# Patient Record
Sex: Female | Born: 1935 | Race: Black or African American | Hispanic: No | Smoking: Former smoker
Health system: Southern US, Community
[De-identification: ages and names within clinical notes are randomized; demographics above are authoritative.]

## PROBLEM LIST (undated history)

## (undated) DIAGNOSIS — H547 Unspecified visual loss: Secondary | ICD-10-CM

## (undated) DIAGNOSIS — H269 Unspecified cataract: Secondary | ICD-10-CM

## (undated) DIAGNOSIS — T7840XA Allergy, unspecified, initial encounter: Secondary | ICD-10-CM

## (undated) DIAGNOSIS — H401190 Primary open-angle glaucoma, unspecified eye, stage unspecified: Secondary | ICD-10-CM

## (undated) DIAGNOSIS — N189 Chronic kidney disease, unspecified: Secondary | ICD-10-CM

## (undated) DIAGNOSIS — M7071 Other bursitis of hip, right hip: Secondary | ICD-10-CM

## (undated) DIAGNOSIS — E079 Disorder of thyroid, unspecified: Secondary | ICD-10-CM

## (undated) DIAGNOSIS — F419 Anxiety disorder, unspecified: Secondary | ICD-10-CM

## (undated) DIAGNOSIS — M1990 Unspecified osteoarthritis, unspecified site: Secondary | ICD-10-CM

## (undated) DIAGNOSIS — I129 Hypertensive chronic kidney disease with stage 1 through stage 4 chronic kidney disease, or unspecified chronic kidney disease: Secondary | ICD-10-CM

## (undated) DIAGNOSIS — I10 Essential (primary) hypertension: Secondary | ICD-10-CM

## (undated) DIAGNOSIS — L89623 Pressure ulcer of left heel, stage 3: Secondary | ICD-10-CM

## (undated) DIAGNOSIS — M109 Gout, unspecified: Secondary | ICD-10-CM

## (undated) DIAGNOSIS — M6281 Muscle weakness (generalized): Secondary | ICD-10-CM

## (undated) DIAGNOSIS — F329 Major depressive disorder, single episode, unspecified: Secondary | ICD-10-CM

## (undated) DIAGNOSIS — I872 Venous insufficiency (chronic) (peripheral): Secondary | ICD-10-CM

## (undated) DIAGNOSIS — R54 Age-related physical debility: Secondary | ICD-10-CM

## (undated) DIAGNOSIS — R2689 Other abnormalities of gait and mobility: Secondary | ICD-10-CM

## (undated) DIAGNOSIS — F0390 Unspecified dementia without behavioral disturbance: Secondary | ICD-10-CM

## (undated) DIAGNOSIS — E538 Deficiency of other specified B group vitamins: Secondary | ICD-10-CM

## (undated) DIAGNOSIS — E43 Unspecified severe protein-calorie malnutrition: Secondary | ICD-10-CM

## (undated) DIAGNOSIS — F32A Depression, unspecified: Secondary | ICD-10-CM

## (undated) DIAGNOSIS — E876 Hypokalemia: Secondary | ICD-10-CM

## (undated) DIAGNOSIS — Z7409 Other reduced mobility: Secondary | ICD-10-CM

## (undated) DIAGNOSIS — D649 Anemia, unspecified: Secondary | ICD-10-CM

## (undated) DIAGNOSIS — R413 Other amnesia: Secondary | ICD-10-CM

## (undated) DIAGNOSIS — Z9181 History of falling: Secondary | ICD-10-CM

## (undated) DIAGNOSIS — G709 Myoneural disorder, unspecified: Secondary | ICD-10-CM

## (undated) DIAGNOSIS — M8000XD Age-related osteoporosis with current pathological fracture, unspecified site, subsequent encounter for fracture with routine healing: Secondary | ICD-10-CM

## (undated) HISTORY — DX: Hypokalemia: E87.6

## (undated) HISTORY — DX: Unspecified dementia without behavioral disturbance: F03.90

## (undated) HISTORY — PX: PARTIAL HYSTERECTOMY: SHX80

## (undated) HISTORY — DX: Unspecified cataract: H26.9

## (undated) HISTORY — DX: Other bursitis of hip, right hip: M70.71

## (undated) HISTORY — DX: Deficiency of other specified B group vitamins: E53.8

## (undated) HISTORY — DX: Muscle weakness (generalized): M62.81

## (undated) HISTORY — DX: Disorder of thyroid, unspecified: E07.9

## (undated) HISTORY — DX: Primary open-angle glaucoma, unspecified eye, stage unspecified: H40.1190

## (undated) HISTORY — DX: Age-related physical debility: R54

## (undated) HISTORY — DX: Myoneural disorder, unspecified: G70.9

## (undated) HISTORY — DX: Depression, unspecified: F32.A

## (undated) HISTORY — DX: History of falling: Z91.81

## (undated) HISTORY — DX: Hypomagnesemia: E83.42

## (undated) HISTORY — DX: Gout, unspecified: M10.9

## (undated) HISTORY — DX: Anxiety disorder, unspecified: F41.9

## (undated) HISTORY — DX: Unspecified osteoarthritis, unspecified site: M19.90

## (undated) HISTORY — DX: Hypertensive chronic kidney disease with stage 1 through stage 4 chronic kidney disease, or unspecified chronic kidney disease: I12.9

## (undated) HISTORY — DX: Unspecified visual loss: H54.7

## (undated) HISTORY — DX: Other reduced mobility: Z74.09

## (undated) HISTORY — DX: Venous insufficiency (chronic) (peripheral): I87.2

## (undated) HISTORY — DX: Unspecified severe protein-calorie malnutrition: E43

## (undated) HISTORY — DX: Pressure ulcer of left heel, stage 3: L89.623

## (undated) HISTORY — DX: Major depressive disorder, single episode, unspecified: F32.9

## (undated) HISTORY — DX: Age-related osteoporosis with current pathological fracture, unspecified site, subsequent encounter for fracture with routine healing: M80.00XD

## (undated) HISTORY — DX: Anemia, unspecified: D64.9

## (undated) HISTORY — DX: Allergy, unspecified, initial encounter: T78.40XA

---

## 2010-03-06 ENCOUNTER — Ambulatory Visit: Payer: Self-pay | Admitting: Internal Medicine

## 2011-03-27 ENCOUNTER — Ambulatory Visit: Payer: Self-pay | Admitting: Internal Medicine

## 2012-06-01 ENCOUNTER — Ambulatory Visit: Payer: Self-pay | Admitting: Internal Medicine

## 2013-12-31 ENCOUNTER — Emergency Department: Payer: Self-pay | Admitting: Emergency Medicine

## 2014-11-29 ENCOUNTER — Ambulatory Visit: Payer: Self-pay | Admitting: Family Medicine

## 2014-11-29 DIAGNOSIS — I340 Nonrheumatic mitral (valve) insufficiency: Secondary | ICD-10-CM

## 2014-11-30 ENCOUNTER — Encounter: Payer: Self-pay | Admitting: Cardiovascular Disease

## 2014-12-18 ENCOUNTER — Ambulatory Visit: Payer: Self-pay | Admitting: Family Medicine

## 2015-02-05 ENCOUNTER — Ambulatory Visit: Payer: Self-pay | Admitting: Nephrology

## 2015-06-25 ENCOUNTER — Other Ambulatory Visit: Payer: Self-pay | Admitting: Family Medicine

## 2015-06-25 NOTE — Telephone Encounter (Signed)
Your patient 

## 2015-07-24 ENCOUNTER — Other Ambulatory Visit: Payer: Self-pay | Admitting: Family Medicine

## 2015-08-17 ENCOUNTER — Telehealth: Payer: Self-pay | Admitting: Family Medicine

## 2015-08-17 NOTE — Telephone Encounter (Signed)
Called patient, it was a mix up with the pharmacy.

## 2015-08-17 NOTE — Telephone Encounter (Signed)
Pt confused about medications, please call her.

## 2015-09-11 DIAGNOSIS — M1991 Primary osteoarthritis, unspecified site: Secondary | ICD-10-CM | POA: Insufficient documentation

## 2015-09-11 DIAGNOSIS — R296 Repeated falls: Secondary | ICD-10-CM | POA: Insufficient documentation

## 2015-09-11 DIAGNOSIS — H401190 Primary open-angle glaucoma, unspecified eye, stage unspecified: Secondary | ICD-10-CM

## 2015-09-11 DIAGNOSIS — R634 Abnormal weight loss: Secondary | ICD-10-CM | POA: Insufficient documentation

## 2015-09-11 DIAGNOSIS — M1A0710 Idiopathic chronic gout, right ankle and foot, without tophus (tophi): Secondary | ICD-10-CM | POA: Insufficient documentation

## 2015-09-11 DIAGNOSIS — H9311 Tinnitus, right ear: Secondary | ICD-10-CM | POA: Insufficient documentation

## 2015-09-11 DIAGNOSIS — I10 Essential (primary) hypertension: Secondary | ICD-10-CM | POA: Insufficient documentation

## 2015-09-11 DIAGNOSIS — H269 Unspecified cataract: Secondary | ICD-10-CM | POA: Insufficient documentation

## 2015-09-11 DIAGNOSIS — I872 Venous insufficiency (chronic) (peripheral): Secondary | ICD-10-CM | POA: Insufficient documentation

## 2015-09-11 HISTORY — DX: Primary open-angle glaucoma, unspecified eye, stage unspecified: H40.1190

## 2016-03-24 ENCOUNTER — Other Ambulatory Visit: Payer: Self-pay | Admitting: Family Medicine

## 2016-03-24 NOTE — Telephone Encounter (Signed)
Needs follow up appointment.  

## 2016-03-24 NOTE — Telephone Encounter (Signed)
rx

## 2016-03-27 NOTE — Telephone Encounter (Signed)
Spoke with pt and she has to speak with her transportation but will call us back to schedule appt.

## 2016-05-17 ENCOUNTER — Other Ambulatory Visit: Payer: Self-pay | Admitting: Family Medicine

## 2016-05-19 NOTE — Telephone Encounter (Signed)
Patient needs appointment please.  °

## 2016-05-21 NOTE — Telephone Encounter (Signed)
Spoke with pt and she stated that she will call us back to schedule an appointment after she speaks with her transportation.

## 2017-01-18 DIAGNOSIS — N182 Chronic kidney disease, stage 2 (mild): Secondary | ICD-10-CM | POA: Insufficient documentation

## 2017-06-09 ENCOUNTER — Emergency Department: Payer: Medicare Other

## 2017-06-09 ENCOUNTER — Encounter: Payer: Self-pay | Admitting: Emergency Medicine

## 2017-06-09 ENCOUNTER — Emergency Department
Admission: EM | Admit: 2017-06-09 | Discharge: 2017-06-09 | Disposition: A | Discharge status: Home / Self Care | Payer: Medicare Other | Attending: Emergency Medicine | Admitting: Emergency Medicine

## 2017-06-09 DIAGNOSIS — N390 Urinary tract infection, site not specified: Secondary | ICD-10-CM | POA: Diagnosis not present

## 2017-06-09 DIAGNOSIS — N289 Disorder of kidney and ureter, unspecified: Secondary | ICD-10-CM | POA: Insufficient documentation

## 2017-06-09 DIAGNOSIS — Z87891 Personal history of nicotine dependence: Secondary | ICD-10-CM | POA: Diagnosis not present

## 2017-06-09 DIAGNOSIS — Z7982 Long term (current) use of aspirin: Secondary | ICD-10-CM | POA: Insufficient documentation

## 2017-06-09 DIAGNOSIS — I10 Essential (primary) hypertension: Secondary | ICD-10-CM | POA: Diagnosis not present

## 2017-06-09 DIAGNOSIS — M25551 Pain in right hip: Secondary | ICD-10-CM

## 2017-06-09 DIAGNOSIS — Z79899 Other long term (current) drug therapy: Secondary | ICD-10-CM | POA: Diagnosis not present

## 2017-06-09 DIAGNOSIS — R600 Localized edema: Secondary | ICD-10-CM | POA: Diagnosis not present

## 2017-06-09 HISTORY — DX: Essential (primary) hypertension: I10

## 2017-06-09 LAB — BASIC METABOLIC PANEL
ANION GAP: 11 (ref 5–15)
BUN: 24 mg/dL — ABNORMAL HIGH (ref 6–20)
CALCIUM: 9.6 mg/dL (ref 8.9–10.3)
CO2: 23 mmol/L (ref 22–32)
CREATININE: 1.87 mg/dL — AB (ref 0.44–1.00)
Chloride: 110 mmol/L (ref 101–111)
GFR calc non Af Amer: 23 mL/min — ABNORMAL LOW (ref >60)
GFR, EST AFRICAN AMERICAN: 27 mL/min — AB (ref >60)
Glucose, Bld: 109 mg/dL — ABNORMAL HIGH (ref 65–99)
Potassium: 3.5 mmol/L (ref 3.5–5.1)
SODIUM: 144 mmol/L (ref 135–145)

## 2017-06-09 LAB — URINALYSIS, COMPLETE (UACMP) WITH MICROSCOPIC
Bilirubin Urine: NEGATIVE
GLUCOSE, UA: NEGATIVE mg/dL
HGB URINE DIPSTICK: NEGATIVE
KETONES UR: NEGATIVE mg/dL
Leukocytes, UA: NEGATIVE
NITRITE: NEGATIVE
Protein, ur: NEGATIVE mg/dL
RBC / HPF: NONE SEEN RBC/hpf (ref 0–5)
Specific Gravity, Urine: 1.008 (ref 1.005–1.030)
pH: 5 (ref 5.0–8.0)

## 2017-06-09 LAB — CBC
HCT: 38.7 % (ref 35.0–47.0)
HEMOGLOBIN: 13 g/dL (ref 12.0–16.0)
MCH: 35.6 pg — ABNORMAL HIGH (ref 26.0–34.0)
MCHC: 33.7 g/dL (ref 32.0–36.0)
MCV: 105.7 fL — ABNORMAL HIGH (ref 80.0–100.0)
PLATELETS: 205 10*3/uL (ref 150–440)
RBC: 3.66 MIL/uL — ABNORMAL LOW (ref 3.80–5.20)
RDW: 16.9 % — ABNORMAL HIGH (ref 11.5–14.5)
WBC: 9.9 10*3/uL (ref 3.6–11.0)

## 2017-06-09 LAB — BRAIN NATRIURETIC PEPTIDE: B NATRIURETIC PEPTIDE 5: 70 pg/mL (ref 0.0–100.0)

## 2017-06-09 MED ORDER — ACETAMINOPHEN 500 MG PO TABS
1000.0000 mg | ORAL_TABLET | Freq: Once | ORAL | Status: AC
  Administered 2017-06-09: 1000 mg via ORAL
  Filled 2017-06-09: qty 2

## 2017-06-09 MED ORDER — CEPHALEXIN 500 MG PO CAPS: 500.0000 mg | ORAL_CAPSULE | Freq: Four times a day (QID) | ORAL | 0 refills | Status: AC

## 2017-06-09 MED ORDER — CEPHALEXIN 500 MG PO CAPS
500.0000 mg | ORAL_CAPSULE | Freq: Once | ORAL | Status: AC
  Administered 2017-06-09: 500 mg via ORAL
  Filled 2017-06-09: qty 1

## 2017-06-09 NOTE — ED Provider Notes (Signed)
[facility] Emergency Department Provider Note  ____________________________________________  Time seen: Approximately 12:46 PM  I have reviewed the triage vital signs and the nursing notes.   HISTORY  Chief Complaint Hip Pain and Leg Swelling    HPI [name] is a 83 y.o. female with a history of hypertension presenting with right hip pain, bilateral lower extremity swelling, and urinary frequency. The patient reports that for the past several months, she has had daily bilateral symmetric swelling of the legs without any calf pain. She has not hurt her primary care physician about this. She does occasionally use compression stockings which seems to help. For the past 5 days, the patient has had pain in the right hip which is worse when she walks. She has not tried any medication for pain. She has not had any trauma or fall. She has no numbness tingling or weakness. The patient also reports urinary frequency without dysuria which occurs only at night. No systemic symptoms including fever, chills, nausea or vomiting.   Past Medical History:  Diagnosis Date  . Hypertension     There are no active problems to display for this patient.   Past Surgical History:  Procedure Laterality Date  . ABDOMINAL HYSTERECTOMY      Current Outpatient Rx  . Order #: [id number] Class: Historical Med  . Order #: [id number] Class: Historical Med  . Order #: [id number] Class: Historical Med  . Order #: [id number] Class: Historical Med  . Order #: [id number] Class: Historical Med  . Order #: [id number] Class: Historical Med  . Order #: [id number] Class: Print  . Order #: [id number] Class: Normal    Allergies Patient has no known allergies.  No family history on file.  Social History Social History  Substance Use Topics  . Smoking status: Former Games developer  . Smokeless tobacco: Never Used  . Alcohol use Not on file    Review of Systems Constitutional: No fever/chills.No  lightheadedness or syncope. No trauma. No falls. Eyes: No visual changes. No blurred or double vision. ENT: No sore throat. No congestion or rhinorrhea. Cardiovascular: Denies chest pain. Denies palpitations. Respiratory: Denies shortness of breath.  No cough. Gastrointestinal: No abdominal pain.  No nausea, no vomiting.  No diarrhea.  No constipation. Genitourinary: Negative for dysuria.  Positive urinary frequency. Musculoskeletal: Negative for back pain. Bilateral symmetric lower extremity swelling for several months. Right hip pain without trauma. Skin: Negative for rash. Neurological: Negative for headaches. No focal numbness, tingling or weakness.     ____________________________________________   PHYSICAL EXAM:  VITAL SIGNS: ED Triage Vitals  Enc Vitals Group     BP 06/09/17 1133 (!) 183/72     Pulse Rate 06/09/17 1133 (!) 106     Resp 06/09/17 1133 20     Temp 06/09/17 1133 98.8 F (37.1 C)     Temp Source 06/09/17 1133 Oral     SpO2 06/09/17 1133 98 %     Weight 06/09/17 1133 116 lb (52.6 kg)     Height --      Head Circumference --      Peak Flow --      Pain Score 06/09/17 1135 7     Pain Loc --      Pain Edu? --      Excl. in GC? --     Constitutional: Alert and oriented. Well appearing and in no acute distress. Answers questions appropriately. Eyes: Conjunctivae are normal.  EOMI. No scleral icterus. Head: Atraumatic. Nose: No congestion/rhinnorhea. Mouth/Throat: Mucous membranes are  moist.  Neck: No stridor.  Supple.  No JVD. No meningismus. Cardiovascular: Normal rate, regular rhythm. No murmurs, rubs or gallops.  Respiratory: Normal respiratory effort.  No accessory muscle use or retractions. Lungs CTAB.  No wheezes, rales or ronchi. Gastrointestinal: Soft, nontender and nondistended.  No guarding or rebound.  No peritoneal signs. Musculoskeletal: Positive bilateral lower extremity edema that is symmetric and pitting to the occipital tibial shaft.  Positive tenderness to palpation over the right greater trochanter but full range of motion without significant pain of the right hip, right knee, and right ankle.. No ttp in the calves or palpable cords.  Negative Homan's sign. Neurologic:  A&Ox3.  Speech is clear.  Face and smile are symmetric.  EOMI.  Moves all extremities well. Skin:  Skin is warm, dry and intact. No rash noted. Psychiatric: Mood and affect are normal. Speech and behavior are normal.  Normal judgement.  ____________________________________________   LABS (all labs ordered are listed, but only abnormal results are displayed)  Labs Reviewed  URINALYSIS, COMPLETE (UACMP) WITH MICROSCOPIC - Abnormal; Notable for the following:       Result Value   Color, Urine YELLOW (*)    APPearance CLEAR (*)    Bacteria, UA RARE (*)    Squamous Epithelial / LPF 0-5 (*)    All other components within normal limits  BASIC METABOLIC PANEL - Abnormal; Notable for the following:    Glucose, Bld 109 (*)    BUN 24 (*)    Creatinine, Ser 1.87 (*)    GFR calc non Af Amer 23 (*)    GFR calc Af Amer 27 (*)    All other components within normal limits  CBC - Abnormal; Notable for the following:    RBC 3.66 (*)    MCV 105.7 (*)    MCH 35.6 (*)    RDW 16.9 (*)    All other components within normal limits  URINE CULTURE  BRAIN NATRIURETIC PEPTIDE   ____________________________________________  EKG  Not indicated ____________________________________________  RADIOLOGY  Dg Hip Unilat W Or Wo Pelvis 2-3 Views Right  Result Date: 06/09/2017 CLINICAL DATA:  83 year old female with right hip pain and bilateral leg swelling for some time. Denies injury. Initial encounter. EXAM: DG HIP (WITH OR WITHOUT PELVIS) 2-3V RIGHT COMPARISON:  None. FINDINGS: Mild bilateral hip joint degenerative changes. No fracture or plain film evidence of femoral head avascular necrosis. Degenerative changes lower lumbar spine incompletely assessed. Vascular  calcifications. IMPRESSION: Mild bilateral hip joint degenerative changes without acute abnormality noted. Degenerative changes lower lumbar spine. Electronically Signed   By: Lacy Duverney M.D.   On: 06/09/2017 12:34    ____________________________________________   PROCEDURES  Procedure(s) performed: None  Procedures  Critical Care performed: No ____________________________________________   INITIAL IMPRESSION / ASSESSMENT AND PLAN / ED COURSE  Pertinent labs & imaging results that were available during my care of the patient were reviewed by me and considered in my medical decision making (see chart for details).  83 y.o. female presenting with months of bilateral lower extremity swelling that is not worse, without pain, right hip pain for 5 days but still able to stimulate, and urinary frequency at night. Overall, the patient is mildly hypertensive and mildly tachycardic but afebrile. We'll get a x-ray of the right hip, basic labs, UA, and reevaluate the patient for final disposition. I will initiate symptomatic treatment.  ----------------------------------------- 2:01 PM on 06/09/2017 -----------------------------------------  The patient does have some mild renal insufficiency, which may  be contributing to her peripheral edema. Her peripheral edema may also be due to vascular insufficiency that is age-related. The patient does not have any evidence of fracture in her hip and she is ambulatory on this hip, so further evaluation with CT scan is not indicated at this time. I will ask her to treat her pain symptomatically. She does have some rare bacteria in her urine, and while she has no other significant signs of UTI, I'll treat her with a 5 day course of Keflex because she has been exhibiting urinary frequency. A urine culture has been sent At this time, the patient is safe for discharge home. Return precautions as well as follow-up instructions were  discussed. ____________________________________________  FINAL CLINICAL IMPRESSION(S) / ED DIAGNOSES  Final diagnoses:  Right hip pain  Renal insufficiency  Lower extremity edema  Urinary tract infection without hematuria, site unspecified         NEW MEDICATIONS STARTED DURING THIS VISIT:  New Prescriptions   CEPHALEXIN (KEFLEX) 500 MG CAPSULE    Take 1 capsule (500 mg total) by mouth 4 (four) times daily.      Rockne Menghini, MD 06/09/17 [phone]

## 2017-06-09 NOTE — ED Notes (Signed)
Patient transported to ultrasound.

## 2017-06-09 NOTE — ED Notes (Signed)
Signature pad on WOW not working. Patient verbalized understanding of discharge instructions and followup care. Patient to lobby in wheelchair with family. NAD noted.

## 2017-06-09 NOTE — Discharge Instructions (Signed)
Please drink plenty of fluid to stay well hydrated. Take the entire course of antibiotics for your urinary tract infection, even if you're feeling better.  The swelling in your feet, you may use compression stockings during the day, and elevate your legs as much as possible during the day and at night when you're sleeping.  Please make an appointment with your primary care doctor to have your kidney function rechecked and to be reevaluated for your symptoms.  Return to the emergency department if you develop severe pain, chest pain, shortness of breath, fever, or any other symptoms concerning to you.

## 2017-06-09 NOTE — ED Triage Notes (Addendum)
Pt to ed with c/o right hip pain and bilateral leg swelling for some time. Pt PCP in [facility] but does  Not see on regular basis. Pt denies any other symptoms. NAD. Pt with hx of leg swelling and right hip and knee pain, denies recent falls. Pt just wants an xray of her right hip.

## 2017-06-11 LAB — URINE CULTURE

## 2018-01-06 DIAGNOSIS — M533 Sacrococcygeal disorders, not elsewhere classified: Secondary | ICD-10-CM | POA: Insufficient documentation

## 2018-01-13 ENCOUNTER — Emergency Department: Payer: Medicare Other

## 2018-01-13 ENCOUNTER — Other Ambulatory Visit: Payer: Self-pay

## 2018-01-13 ENCOUNTER — Inpatient Hospital Stay
Admission: EM | Admit: 2018-01-13 | Discharge: 2018-01-17 | DRG: 563 | Disposition: A | Discharge status: Skilled Nursing Facility | Payer: Medicare Other | Attending: Internal Medicine | Admitting: Internal Medicine

## 2018-01-13 DIAGNOSIS — S82191A Other fracture of upper end of right tibia, initial encounter for closed fracture: Principal | ICD-10-CM | POA: Diagnosis present

## 2018-01-13 DIAGNOSIS — W1830XA Fall on same level, unspecified, initial encounter: Secondary | ICD-10-CM | POA: Diagnosis present

## 2018-01-13 DIAGNOSIS — M810 Age-related osteoporosis without current pathological fracture: Secondary | ICD-10-CM | POA: Diagnosis present

## 2018-01-13 DIAGNOSIS — S82401D Unspecified fracture of shaft of right fibula, subsequent encounter for closed fracture with routine healing: Secondary | ICD-10-CM

## 2018-01-13 DIAGNOSIS — Z90710 Acquired absence of both cervix and uterus: Secondary | ICD-10-CM

## 2018-01-13 DIAGNOSIS — M6282 Rhabdomyolysis: Secondary | ICD-10-CM | POA: Diagnosis present

## 2018-01-13 DIAGNOSIS — Z7983 Long term (current) use of bisphosphonates: Secondary | ICD-10-CM

## 2018-01-13 DIAGNOSIS — R296 Repeated falls: Secondary | ICD-10-CM | POA: Diagnosis present

## 2018-01-13 DIAGNOSIS — Z87891 Personal history of nicotine dependence: Secondary | ICD-10-CM | POA: Diagnosis not present

## 2018-01-13 DIAGNOSIS — F0390 Unspecified dementia without behavioral disturbance: Secondary | ICD-10-CM | POA: Diagnosis present

## 2018-01-13 DIAGNOSIS — N179 Acute kidney failure, unspecified: Secondary | ICD-10-CM | POA: Diagnosis present

## 2018-01-13 DIAGNOSIS — Z7982 Long term (current) use of aspirin: Secondary | ICD-10-CM | POA: Diagnosis not present

## 2018-01-13 DIAGNOSIS — Z79899 Other long term (current) drug therapy: Secondary | ICD-10-CM | POA: Diagnosis not present

## 2018-01-13 DIAGNOSIS — Z841 Family history of disorders of kidney and ureter: Secondary | ICD-10-CM | POA: Diagnosis not present

## 2018-01-13 DIAGNOSIS — W19XXXA Unspecified fall, initial encounter: Secondary | ICD-10-CM

## 2018-01-13 DIAGNOSIS — S82101A Unspecified fracture of upper end of right tibia, initial encounter for closed fracture: Secondary | ICD-10-CM | POA: Diagnosis present

## 2018-01-13 DIAGNOSIS — D72829 Elevated white blood cell count, unspecified: Secondary | ICD-10-CM | POA: Diagnosis present

## 2018-01-13 DIAGNOSIS — Z66 Do not resuscitate: Secondary | ICD-10-CM | POA: Diagnosis present

## 2018-01-13 DIAGNOSIS — I129 Hypertensive chronic kidney disease with stage 1 through stage 4 chronic kidney disease, or unspecified chronic kidney disease: Secondary | ICD-10-CM | POA: Diagnosis present

## 2018-01-13 DIAGNOSIS — N183 Chronic kidney disease, stage 3 (moderate): Secondary | ICD-10-CM | POA: Diagnosis present

## 2018-01-13 DIAGNOSIS — R413 Other amnesia: Secondary | ICD-10-CM | POA: Diagnosis present

## 2018-01-13 DIAGNOSIS — I872 Venous insufficiency (chronic) (peripheral): Secondary | ICD-10-CM | POA: Diagnosis present

## 2018-01-13 DIAGNOSIS — S82831A Other fracture of upper and lower end of right fibula, initial encounter for closed fracture: Secondary | ICD-10-CM

## 2018-01-13 DIAGNOSIS — S82201D Unspecified fracture of shaft of right tibia, subsequent encounter for closed fracture with routine healing: Secondary | ICD-10-CM | POA: Diagnosis present

## 2018-01-13 HISTORY — DX: Other abnormalities of gait and mobility: R26.89

## 2018-01-13 HISTORY — DX: Other amnesia: R41.3

## 2018-01-13 HISTORY — DX: Chronic kidney disease, unspecified: N18.9

## 2018-01-13 HISTORY — DX: Unspecified visual loss: H54.7

## 2018-01-13 LAB — CBC
HCT: 39.3 % (ref 35.0–47.0)
Hemoglobin: 12.8 g/dL (ref 12.0–16.0)
MCH: 33.7 pg (ref 26.0–34.0)
MCHC: 32.7 g/dL (ref 32.0–36.0)
MCV: 103.2 fL — ABNORMAL HIGH (ref 80.0–100.0)
PLATELETS: 292 10*3/uL (ref 150–440)
RBC: 3.81 MIL/uL (ref 3.80–5.20)
RDW: 15.5 % — ABNORMAL HIGH (ref 11.5–14.5)
WBC: 14.8 10*3/uL — ABNORMAL HIGH (ref 3.6–11.0)

## 2018-01-13 LAB — COMPREHENSIVE METABOLIC PANEL
ALK PHOS: 95 U/L (ref 38–126)
ALT: 17 U/L (ref 14–54)
AST: 52 U/L — ABNORMAL HIGH (ref 15–41)
Albumin: 3.4 g/dL — ABNORMAL LOW (ref 3.5–5.0)
Anion gap: 11 (ref 5–15)
BUN: 14 mg/dL (ref 6–20)
CALCIUM: 8.6 mg/dL — AB (ref 8.9–10.3)
CO2: 22 mmol/L (ref 22–32)
Chloride: 107 mmol/L (ref 101–111)
Creatinine, Ser: 1.41 mg/dL — ABNORMAL HIGH (ref 0.44–1.00)
GFR, EST AFRICAN AMERICAN: 37 mL/min — AB (ref >60)
GFR, EST NON AFRICAN AMERICAN: 32 mL/min — AB (ref >60)
Glucose, Bld: 105 mg/dL — ABNORMAL HIGH (ref 65–99)
Potassium: 4.1 mmol/L (ref 3.5–5.1)
SODIUM: 140 mmol/L (ref 135–145)
Total Bilirubin: 1.1 mg/dL (ref 0.3–1.2)
Total Protein: 6.2 g/dL — ABNORMAL LOW (ref 6.5–8.1)

## 2018-01-13 LAB — CK: Total CK: 1949 U/L — ABNORMAL HIGH (ref 38–234)

## 2018-01-13 MED ORDER — ACETAMINOPHEN 325 MG PO TABS: 650.0000 mg | ORAL_TABLET | Freq: Four times a day (QID) | ORAL | Status: DC | PRN

## 2018-01-13 MED ORDER — SODIUM CHLORIDE 0.9 % IV SOLN: Freq: Once | INTRAVENOUS | Status: DC

## 2018-01-13 MED ORDER — ONDANSETRON HCL 4 MG/2ML IJ SOLN: 4.0000 mg | Freq: Four times a day (QID) | INTRAMUSCULAR | Status: DC | PRN

## 2018-01-13 MED ORDER — ONDANSETRON HCL 4 MG PO TABS: 4.0000 mg | ORAL_TABLET | Freq: Four times a day (QID) | ORAL | Status: DC | PRN

## 2018-01-13 MED ORDER — SODIUM CHLORIDE 0.9 % IV BOLUS (SEPSIS)
1000.0000 mL | Freq: Once | INTRAVENOUS | Status: AC
  Administered 2018-01-13: 1000 mL via INTRAVENOUS

## 2018-01-13 MED ORDER — FENTANYL CITRATE (PF) 100 MCG/2ML IJ SOLN
50.0000 ug | Freq: Once | INTRAMUSCULAR | Status: AC
  Administered 2018-01-13: 50 ug via INTRAVENOUS
  Filled 2018-01-13: qty 2

## 2018-01-13 MED ORDER — ALLOPURINOL 100 MG PO TABS
100.0000 mg | ORAL_TABLET | Freq: Every day | ORAL | Status: DC
  Administered 2018-01-15 – 2018-01-17 (×3): 100 mg via ORAL
  Filled 2018-01-13 (×2): qty 1

## 2018-01-13 MED ORDER — OXYCODONE HCL 5 MG PO TABS
5.0000 mg | ORAL_TABLET | ORAL | Status: DC | PRN
  Administered 2018-01-14 – 2018-01-16 (×3): 5 mg via ORAL
  Filled 2018-01-13 (×3): qty 1

## 2018-01-13 MED ORDER — ACETAMINOPHEN 650 MG RE SUPP: 650.0000 mg | Freq: Four times a day (QID) | RECTAL | Status: DC | PRN

## 2018-01-13 MED ORDER — SODIUM CHLORIDE 0.9 % IV SOLN
INTRAVENOUS | Status: DC
  Administered 2018-01-13 – 2018-01-17 (×7): via INTRAVENOUS

## 2018-01-13 MED ORDER — MORPHINE SULFATE (PF) 2 MG/ML IV SOLN: 1.0000 mg | INTRAVENOUS | Status: DC | PRN

## 2018-01-13 MED ORDER — MIRTAZAPINE 15 MG PO TABS
15.0000 mg | ORAL_TABLET | Freq: Every day | ORAL | Status: DC
  Administered 2018-01-13 – 2018-01-16 (×4): 15 mg via ORAL
  Filled 2018-01-13 (×4): qty 1

## 2018-01-13 MED ORDER — METOPROLOL TARTRATE 50 MG PO TABS
50.0000 mg | ORAL_TABLET | Freq: Once | ORAL | Status: AC
  Administered 2018-01-13: 50 mg via ORAL
  Filled 2018-01-13: qty 1

## 2018-01-13 NOTE — ED Notes (Signed)
Green top recollected per lab

## 2018-01-13 NOTE — ED Notes (Signed)
Pt given supper tray.

## 2018-01-13 NOTE — H&P (Signed)
[facility] [place] at [facility]   PATIENT NAME: [name]    MR#:  [id number]  DATE OF BIRTH:  08-Oct-1926  DATE OF ADMISSION:  01/13/2018  PRIMARY CARE PHYSICIAN: Dr Javier Docker  REQUESTING/REFERRING PHYSICIAN: Dr Cecil Cobbs  CHIEF COMPLAINT:   Chief Complaint  Patient presents with  . Fall  . Knee Pain    HISTORY OF PRESENT ILLNESS:  [name]  is a 84 y.o. female with a known history of chronic kidney disease, hypertension.  She states that she was walking towards the refrigerator heard a crack and ended up on the floor.  This was 12 noon yesterday.  She crawled into a bedroom.  It was cold and she did not have much clothes on.  The patient's sister called last night to check up on her and the patient's husband said that she was sleeping.  This morning the patient's sister called and again and husband mentioned that she had a fall.  When she came over there and saw the patient on the ground, she called 911.  Patient having quite a bit of pain in the right leg and was found to have a right proximal tibia and fibula fracture.  Pain had improved after pain medication given.  Orthopedics was consulted from the emergency room.  PAST MEDICAL HISTORY:   Past Medical History:  Diagnosis Date  . Balance problems   . Chronic kidney disease   . Hypertension   . Memory loss   . Vision problems     PAST SURGICAL HISTORY:   Past Surgical History:  Procedure Laterality Date  . ABDOMINAL HYSTERECTOMY      SOCIAL HISTORY:   Social History   Tobacco Use  . Smoking status: Former Games developer  . Smokeless tobacco: Never Used  Substance Use Topics  . Alcohol use: No    Frequency: Never    FAMILY HISTORY:   Family History  Problem Relation Age of Onset  . CAD Mother   . Kidney disease Father   . CAD Father     DRUG ALLERGIES:  No Known Allergies  REVIEW OF SYSTEMS:  CONSTITUTIONAL: No fever.  Positive for chills.  Positive  for fatigue.  EYES: Wears glasses.  Decreased vision. EARS, NOSE, AND THROAT: No tinnitus or ear pain. No sore throat.  Positive for hearing loss.  Positive for pill dysphagia RESPIRATORY:  occasional cough no.  shortness of breath, wheezing or hemoptysis.  CARDIOVASCULAR: No chest pain, orthopnea, edema.  GASTROINTESTINAL: No nausea, vomiting, or abdominal pain. No blood in bowel movements.  On and off diarrhea GENITOURINARY: No dysuria, hematuria.  ENDOCRINE: No polyuria, nocturia,  HEMATOLOGY: No anemia, easy bruising or bleeding SKIN: Looks like petechial rash on arm and legs MUSCULOSKELETAL: Right leg pain NEUROLOGIC: No tingling, numbness, weakness.  PSYCHIATRY: No anxiety or depression.   MEDICATIONS AT HOME:   Prior to Admission medications   Medication Sig Start Date End Date Taking? Authorizing Provider  allopurinol (ZYLOPRIM) 100 MG tablet Take 100 mg by mouth daily. 05/22/17   [provider]  aspirin EC 81 MG tablet Take 81 mg by mouth daily.    [provider]  chlorthalidone (HYGROTON) 25 MG tablet Take 25 mg by mouth daily. 05/22/17   [provider]  ibandronate (BONIVA) 150 MG tablet Take 150 mg by mouth every 30 (thirty) days. 05/22/17   [provider]  mirtazapine (REMERON) 15 MG tablet Take 15 mg by mouth at bedtime. 05/22/17   [provider]  potassium chloride (K-DUR) 10 MEQ tablet Take 10 mEq by mouth daily.  05/22/17   [provider]      VITAL SIGNS:  Blood pressure (!) 184/95, pulse (!) 114, temperature 97.7 F (36.5 C), temperature source Oral, resp. rate (!) 22, height 5\' 4"  (1.626 m), weight 52.2 kg (115 lb), SpO2 100 %. Repeat blood pressure when I was in the room was 115/83 PHYSICAL EXAMINATION:  GENERAL:  84 y.o.-year-old patient lying in the bed with no acute distress.  EYES: Pupils equal, round, reactive to light and accommodation. No scleral icterus. Extraocular muscles intact.  HEENT: Head  atraumatic, normocephalic. Oropharynx and nasopharynx clear.  NECK:  Supple, no jugular venous distention. No thyroid enlargement, no tenderness.  LUNGS: Normal breath sounds bilaterally, no wheezing, rales,rhonchi or crepitation. No use of accessory muscles of respiration.  CARDIOVASCULAR: S1, S2 normal. No murmurs, rubs, or gallops.  ABDOMEN: Soft, nontender, nondistended. Bowel sounds present. No organomegaly or mass.  EXTREMITIES: No pedal edema, cyanosis, or clubbing.  NEUROLOGIC: Cranial nerves II through XII are intact.  able to flex and extend at the right ankle and wiggle toes.. Sensation intact. Gait not checked.  PSYCHIATRIC: The patient is alert and answers questions.  SKIN: Bruising over right side of the face-this looks old.  Bruise left shoulder.  Petechial rash on left lower extremity and  and left arm.  Right leg covered with immobilizer  LABORATORY PANEL:   CBC Recent Labs  Lab 01/13/18 1247  WBC 14.8*  HGB 12.8  HCT 39.3  PLT 292   ------------------------------------------------------------------------------------------------------------------  Chemistries  Recent Labs  Lab 01/13/18 1429  NA 140  K 4.1  CL 107  CO2 22  GLUCOSE 105*  BUN 14  CREATININE 1.41*  CALCIUM 8.6*  AST 52*  ALT 17  ALKPHOS 95  BILITOT 1.1   ------------------------------------------------------------------------------------------------------------------  Was RADIOLOGY:  Dg Tibia/fibula Right  Result Date: 01/13/2018 CLINICAL DATA:  Right knee pain and lower leg swelling since falling yesterday. EXAM: RIGHT TIBIA AND FIBULA - 2 VIEW COMPARISON:  None. FINDINGS: The bones are demineralized. There are acute, mildly displaced transverse fractures through the fibular head and proximal tibial metadiaphysis. No evidence of distal injury. There is generalized soft tissue edema throughout the lower leg. No evidence of foreign body. IMPRESSION: Mildly displaced extra-articular fractures  of the proximal right tibia and fibula. Electronically Signed   By: 01/15/2018 M.D.   On: 01/13/2018 14:07   Ct Head Wo Contrast  Result Date: 01/13/2018 CLINICAL DATA:  Patient found down after fall yesterday morning. Initial encounter. EXAM: CT HEAD WITHOUT CONTRAST TECHNIQUE: Contiguous axial images were obtained from the base of the skull through the vertex without intravenous contrast. COMPARISON:  None. FINDINGS: Brain: Mild atrophy and chronic microvascular ischemic change are seen. No evidence of acute abnormality including hemorrhage, infarct, mass lesion, mass effect, midline shift or abnormal extra-axial fluid collection. No hydrocephalus or pneumocephalus. Vascular: Atherosclerosis noted. Skull: Normal. Sinuses/Orbits: Normal. Other: None. IMPRESSION: No acute abnormality. Mild cortical atrophy. Atherosclerosis. Electronically Signed   By: 01/15/2018 M.D.   On: 01/13/2018 13:45   Dg Knee Complete 4 Views Right  Result Date: 01/13/2018 CLINICAL DATA:  Right knee pain and lower leg swelling since falling yesterday. EXAM: RIGHT KNEE - COMPLETE 4+ VIEW COMPARISON:  None. FINDINGS: The bones are demineralized. There are transverse mildly displaced fractures through the fibular head and the proximal tibial metadiaphysis. Neither fracture demonstrates definite intra-articular extension. There are mild tricompartmental  degenerative changes at the knee. There is only a small knee joint effusion. Femoropopliteal atherosclerosis, meniscal chondrocalcinosis and generalized soft tissue edema are noted. IMPRESSION: Mildly displaced transverse extra-articular fractures through the proximal right tibia and fibula as described. Electronically Signed   By: Carey Bullocks M.D.   On: 01/13/2018 14:06    EKG:   sinus tachycardia 113 bpm.  Lots of interference on this EKG.  Personally read by me  IMPRESSION AND PLAN:    1.  Preoperative evaluation for right tibiofibular fracture.  Orthopedic  surgery to see the patient and discuss medical management versus operative management.  No contraindications to surgery at this time if surgical management needed. 2.  Accelerated hypertension on presentation secondary to pain.  Patient was given pain medications and blood pressure came down.  Also given a dose of metoprolol.  I will hold the hygroton and continue low-dose metoprolol.  Continue to monitor vital signs every 4 hours while awake. 3.  Mild rhabdomyolysis from lying on the floor for 24 hours  After a fall.  Gentle IV fluid hydration.  Recheck CPK tomorrow morning. 4.  Chronic kidney disease stage III.  Monitor closely. 5.  Memory loss 6.  Patient will likely end up needing rehab.  Will have social worker look into home situation also. 7.  Leukocytosis likely secondary to fall and being on the floor for a while.  Urine analysis ordered  All the records are reviewed and case discussed with ED provider. Management plans discussed with the patient, family and they are in agreement.  CODE STATUS: DNR  TOTAL TIME TAKING CARE OF THIS PATIENT: 55 minutes.    Alford Highland M.D on 01/13/2018 at 3:56 PM  Between 7am to 6pm - Pager - [phone]  After 6pm call admission pager [phone]  [facility]  [phone]  CC: Primary care physician; Dr. Javier Docker

## 2018-01-13 NOTE — ED Notes (Signed)
Per pt she has "inadequate heat" - per family the house is "unliveable" - pt sister-in-law states that the spouse "knew she was in the floor all night because he told me this morning and he said that he had tried several times to get her up and couldn't and did not want to call EMS" pt sister-in-law also stated "when I got there this morning she was in the floor and cold because he had the heat on in his room but not hers, he didn't even take her any covers and just left her in the floor" sister-in-law states "the house is a mess and he will not even help her wash her clothes or cook her something to eat, she has to do everything and the house is a mess- She is not going back to that house if I have to take her home with me - that best thing that could happen to her is that they would take her and put her in a nursing home" - Dr Don Perking notified of this conversation
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
## 2018-01-13 NOTE — Progress Notes (Signed)
Pt admitted to room 159 from ED. Pt is A&Ox4, but forgetful. KI is in place to right leg. Pt oriented to room and plan of care. IV infusing, VSS. Pt states she is not in pain unless she is moving, and does not want pain medication at this time.   [place], Latricia Heft

## 2018-01-13 NOTE — ED Triage Notes (Signed)
Pt arrived via ems for c/o fall yesterday am - she has been in the floor since the time of the fall - c/o right knee pain - pt received 25mg  of fentanyl from ems

## 2018-01-13 NOTE — ED Notes (Addendum)
Pt voided in the bed - unable to collect urine sample at this time - pt request to wait until she has to urinate to collect sample - Dr Don Perking has been notified - placed external cath at this time - cleaned and dried pt

## 2018-01-14 LAB — BASIC METABOLIC PANEL
Anion gap: 7 (ref 5–15)
BUN: 16 mg/dL (ref 6–20)
CHLORIDE: 110 mmol/L (ref 101–111)
CO2: 24 mmol/L (ref 22–32)
CREATININE: 1.47 mg/dL — AB (ref 0.44–1.00)
Calcium: 8.4 mg/dL — ABNORMAL LOW (ref 8.9–10.3)
GFR calc Af Amer: 35 mL/min — ABNORMAL LOW (ref >60)
GFR calc non Af Amer: 30 mL/min — ABNORMAL LOW (ref >60)
GLUCOSE: 94 mg/dL (ref 65–99)
Potassium: 3.4 mmol/L — ABNORMAL LOW (ref 3.5–5.1)
Sodium: 141 mmol/L (ref 135–145)

## 2018-01-14 LAB — CBC
HCT: 32.8 % — ABNORMAL LOW (ref 35.0–47.0)
HEMOGLOBIN: 10.8 g/dL — AB (ref 12.0–16.0)
MCH: 33.7 pg (ref 26.0–34.0)
MCHC: 33 g/dL (ref 32.0–36.0)
MCV: 102.1 fL — AB (ref 80.0–100.0)
PLATELETS: 211 10*3/uL (ref 150–440)
RBC: 3.21 MIL/uL — ABNORMAL LOW (ref 3.80–5.20)
RDW: 14.5 % (ref 11.5–14.5)
WBC: 9.5 10*3/uL (ref 3.6–11.0)

## 2018-01-14 LAB — CK: Total CK: 2415 U/L — ABNORMAL HIGH (ref 38–234)

## 2018-01-14 MED ORDER — METOPROLOL TARTRATE 50 MG PO TABS
50.0000 mg | ORAL_TABLET | Freq: Two times a day (BID) | ORAL | Status: DC
  Administered 2018-01-14 – 2018-01-17 (×7): 50 mg via ORAL
  Filled 2018-01-14 (×7): qty 1

## 2018-01-14 MED ORDER — POTASSIUM CHLORIDE CRYS ER 20 MEQ PO TBCR
40.0000 meq | EXTENDED_RELEASE_TABLET | ORAL | Status: AC
  Administered 2018-01-14: 40 meq via ORAL
  Filled 2018-01-14: qty 2

## 2018-01-14 NOTE — Consult Note (Signed)
ORTHOPAEDIC CONSULTATION  REQUESTING PHYSICIAN: Katha Hamming, MD  Chief Complaint: Right leg pain  HPI: [name] is a 84 y.o. female who complains of  Right leg pain after a fall two nights ago.   She lay on the floor for over 12 hours before being brought to the ER. Exam and x-rays show a minimally displaced right proximal tibia/fibula fx.  Very osteopenic.  Knee immobilizer applied and admitted for care.  Past Medical History:  Diagnosis Date  . Balance problems   . Chronic kidney disease   . Hypertension   . Memory loss   . Vision problems    Past Surgical History:  Procedure Laterality Date  . ABDOMINAL HYSTERECTOMY     Social History   Socioeconomic History  . Marital status: Married    Spouse name: None  . Number of children: None  . Years of education: None  . Highest education level: None  Social Needs  . Financial resource strain: None  . Food insecurity - worry: None  . Food insecurity - inability: None  . Transportation needs - medical: None  . Transportation needs - non-medical: None  Occupational History  . None  Tobacco Use  . Smoking status: Former Games developer  . Smokeless tobacco: Never Used  Substance and Sexual Activity  . Alcohol use: No    Frequency: Never  . Drug use: No  . Sexual activity: None  Other Topics Concern  . None  Social History Narrative  . None   Family History  Problem Relation Age of Onset  . CAD Mother   . Kidney disease Father   . CAD Father    No Known Allergies Prior to Admission medications   Medication Sig Start Date End Date Taking? Authorizing Provider  allopurinol (ZYLOPRIM) 100 MG tablet Take 100 mg by mouth daily. 05/22/17  Yes [provider]  aspirin EC 81 MG tablet Take 81 mg by mouth daily.   Yes [provider]  mirtazapine (REMERON) 15 MG tablet Take 15 mg by mouth at bedtime. 05/22/17  Yes [provider]   Dg Tibia/fibula Right  Result Date:  01/13/2018 CLINICAL DATA:  Right knee pain and lower leg swelling since falling yesterday. EXAM: RIGHT TIBIA AND FIBULA - 2 VIEW COMPARISON:  None. FINDINGS: The bones are demineralized. There are acute, mildly displaced transverse fractures through the fibular head and proximal tibial metadiaphysis. No evidence of distal injury. There is generalized soft tissue edema throughout the lower leg. No evidence of foreign body. IMPRESSION: Mildly displaced extra-articular fractures of the proximal right tibia and fibula. Electronically Signed   By: Carey Bullocks M.D.   On: 01/13/2018 14:07   Ct Head Wo Contrast  Result Date: 01/13/2018 CLINICAL DATA:  Patient found down after fall yesterday morning. Initial encounter. EXAM: CT HEAD WITHOUT CONTRAST TECHNIQUE: Contiguous axial images were obtained from the base of the skull through the vertex without intravenous contrast. COMPARISON:  None. FINDINGS: Brain: Mild atrophy and chronic microvascular ischemic change are seen. No evidence of acute abnormality including hemorrhage, infarct, mass lesion, mass effect, midline shift or abnormal extra-axial fluid collection. No hydrocephalus or pneumocephalus. Vascular: Atherosclerosis noted. Skull: Normal. Sinuses/Orbits: Normal. Other: None. IMPRESSION: No acute abnormality. Mild cortical atrophy. Atherosclerosis. Electronically Signed   By: Drusilla Kanner M.D.   On: 01/13/2018 13:45   Dg Knee Complete 4 Views Right  Result Date: 01/13/2018 CLINICAL DATA:  Right knee pain and lower leg swelling since falling yesterday. EXAM: RIGHT KNEE -  COMPLETE 4+ VIEW COMPARISON:  None. FINDINGS: The bones are demineralized. There are transverse mildly displaced fractures through the fibular head and the proximal tibial metadiaphysis. Neither fracture demonstrates definite intra-articular extension. There are mild tricompartmental degenerative changes at the knee. There is only a small knee joint effusion. Femoropopliteal  atherosclerosis, meniscal chondrocalcinosis and generalized soft tissue edema are noted. IMPRESSION: Mildly displaced transverse extra-articular fractures through the proximal right tibia and fibula as described. Electronically Signed   By: Carey Bullocks M.D.   On: 01/13/2018 14:06    Positive ROS: All other systems have been reviewed and were otherwise negative with the exception of those mentioned in the HPI and as above.  Physical Exam: General: Alert, no acute distress Cardiovascular: No pedal edema Respiratory: No cyanosis, no use of accessory musculature GI: No organomegaly, abdomen is soft and non-tender Skin: No lesions in the area of chief complaint Neurologic: Sensation intact distally Psychiatric: Patient is competent for consent with normal mood and affect Lymphatic: No axillary or cervical lymphadenopathy  MUSCULOSKELETAL: Alert with knee immobilizer in place.  Swelling in right foot and ankle.  CSM good.  No other significant injury.   Assessment: Minimallly displaced proximal right tibia fx.   Plan: Continue non operative rx for now. Knee immobilizer. Will need SNF PT to mobilize, NWB right leg    Valinda Hoar, MD [phone]   01/14/2018 10:04 PM
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
## 2018-01-15 ENCOUNTER — Encounter
Admission: RE | Admit: 2018-01-15 | Discharge: 2018-01-15 | Disposition: A | Discharge status: Home / Self Care | Payer: Medicare Other | Source: Ambulatory Visit | Attending: Internal Medicine | Admitting: Internal Medicine

## 2018-01-15 LAB — COMPREHENSIVE METABOLIC PANEL
ALT: 21 U/L (ref 14–54)
AST: 59 U/L — AB (ref 15–41)
Albumin: 2.6 g/dL — ABNORMAL LOW (ref 3.5–5.0)
Alkaline Phosphatase: 62 U/L (ref 38–126)
Anion gap: 6 (ref 5–15)
BILIRUBIN TOTAL: 0.9 mg/dL (ref 0.3–1.2)
BUN: 13 mg/dL (ref 6–20)
CHLORIDE: 116 mmol/L — AB (ref 101–111)
CO2: 20 mmol/L — ABNORMAL LOW (ref 22–32)
CREATININE: 1.41 mg/dL — AB (ref 0.44–1.00)
Calcium: 8 mg/dL — ABNORMAL LOW (ref 8.9–10.3)
GFR calc Af Amer: 37 mL/min — ABNORMAL LOW (ref >60)
GFR, EST NON AFRICAN AMERICAN: 32 mL/min — AB (ref >60)
Glucose, Bld: 96 mg/dL (ref 65–99)
Potassium: 4 mmol/L (ref 3.5–5.1)
Sodium: 142 mmol/L (ref 135–145)
TOTAL PROTEIN: 4.8 g/dL — AB (ref 6.5–8.1)

## 2018-01-15 LAB — CK
Total CK: 1511 U/L — ABNORMAL HIGH (ref 38–234)
Total CK: 1133 U/L — ABNORMAL HIGH (ref 38–234)

## 2018-01-15 MED ORDER — OXYCODONE HCL 5 MG PO TABS: 5.0000 mg | ORAL_TABLET | ORAL | 0 refills | Status: DC | PRN

## 2018-01-15 MED ORDER — METOPROLOL TARTRATE 50 MG PO TABS: 50.0000 mg | ORAL_TABLET | Freq: Two times a day (BID) | ORAL | 0 refills | Status: DC

## 2018-01-15 NOTE — Clinical Social Work Note (Signed)
Clinical Social Work Assessment  Patient Details  Name: [name] MRN: [id number] Date of Birth: 01/31/1926  Date of referral:  01/15/18               Reason for consult:  Discharge Planning, Facility Placement                Permission sought to share information with:  Case Manager, Customer service manager, Family Supports Permission granted to share information::  Yes, Verbal Permission Granted  Name::        Agency::     Relationship::  Inez Catalina sister in Financial trader Information:     Housing/Transportation Living arrangements for the past 2 months:  Single Family Home Source of Information:  Patient, Medical Team, Other (Comment Required) Patient Interpreter Needed:  None Criminal Activity/Legal Involvement Pertinent to Current Situation/Hospitalization:  No - Comment as needed Significant Relationships:  Spouse, Other Family Members, Community Support Lives with:  Spouse Do you feel safe going back to the place where you live?  No Need for family participation in patient care:  Yes (Comment)  Care giving concerns:  Patient admitted from home with fall. Reports she remained on floor for 24 hours due to husband inability to pick her up. Patient unable to return home due to her needs to be cared for cannot be met and sister in law agreeable she would need SNF.  Sister in law would like [facility] if possible for placement   Social Worker assessment / plan:  LCSW completed consult for SNF placement Met with patient and spoke with sister in law via phone Both agreeable for SNF placement.  Spoke with [facility] which is first choice for family THey will start auth with hopes to get it back today.  LCSW will see if patient can DC to SNF today if medically stable.   Employment status:  Retired Nurse, adult PT Recommendations:  [place] / Referral to community resources:  [place]  Patient/Family's Response to care:  Agreeable to plan  Patient/Family's Understanding of and Emotional Response to Diagnosis, Current Treatment, and Prognosis:  Patient understands she cannot care for self and family member in home. Agreeable for SNF.  Emotional Assessment Appearance:  Appears stated age Attitude/Demeanor/Rapport:  Gracious Affect (typically observed):  Accepting, Adaptable Orientation:  Oriented to Self, Oriented to Place, Oriented to  Time, Oriented to Situation Alcohol / Substance use:  Not Applicable Psych involvement (Current and /or in the community):  No (Comment)  Discharge Needs  Concerns to be addressed:  Care Coordination Readmission within the last 30 days:  No Current discharge risk:  None Barriers to Discharge:  Ship broker, Continued Medical Work up   [place], LCSW 01/15/2018, 11:44 AM

## 2018-01-15 NOTE — Evaluation (Signed)
Physical Therapy Evaluation Patient Details Name: [name] MRN: [id number] DOB: 20-Nov-1926 Today's Date: 01/15/2018   History of Present Illness  Pt is an 84 y.o. female s/p mechanical fall (down for about 24 hours); imaging showing mildly displaced extra-articular fx's of proximal R tibia and fibula; also with mild rhabdomyolysis.  Ortho recommending conservative treatment and knee immobilizer.  PMH includes htn, LE venous insufficiency, recurrent falls, CKD, memory loss, vision problems, balance problems.  Clinical Impression  Prior to hospital admission, pt was independent with ambulation; h/o falls.  Pt lives with her husband in 1 level home with 4 steps to enter B railings.  Currently pt is 2 assist supine to sit, sit to stand with RW, and to perform stand pivot with RW bed to recliner.  Pt able to pivot on L LE fairly well using RW and 2 assist (forward flexed posture noted though--pt reporting this is her baseline posture in standing) and requiring intermittent vc's or assist for NWB'ing status R LE.  Pt initially with significant pain anytime moving R LE but pt did well (in terms of pain) pivoting to chair and pt reporting no pain end of session.  Pt would benefit from skilled PT to address noted impairments and functional limitations (see below for any additional details).  Upon hospital discharge, recommend pt discharge to STR.    Follow Up Recommendations SNF    Equipment Recommendations  Rolling walker with 5" wheels    Recommendations for Other Services       Precautions / Restrictions Precautions Precautions: Fall Required Braces or Orthoses: Knee Immobilizer - Right Knee Immobilizer - Right: On at all times Restrictions Weight Bearing Restrictions: Yes RLE Weight Bearing: Non weight bearing      Mobility  Bed Mobility Overal bed mobility: Needs Assistance Bed Mobility: Supine to Sit     Supine to sit: Min assist;Mod assist;+2 for physical assistance;HOB  elevated     General bed mobility comments: assist for R LE and trunk supine to sit; vc's for technique; pt able to scoot to edge of bed on own with vc's and assist for R LE  Transfers Overall transfer level: Needs assistance Equipment used: Rolling walker (2 wheeled) Transfers: Sit to/from Stand Sit to Stand: Mod assist;+2 physical assistance         General transfer comment: assist to initiate and come to standing with RW; forward flexed posture noted (pt reports she doesn't stand fully upright); intermittent assist for R LE NWB'ing status; able to pivot on L LE with RW bed to recliner with 2 assist  Ambulation/Gait             General Gait Details: pt unable to "hop" on L LE in order to advance L LE with use of RW and 2 assist  Stairs            Wheelchair Mobility    Modified Rankin (Stroke Patients Only)       Balance Overall balance assessment: Needs assistance Sitting-balance support: No upper extremity supported(single LE supported) Sitting balance-Leahy Scale: Poor Sitting balance - Comments: able to maintain static sitting with B UE support   Standing balance support: Bilateral upper extremity supported Standing balance-Leahy Scale: Poor Standing balance comment: requires B UE support on RW for static standing balance; forward flexed posture noted (pt reports this is baseline)  Pertinent Vitals/Pain Pain Assessment: 0-10 Pain Score: 0-No pain(pt reporting no pain at rest beginning and end of session but 8/10 with initial R LE movement with bed mobility) Pain Location: R knee Pain Descriptors / Indicators: Grimacing;Guarding;Sharp;Shooting Pain Intervention(s): Limited activity within patient's tolerance;Monitored during session;Premedicated before session;Repositioned  Vitals (HR and O2 on room air) stable and WFL throughout treatment session.    Home Living Family/patient expects to be discharged to:: Skilled  nursing facility Living Arrangements: Spouse/significant other   Type of Home: House Home Access: Stairs to enter Entrance Stairs-Rails: Right;Left;Can reach both Entrance Stairs-Number of Steps: 4 Home Layout: One level Home Equipment: Walker - 2 wheels;Toilet riser      Prior Function Level of Independence: Independent         Comments: pt independent with ambulation but reports h/o mechanical falls; pt reports husband with dementia     Hand Dominance        Extremity/Trunk Assessment   Upper Extremity Assessment Upper Extremity Assessment: Generalized weakness    Lower Extremity Assessment Lower Extremity Assessment: RLE deficits/detail(L LE generalized weakness) RLE Deficits / Details: able to perform R LE DF/PF AROM; R LE in KI RLE: Unable to fully assess due to pain;Unable to fully assess due to immobilization       Communication   Communication: No difficulties  Cognition Arousal/Alertness: Awake/alert Behavior During Therapy: WFL for tasks assessed/performed Overall Cognitive Status: Within Functional Limits for tasks assessed(pt reports h/o memory problems after hitting her head from a fall)                                 General Comments: R KI positioned too far down on R LE (R knee noted to be mildly flexed in bed); nursing assisted with repositioning R KI appropriately on R LE      General Comments   Nursing cleared pt for participation in physical therapy.  Pt agreeable to PT session.  End of session, pt reporting that the transfer to chair went a lot better than she expected and appeared happy with how the session went.    Exercises     Assessment/Plan    PT Assessment Patient needs continued PT services  PT Problem List Decreased strength;Decreased activity tolerance;Decreased balance;Decreased mobility;Decreased knowledge of use of DME;Decreased knowledge of precautions;Pain       PT Treatment Interventions DME  instruction;Gait training;Functional mobility training;Therapeutic activities;Therapeutic exercise;Stair training;Balance training;Patient/family education    PT Goals (Current goals can be found in the Care Plan section)  Acute Rehab PT Goals Patient Stated Goal: to improve mobility and have less pain PT Goal Formulation: With patient Time For Goal Achievement: 01/29/18 Potential to Achieve Goals: Good    Frequency 7X/week   Barriers to discharge Decreased caregiver support      Co-evaluation               AM-PAC PT "6 Clicks" Daily Activity  Outcome Measure Difficulty turning over in bed (including adjusting bedclothes, sheets and blankets)?: Unable Difficulty moving from lying on back to sitting on the side of the bed? : Unable Difficulty sitting down on and standing up from a chair with arms (e.g., wheelchair, bedside commode, etc,.)?: Unable Help needed moving to and from a bed to chair (including a wheelchair)?: Total Help needed walking in hospital room?: Total Help needed climbing 3-5 steps with a railing? : Total 6 Click Score: 6    End of  Session Equipment Utilized During Treatment: Gait belt;Right knee immobilizer Activity Tolerance: Patient limited by pain Patient left: in chair;with call bell/phone within reach;with chair alarm set;with SCD's reapplied(B heels elevated via pillow) Nurse Communication: Mobility status;Precautions;Weight bearing status(Nursing and nursing tech) PT Visit Diagnosis: Other abnormalities of gait and mobility (R26.89);Muscle weakness (generalized) (M62.81);History of falling (Z91.81);Difficulty in walking, not elsewhere classified (R26.2);Pain Pain - Right/Left: Right Pain - part of body: Leg    Time: 9735-3299 PT Time Calculation (min) (ACUTE ONLY): 41 min   Charges:   PT Evaluation $PT Eval Low Complexity: 1 Low PT Treatments $Therapeutic Activity: 23-37 mins   PT G CodesHendricks Limes, PT 01/15/18, 12:14  PM [phone]

## 2018-01-15 NOTE — NC FL2 (Signed)
[place] MEDICAID FL2 LEVEL OF CARE SCREENING TOOL     IDENTIFICATION  Patient Name: [name] Birthdate: May 27, 1926 Sex: female Admission Date (Current Location): 01/13/2018  [place] and [place] Number:  Chiropodist and Address:  [facility], [address] [place], [place] [address]      Provider Number: [id number]  Attending Physician Name and Address:  Katha Hamming, MD  Relative Name and Phone Number:       Current Level of Care: Hospital Recommended Level of Care: Skilled Nursing Facility Prior Approval Number:    Date Approved/Denied:   PASRR Number:   [id number]  Discharge Plan: SNF    Current Diagnoses: Patient Active Problem List   Diagnosis Date Noted  . Tibia/fibula fracture, right, closed, initial encounter 01/13/2018    Orientation RESPIRATION BLADDER Height & Weight     Self, Time, Situation, Place  Normal Incontinent Weight: 115 lb (52.2 kg) Height:  5\' 4"  (162.6 cm)  BEHAVIORAL SYMPTOMS/MOOD NEUROLOGICAL BOWEL NUTRITION STATUS      Incontinent Diet(See DC Summary)  AMBULATORY STATUS COMMUNICATION OF NEEDS Skin   Extensive Assist Verbally Normal                       Personal Care Assistance Level of Assistance  Bathing, Feeding, Dressing Bathing Assistance: Limited assistance Feeding assistance: Independent Dressing Assistance: Limited assistance     Functional Limitations Info  Sight, Hearing, Speech Sight Info: Adequate Hearing Info: Adequate Speech Info: Adequate    SPECIAL CARE FACTORS FREQUENCY  PT (By licensed PT), OT (By licensed OT)     PT Frequency: 5x OT Frequency: 5x            Contractures Contractures Info: Not present    Additional Factors Info  Code Status, Allergies Code Status Info: DNR Allergies Info: NKA           Current Medications (01/15/2018):  This is the current hospital active medication list Current Facility-Administered Medications   Medication Dose Route Frequency Provider Last Rate Last Dose  . 0.9 %  sodium chloride infusion   Intravenous Continuous 01/17/2018, MD 125 mL/hr at 01/15/18 0618    . acetaminophen (TYLENOL) tablet 650 mg  650 mg Oral Q6H PRN 01/17/18, MD       Or  . acetaminophen (TYLENOL) suppository 650 mg  650 mg Rectal Q6H PRN Wieting, Richard, MD      . allopurinol (ZYLOPRIM) tablet 100 mg  100 mg Oral Daily Alford Highland, MD   100 mg at 01/15/18 0858  . metoprolol tartrate (LOPRESSOR) tablet 50 mg  50 mg Oral BID 01/17/18, MD   50 mg at 01/15/18 0857  . mirtazapine (REMERON) tablet 15 mg  15 mg Oral QHS 01/17/18, MD   15 mg at 01/14/18 2107  . morphine 2 MG/ML injection 1 mg  1 mg Intravenous Q3H PRN 2108, MD      . ondansetron (ZOFRAN) tablet 4 mg  4 mg Oral Q6H PRN Wieting, Richard, MD       Or  . ondansetron (ZOFRAN) injection 4 mg  4 mg Intravenous Q6H PRN Wieting, Richard, MD      . oxyCODONE (Oxy IR/ROXICODONE) immediate release tablet 5 mg  5 mg Oral Q4H PRN Alford Highland, MD   5 mg at 01/15/18 01/17/18     Discharge Medications: Please see discharge summary for a list of discharge medications.  Relevant Imaging Results:  Relevant Lab  Results:   Additional Information SSN: [id number]  Raye Sorrow, [place]
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
## 2018-01-16 NOTE — Progress Notes (Signed)
Took over care of pt from [place], Charity fundraiser. Pt up in chair. IV infusing. Pt on room air. Pt denies pain. No other complaints at this time.

## 2018-01-16 NOTE — Progress Notes (Signed)
Physical Therapy Treatment Patient Details Name: [name] MRN: [id number] DOB: 1926/05/01 Today's Date: 01/16/2018    History of Present Illness Pt is an 84 y.o. female s/p mechanical fall (down for about 24 hours); imaging showing mildly displaced extra-articular fx's of proximal R tibia and fibula; also with mild rhabdomyolysis.  Ortho recommending conservative treatment and knee immobilizer.  PMH includes htn, LE venous insufficiency, recurrent falls, CKD, memory loss, vision problems, balance problems.    PT Comments    Split session so pt could receive pain medication before transfer. 9:25-9:39 Participated in exercises as described below.  Requested and received pain meds from nursing. 5/10  10:15-10:26 Pain 3/10 after medication.  To edge of bed with mod a x 2.  Sitting edge of bed with min assist without UE support.  Stood with mod a x 2 and was able to transfer to recliner at bedside with mod a x 2.  She does not stand fully with walker despite cues and she does try to sit before fully turned to recliner.  Overall does well maintaining WB status.   Follow Up Recommendations  SNF     Equipment Recommendations  Rolling walker with 5" wheels    Recommendations for Other Services       Precautions / Restrictions Precautions Precautions: Fall Required Braces or Orthoses: Knee Immobilizer - Right Knee Immobilizer - Right: On at all times Restrictions Weight Bearing Restrictions: Yes RLE Weight Bearing: Non weight bearing    Mobility  Bed Mobility Overal bed mobility: Needs Assistance Bed Mobility: Supine to Sit     Supine to sit: Mod assist;+2 for physical assistance;HOB elevated        Transfers Overall transfer level: Needs assistance Equipment used: Rolling walker (2 wheeled) Transfers: Sit to/from Stand Sit to Stand: Mod assist;+2 physical assistance         General transfer comment: assist for hand placeemnts,  flexed posture and tries to  sit before fully turned to recliner.  requires +2 assist for safety but overall does well with R NWB  Ambulation/Gait             General Gait Details: pt unable to "hop" on L LE in order to advance L LE with use of RW and 2 assist   Stairs            Wheelchair Mobility    Modified Rankin (Stroke Patients Only)       Balance Overall balance assessment: Needs assistance Sitting-balance support: No upper extremity supported Sitting balance-Leahy Scale: Poor Sitting balance - Comments: able to maintain static sitting with B UE support   Standing balance support: Bilateral upper extremity supported Standing balance-Leahy Scale: Poor Standing balance comment: requires B UE support on RW for static standing balance; forward flexed posture noted (pt reports this is baseline)                            Cognition Arousal/Alertness: Awake/alert Behavior During Therapy: WFL for tasks assessed/performed Overall Cognitive Status: Within Functional Limits for tasks assessed                                        Exercises  BLE ankle pumps, glut sets, SLR and ab/add, heel slides LLE only AROM LLE, AAROM RLE    General Comments        Pertinent  Vitals/Pain Pain Assessment: 0-10 Pain Score: 3  Pain Location: R knee - after pain meds Pain Descriptors / Indicators: Grimacing;Guarding;Sharp;Shooting Pain Intervention(s): Limited activity within patient's tolerance;Premedicated before session    Home Living                      Prior Function            PT Goals (current goals can now be found in the care plan section) Progress towards PT goals: Progressing toward goals    Frequency    7X/week      PT Plan Current plan remains appropriate    Co-evaluation              AM-PAC PT "6 Clicks" Daily Activity  Outcome Measure  Difficulty turning over in bed (including adjusting bedclothes, sheets and blankets)?:  Unable Difficulty moving from lying on back to sitting on the side of the bed? : Unable Difficulty sitting down on and standing up from a chair with arms (e.g., wheelchair, bedside commode, etc,.)?: Unable Help needed moving to and from a bed to chair (including a wheelchair)?: Total Help needed walking in hospital room?: Total Help needed climbing 3-5 steps with a railing? : Total 6 Click Score: 6    End of Session Equipment Utilized During Treatment: Gait belt;Right knee immobilizer Activity Tolerance: Patient limited by pain Patient left: in chair;with call bell/phone within reach;with chair alarm set Nurse Communication: Mobility status;Precautions;Weight bearing status Pain - Right/Left: Right Pain - part of body: Leg     Time: 1001-1026 PT Time Calculation (min) (ACUTE ONLY): 25 min  Charges:  $Therapeutic Exercise: 8-22 mins $Therapeutic Activity: 8-22 mins                    G Codes:       Danielle Dess, PTA 01/16/18, 10:43 AM

## 2018-01-16 NOTE — Plan of Care (Signed)
  Education: Knowledge of General Education information will improve 01/16/2018 0558 - Progressing by Jude Linck, Genelle Gather, RN   Health Behavior/Discharge Planning: Ability to manage health-related needs will improve 01/16/2018 0558 - Progressing by Aahna Rossa, Genelle Gather, RN   Clinical Measurements: Ability to maintain clinical measurements within normal limits will improve 01/16/2018 0558 - Progressing by Jacaria Colburn, Genelle Gather, RN Will remain free from infection 01/16/2018 0558 - Progressing by Tyquan Carmickle, Genelle Gather, RN Diagnostic test results will improve 01/16/2018 [phone] - Progressing by Nathaniel Wakeley, Genelle Gather, RN Respiratory complications will improve 01/16/2018 0558 - Progressing by Roxana Hires, RN Cardiovascular complication will be avoided 01/16/2018 0558 - Progressing by Wilver Tignor, Genelle Gather, RN   Activity: Risk for activity intolerance will decrease 01/16/2018 0558 - Progressing by Roxana Hires, RN   Nutrition: Adequate nutrition will be maintained 01/16/2018 0558 - Progressing by Roxana Hires, RN   Coping: Level of anxiety will decrease 01/16/2018 0558 - Progressing by Mylynn Dinh, Genelle Gather, RN

## 2018-01-17 LAB — BASIC METABOLIC PANEL
ANION GAP: 3 — AB (ref 5–15)
BUN: 12 mg/dL (ref 6–20)
CHLORIDE: 117 mmol/L — AB (ref 101–111)
CO2: 22 mmol/L (ref 22–32)
Calcium: 7.8 mg/dL — ABNORMAL LOW (ref 8.9–10.3)
Creatinine, Ser: 1.3 mg/dL — ABNORMAL HIGH (ref 0.44–1.00)
GFR calc Af Amer: 41 mL/min — ABNORMAL LOW (ref >60)
GFR, EST NON AFRICAN AMERICAN: 35 mL/min — AB (ref >60)
Glucose, Bld: 113 mg/dL — ABNORMAL HIGH (ref 65–99)
POTASSIUM: 4.1 mmol/L (ref 3.5–5.1)
Sodium: 142 mmol/L (ref 135–145)

## 2018-01-17 LAB — CK: Total CK: 268 U/L — ABNORMAL HIGH (ref 38–234)

## 2018-01-17 MED ORDER — DOCUSATE SODIUM 100 MG PO CAPS: 200.0000 mg | ORAL_CAPSULE | Freq: Two times a day (BID) | ORAL | 0 refills | Status: DC

## 2018-01-17 MED ORDER — IBUPROFEN 800 MG PO TABS: 800.0000 mg | ORAL_TABLET | Freq: Three times a day (TID) | ORAL | 0 refills | Status: DC | PRN

## 2018-01-17 MED ORDER — OXYCODONE HCL 5 MG PO TABS: 10.0000 mg | ORAL_TABLET | ORAL | 0 refills | Status: DC | PRN

## 2018-01-17 MED ORDER — ENOXAPARIN SODIUM 40 MG/0.4ML ~~LOC~~ SOLN: 40.0000 mg | SUBCUTANEOUS | Status: DC

## 2018-01-17 MED ORDER — DOCUSATE SODIUM 100 MG PO CAPS: 200.0000 mg | ORAL_CAPSULE | Freq: Two times a day (BID) | ORAL | Status: DC

## 2018-01-17 NOTE — Progress Notes (Signed)
EMS called for transport.

## 2018-01-17 NOTE — Clinical Social Work Note (Signed)
MD discharging patient today to [facility] CSW has notified Music therapist at [facility]. Patient is aware and states she will need to go by EMS. CSW contacted patient's sister: Blair Heys: [phone], and informed her of discharge. She is unable to transport. Patient will go via EMS. Nurse to call report then EMS. York Spaniel MSW,LCSW [phone]

## 2018-01-17 NOTE — Discharge Summary (Signed)
[facility] - [place] at [facility], 84 y.o., DOB 17-Dec-1925, MRN [id number]. Admission date: 01/13/2018 Discharge Date 01/17/2018 Primary MD System, Pcp Not In Admitting Physician Alford Highland, MD  Admission Diagnosis  Fall, initial encounter [W19.XXXA] Non-traumatic rhabdomyolysis [M62.82] Closed fracture of proximal end of right fibula, unspecified fracture morphology, initial encounter [S82.831A] Closed fracture of proximal end of right tibia, unspecified fracture morphology, initial encounter [S82.101A]  Discharge Diagnosis   Active Problems:  right  Tibia/fibula fracture, right, closed, initial encounter  acute kidney injury on CKD stage 3   Rhabdomyolysis Chronic memory loss Essential HTN        Hospital Course  [name]  is a 84 y.o. female with a known history of chronic kidney disease, hypertension.  She states that she was walking towards the refrigerator heard a crack and ended up on the floor. Patient was brought to ed and noted to have right tib/fib fracture. She was seen by ortho and told to have knee imoblizer. Pt was noted to have arf given ivf. Also had elevated CPK and was given IVF.  CpK close to normal now.  Lovenox 40mg  sq x 20 days              Consults  orthopedic surgery  Significant Tests:  See full reports for all details     Dg Tibia/fibula Right  Result Date: 01/13/2018 CLINICAL DATA:  Right knee pain and lower leg swelling since falling yesterday. EXAM: RIGHT TIBIA AND FIBULA - 2 VIEW COMPARISON:  None. FINDINGS: The bones are demineralized. There are acute, mildly displaced transverse fractures through the fibular head and proximal tibial metadiaphysis. No evidence of distal injury. There is generalized soft tissue edema throughout the lower leg. No evidence of foreign body. IMPRESSION: Mildly displaced extra-articular fractures of the proximal right tibia and fibula. Electronically Signed    By: 01/15/2018 M.D.   On: 01/13/2018 14:07   Ct Head Wo Contrast  Result Date: 01/13/2018 CLINICAL DATA:  Patient found down after fall yesterday morning. Initial encounter. EXAM: CT HEAD WITHOUT CONTRAST TECHNIQUE: Contiguous axial images were obtained from the base of the skull through the vertex without intravenous contrast. COMPARISON:  None. FINDINGS: Brain: Mild atrophy and chronic microvascular ischemic change are seen. No evidence of acute abnormality including hemorrhage, infarct, mass lesion, mass effect, midline shift or abnormal extra-axial fluid collection. No hydrocephalus or pneumocephalus. Vascular: Atherosclerosis noted. Skull: Normal. Sinuses/Orbits: Normal. Other: None. IMPRESSION: No acute abnormality. Mild cortical atrophy. Atherosclerosis. Electronically Signed   By: 01/15/2018 M.D.   On: 01/13/2018 13:45   Dg Knee Complete 4 Views Right  Result Date: 01/13/2018 CLINICAL DATA:  Right knee pain and lower leg swelling since falling yesterday. EXAM: RIGHT KNEE - COMPLETE 4+ VIEW COMPARISON:  None. FINDINGS: The bones are demineralized. There are transverse mildly displaced fractures through the fibular head and the proximal tibial metadiaphysis. Neither fracture demonstrates definite intra-articular extension. There are mild tricompartmental degenerative changes at the knee. There is only a small knee joint effusion. Femoropopliteal atherosclerosis, meniscal chondrocalcinosis and generalized soft tissue edema are noted. IMPRESSION: Mildly displaced transverse extra-articular fractures through the proximal right tibia and fibula as described. Electronically Signed   By: 01/15/2018 M.D.   On: 01/13/2018 14:06       Today   Subjective:   01/15/2018  Pt feel well denies any compaltins  Objective:   Blood pressure 136/69, pulse 97, temperature 98.7 F (37.1 C), temperature  source Oral, resp. rate 16, height 5\' 4"  (1.626 m), weight 115 lb (52.2 kg), SpO2 97 %.   .  Intake/Output Summary (Last 24 hours) at 01/17/2018 1344 Last data filed at 01/17/2018 1052 Gross per 24 hour  Intake 7932.5 ml  Output 400 ml  Net 7532.5 ml    Exam VITAL SIGNS: Blood pressure 136/69, pulse 97, temperature 98.7 F (37.1 C), temperature source Oral, resp. rate 16, height 5\' 4"  (1.626 m), weight 115 lb (52.2 kg), SpO2 97 %.  GENERAL:  84 y.o.-year-old patient lying in the bed with no acute distress.  EYES: Pupils equal, round, reactive to light and accommodation. No scleral icterus. Extraocular muscles intact.  HEENT: Head atraumatic, normocephalic. Oropharynx and nasopharynx clear.  NECK:  Supple, no jugular venous distention. No thyroid enlargement, no tenderness.  LUNGS: Normal breath sounds bilaterally, no wheezing, rales,rhonchi or crepitation. No use of accessory muscles of respiration.  CARDIOVASCULAR: S1, S2 normal. No murmurs, rubs, or gallops.  ABDOMEN: Soft, nontender, nondistended. Bowel sounds present. No organomegaly or mass.  EXTREMITIES: No pedal edema, cyanosis, or clubbing.  NEUROLOGIC: Cranial nerves II through XII are intact. Muscle strength 5/5 in all extremities. Sensation intact. Gait not checked.  PSYCHIATRIC: The patient is alert and oriented x 3.  SKIN: No obvious rash, lesion, or ulcer.   Data Review     CBC w Diff:  Lab Results  Component Value Date   WBC 9.5 01/14/2018   HGB 10.8 (L) 01/14/2018   HCT 32.8 (L) 01/14/2018   PLT 211 01/14/2018   CMP:  Lab Results  Component Value Date   NA 142 01/17/2018   K 4.1 01/17/2018   CL 117 (H) 01/17/2018   CO2 22 01/17/2018   BUN 12 01/17/2018   CREATININE 1.30 (H) 01/17/2018   PROT 4.8 (L) 01/15/2018   ALBUMIN 2.6 (L) 01/15/2018   BILITOT 0.9 01/15/2018   ALKPHOS 62 01/15/2018   AST 59 (H) 01/15/2018   ALT 21 01/15/2018  .  Micro Results No results found for this or any previous visit (from the past 240 hour(s)).      Code Status Orders  (From admission, onward)         Start     Ordered   01/13/18 1553  Do not attempt resuscitation (DNR)  Continuous    Question Answer Comment  In the event of cardiac or respiratory ARREST Do not call a "code blue"   In the event of cardiac or respiratory ARREST Do not perform Intubation, CPR, defibrillation or ACLS   In the event of cardiac or respiratory ARREST Use medication by any route, position, wound care, and other measures to relive pain and suffering. May use oxygen, suction and manual treatment of airway obstruction as needed for comfort.   Comments nurse may pronounce      01/13/18 1553    Code Status History    Date Active Date Inactive Code Status Order ID Comments User Context   This patient has a current code status but no historical code status.          Follow-up Information    Deeann Saint, MD Follow up in 2 week(s).   Specialty:  Orthopedic Surgery Contact information: [address] [place] [address] [phone]           Discharge Medications   Allergies as of 01/17/2018   No Known Allergies     Medication List    TAKE these medications   allopurinol 100  MG tablet Commonly known as:  ZYLOPRIM Take 100 mg by mouth daily.   aspirin EC 81 MG tablet Take 81 mg by mouth daily.   docusate sodium 100 MG capsule Commonly known as:  COLACE Take 2 capsules (200 mg total) by mouth 2 (two) times daily.   enoxaparin 40 MG/0.4ML injection Commonly known as:  LOVENOX Inject 0.4 mLs (40 mg total) into the skin daily for 20 days.   ibuprofen 800 MG tablet Commonly known as:  ADVIL,MOTRIN Take 1 tablet (800 mg total) by mouth every 8 (eight) hours as needed.   metoprolol tartrate 50 MG tablet Commonly known as:  LOPRESSOR Take 1 tablet (50 mg total) by mouth 2 (two) times daily.   mirtazapine 15 MG tablet Commonly known as:  REMERON Take 15 mg by mouth at bedtime.   oxyCODONE 5 MG immediate release tablet Commonly known as:  Oxy IR/ROXICODONE Take 2  tablets (10 mg total) by mouth every 4 (four) hours as needed for moderate pain.          Total Time in preparing paper work, data evaluation and todays exam - 35 minutes  Auburn Bilberry M.D on 01/17/2018 at 1:44 PM  [facility]   Office  [phone]

## 2018-01-17 NOTE — Progress Notes (Signed)
Report called and given to Rita at [facility].  

## 2018-01-17 NOTE — Clinical Social Work Placement (Signed)
   CLINICAL SOCIAL WORK PLACEMENT  NOTE  Date:  01/17/2018  Patient Details  Name: [name] MRN: [id number] Date of Birth: 10-19-1926  Clinical Social Work is seeking post-discharge placement for this patient at the Skilled  Nursing Facility level of care (*CSW will initial, date and re-position this form in  chart as items are completed):  Yes   Patient/family provided with [facility] Clinical Social Work Department's list of facilities offering this level of care within the geographic area requested by the patient (or if unable, by the patient's family).  Yes   Patient/family informed of their freedom to choose among providers that offer the needed level of care, that participate in Medicare, Medicaid or managed care program needed by the patient, have an available bed and are willing to accept the patient.  Yes   Patient/family informed of [facility]'s ownership interest in [facility] and [facility], as well as of the fact that they are under no obligation to receive care at these facilities.  PASRR submitted to EDS on 01/15/18     PASRR number received on 01/15/18     Existing PASRR number confirmed on       FL2 transmitted to all facilities in geographic area requested by pt/family on 01/15/18     FL2 transmitted to all facilities within larger geographic area on       Patient informed that his/her managed care company has contracts with or will negotiate with certain facilities, including the following:        Yes   Patient/family informed of bed offers received.  Patient chooses bed at [facility])     Physician recommends and patient chooses bed at [facility])    Patient to be transferred to [facility]) on 01/17/18.  Patient to be transferred to facility by (EMS)     Patient family notified on 01/17/18 of transfer.  Name of family member notified:  (sister: Kathie Rhodes)     PHYSICIAN Please sign FL2     Additional Comment:     _______________________________________________ York Spaniel, LCSW 01/17/2018, 2:24 PM

## 2018-01-17 NOTE — Discharge Instructions (Signed)
[facility] - [place] at [facility]  DIET:  cardiac  DISCHARGE CONDITION:  Stable  ACTIVITY:   right knee imbolizer, non weight bearing right leg  OXYGEN:  Home Oxygen: No.   Oxygen Delivery: room air  DISCHARGE LOCATION:  nursing home    ADDITIONAL DISCHARGE INSTRUCTION:   If you experience worsening of your admission symptoms, develop shortness of breath, life threatening emergency, suicidal or homicidal thoughts you must seek medical attention immediately by calling 911 or calling your MD immediately  if symptoms less severe.  You Must read complete instructions/literature along with all the possible adverse reactions/side effects for all the Medicines you take and that have been prescribed to you. Take any new Medicines after you have completely understood and accpet all the possible adverse reactions/side effects.   Please note  You were cared for by a hospitalist during your hospital stay. If you have any questions about your discharge medications or the care you received while you were in the hospital after you are discharged, you can call the unit and asked to speak with the hospitalist on call if the hospitalist that took care of you is not available. Once you are discharged, your primary care physician will handle any further medical issues. Please note that NO REFILLS for any discharge medications will be authorized once you are discharged, as it is imperative that you return to your primary care physician (or establish a relationship with a primary care physician if you do not have one) for your aftercare needs so that they can reassess your need for medications and monitor your lab values.

## 2018-01-18 DIAGNOSIS — M8000XD Age-related osteoporosis with current pathological fracture, unspecified site, subsequent encounter for fracture with routine healing: Secondary | ICD-10-CM | POA: Insufficient documentation

## 2018-01-18 DIAGNOSIS — F0390 Unspecified dementia without behavioral disturbance: Secondary | ICD-10-CM | POA: Insufficient documentation

## 2018-01-18 DIAGNOSIS — I129 Hypertensive chronic kidney disease with stage 1 through stage 4 chronic kidney disease, or unspecified chronic kidney disease: Secondary | ICD-10-CM

## 2018-01-18 HISTORY — DX: Hypertensive chronic kidney disease with stage 1 through stage 4 chronic kidney disease, or unspecified chronic kidney disease: I12.9

## 2018-01-18 HISTORY — DX: Age-related osteoporosis with current pathological fracture, unspecified site, subsequent encounter for fracture with routine healing: M80.00XD

## 2018-01-18 HISTORY — DX: Unspecified dementia, unspecified severity, without behavioral disturbance, psychotic disturbance, mood disturbance, and anxiety: F03.90

## 2018-01-19 ENCOUNTER — Other Ambulatory Visit: Payer: Self-pay

## 2018-01-19 MED ORDER — OXYCODONE HCL 5 MG PO TABS: 10.0000 mg | ORAL_TABLET | ORAL | 0 refills | Status: DC | PRN

## 2018-01-19 NOTE — Telephone Encounter (Signed)
Rx sent to [facility] phone : [phone] , fax : [phone]  

## 2018-01-22 ENCOUNTER — Encounter
Admission: RE | Admit: 2018-01-22 | Discharge: 2018-01-22 | Disposition: A | Discharge status: Home / Self Care | Payer: Medicare Other | Source: Ambulatory Visit | Attending: Internal Medicine | Admitting: Internal Medicine

## 2018-01-26 ENCOUNTER — Other Ambulatory Visit
Admission: RE | Admit: 2018-01-26 | Discharge: 2018-01-26 | Disposition: A | Discharge status: Home / Self Care | Payer: Medicare Other | Source: Ambulatory Visit | Attending: Gerontology | Admitting: Gerontology

## 2018-01-26 DIAGNOSIS — I129 Hypertensive chronic kidney disease with stage 1 through stage 4 chronic kidney disease, or unspecified chronic kidney disease: Secondary | ICD-10-CM | POA: Insufficient documentation

## 2018-01-26 LAB — COMPREHENSIVE METABOLIC PANEL
ALBUMIN: 2.4 g/dL — AB (ref 3.5–5.0)
ALK PHOS: 138 U/L — AB (ref 38–126)
ALT: 13 U/L — ABNORMAL LOW (ref 14–54)
ANION GAP: 9 (ref 5–15)
AST: 32 U/L (ref 15–41)
BILIRUBIN TOTAL: 0.9 mg/dL (ref 0.3–1.2)
BUN: 21 mg/dL — AB (ref 6–20)
CALCIUM: 8.5 mg/dL — AB (ref 8.9–10.3)
CO2: 22 mmol/L (ref 22–32)
Chloride: 113 mmol/L — ABNORMAL HIGH (ref 101–111)
Creatinine, Ser: 1.36 mg/dL — ABNORMAL HIGH (ref 0.44–1.00)
GFR calc Af Amer: 39 mL/min — ABNORMAL LOW (ref >60)
GFR calc non Af Amer: 33 mL/min — ABNORMAL LOW (ref >60)
GLUCOSE: 75 mg/dL (ref 65–99)
Potassium: 3.9 mmol/L (ref 3.5–5.1)
SODIUM: 144 mmol/L (ref 135–145)
TOTAL PROTEIN: 5.6 g/dL — AB (ref 6.5–8.1)

## 2018-01-26 LAB — LIPID PANEL
CHOLESTEROL: 109 mg/dL (ref 0–200)
HDL: 38 mg/dL — ABNORMAL LOW (ref >40)
LDL Cholesterol: 53 mg/dL (ref 0–99)
TRIGLYCERIDES: 90 mg/dL (ref <150)
Total CHOL/HDL Ratio: 2.9 RATIO
VLDL: 18 mg/dL (ref 0–40)

## 2018-01-26 LAB — CBC WITH DIFFERENTIAL/PLATELET
BASOS ABS: 0 10*3/uL (ref 0–0.1)
BASOS PCT: 0 %
Eosinophils Absolute: 0 10*3/uL (ref 0–0.7)
Eosinophils Relative: 0 %
HCT: 30.4 % — ABNORMAL LOW (ref 35.0–47.0)
Hemoglobin: 10.1 g/dL — ABNORMAL LOW (ref 12.0–16.0)
Lymphocytes Relative: 21 %
Lymphs Abs: 1.7 10*3/uL (ref 1.0–3.6)
MCH: 34.2 pg — AB (ref 26.0–34.0)
MCHC: 33.1 g/dL (ref 32.0–36.0)
MCV: 103.3 fL — ABNORMAL HIGH (ref 80.0–100.0)
MONO ABS: 0.9 10*3/uL (ref 0.2–0.9)
MONOS PCT: 11 %
NEUTROS ABS: 5.2 10*3/uL (ref 1.4–6.5)
Neutrophils Relative %: 68 %
Platelets: 311 10*3/uL (ref 150–440)
RBC: 2.94 MIL/uL — ABNORMAL LOW (ref 3.80–5.20)
RDW: 16 % — ABNORMAL HIGH (ref 11.5–14.5)
WBC: 7.8 10*3/uL (ref 3.6–11.0)

## 2018-01-26 LAB — MAGNESIUM: Magnesium: 1.5 mg/dL — ABNORMAL LOW (ref 1.7–2.4)

## 2018-01-26 LAB — TSH: TSH: 1.729 u[IU]/mL (ref 0.350–4.500)

## 2018-01-26 LAB — VITAMIN B12: Vitamin B-12: 144 pg/mL — ABNORMAL LOW (ref 180–914)

## 2018-01-27 LAB — VITAMIN D 25 HYDROXY (VIT D DEFICIENCY, FRACTURES): VIT D 25 HYDROXY: 44 ng/mL (ref 30.0–100.0)

## 2018-01-28 ENCOUNTER — Encounter: Payer: Self-pay | Admitting: Gerontology

## 2018-01-28 ENCOUNTER — Non-Acute Institutional Stay (SKILLED_NURSING_FACILITY): Payer: Medicare Other | Admitting: Gerontology

## 2018-01-28 DIAGNOSIS — S82401D Unspecified fracture of shaft of right fibula, subsequent encounter for closed fracture with routine healing: Secondary | ICD-10-CM

## 2018-01-28 DIAGNOSIS — E538 Deficiency of other specified B group vitamins: Secondary | ICD-10-CM | POA: Diagnosis not present

## 2018-01-28 DIAGNOSIS — E46 Unspecified protein-calorie malnutrition: Secondary | ICD-10-CM

## 2018-01-28 DIAGNOSIS — S82201D Unspecified fracture of shaft of right tibia, subsequent encounter for closed fracture with routine healing: Secondary | ICD-10-CM

## 2018-01-28 DIAGNOSIS — S90425A Blister (nonthermal), left lesser toe(s), initial encounter: Secondary | ICD-10-CM

## 2018-01-28 NOTE — Progress Notes (Signed)
Location:   [facility] Room Number: 301B Place of Service:  SNF [phone]) Provider:  Toni Arthurs, NP-C  System, Pcp Not In  Patient Care Team: System, Pcp Not In as PCP - General  Extended Emergency Contact Information Primary Emergency Contact: Kilman,WILLIE Address: [name]          [place], [place] [address] Home Phone: [phone] Relation: Spouse Secondary Emergency Contact: Ronie Spies Home Phone: [phone] Mobile Phone: [phone] Relation: Sister  Code Status:  FULL Goals of care: Advanced Directive information Advanced Directives 01/28/2018  Does Patient Have a Medical Advance Directive? No  Type of Advance Directive -  Would patient like information on creating a medical advance directive? No - Patient declined     Chief Complaint  Patient presents with  . Medical Management of Chronic Issues    Routine Visit    HPI:  Pt is a 84 y.o. female seen today for medical management of chronic diseases.    No problem-specific Assessment & Plan notes found for this encounter.    Past Medical History:  Diagnosis Date  . Age-related osteoporosis with current pathol fx with routine healing 01/18/2018  . Anxiety   . Arthritis   . At risk for falls   . Balance problems   . Benign hypertensive CKD 01/18/2018  . Cataract   . Chronic kidney disease   . Dementia arising in the senium and presenium 01/18/2018  . Depression   . Disease of thyroid gland   . Frail elderly   . Gout   . Hypertension   . Impaired mobility   . Memory loss   . Neuromuscular disorder ([place])   . Primary open angle glaucoma 09/11/2015  . Vision problems   . Visual impairment    Past Surgical History:  Procedure Laterality Date  . PARTIAL HYSTERECTOMY      No Known Allergies  Allergies as of 01/28/2018   No Known Allergies     Medication List        Accurate as of 01/28/18  2:10 PM. Always use your most recent med list.            acetaminophen 500 MG tablet Commonly known as:  TYLENOL Take 1,000 mg by mouth 3 (three) times daily.   allopurinol 100 MG tablet Commonly known as:  ZYLOPRIM Take 100 mg by mouth daily.   aspirin EC 81 MG tablet Take 81 mg by mouth daily.   Cholecalciferol 4000 units Caps Take 4,000 Units by mouth daily.   cyanocobalamin 1000 MCG tablet Take 2,000 mcg by mouth daily. Stop date 02/10/18   enoxaparin 40 MG/0.4ML injection Commonly known as:  LOVENOX Inject 0.4 mLs (40 mg total) into the skin daily for 20 days.   ENSURE ENLIVE PO Take 1 Bottle by mouth 2 (two) times daily between meals.   feeding supplement (PRO-STAT SUGAR FREE 64) Liqd Take 30 mLs by mouth 2 (two) times daily between meals.   Magnesium Oxide 250 MG Tabs Take 250 mg by mouth daily.   metoprolol tartrate 50 MG tablet Commonly known as:  LOPRESSOR Take 1 tablet (50 mg total) by mouth 2 (two) times daily.   mirtazapine 15 MG tablet Commonly known as:  REMERON Take 15 mg by mouth at bedtime.   oxyCODONE 5 MG immediate release tablet Commonly known as:  Oxy IR/ROXICODONE Take 2 tablets (10 mg total) by mouth every 4 (four) hours as needed for moderate pain.   polyethylene glycol packet  Commonly known as:  MIRALAX / GLYCOLAX Take 17 g by mouth daily. Mix with 4-8 ounces fluid-- for constipation   sennosides-docusate sodium 8.6-50 MG tablet Commonly known as:  SENOKOT-S Take 2 tablets by mouth 2 (two) times daily.       Review of Systems  Constitutional: Negative for activity change, appetite change, chills, diaphoresis and fever.  HENT: Negative for congestion, mouth sores, nosebleeds, postnasal drip, sneezing, sore throat, trouble swallowing and voice change.   Respiratory: Negative for apnea, cough, choking, chest tightness, shortness of breath and wheezing.   Cardiovascular: Negative for chest pain, palpitations and leg swelling.  Gastrointestinal: Negative for abdominal distention, abdominal  pain, constipation, diarrhea and nausea.  Genitourinary: Negative for difficulty urinating, dysuria, frequency and urgency.  Musculoskeletal: Positive for arthralgias (typical arthritis) and gait problem. Negative for back pain and myalgias.  Skin: Negative for color change, pallor, rash and wound.  Neurological: Negative for dizziness, tremors, syncope, speech difficulty, weakness, numbness and headaches.  Psychiatric/Behavioral: Negative for agitation and behavioral problems.  All other systems reviewed and are negative.   Immunization History  Administered Date(s) Administered  . Influenza,inj,Quad PF,6+ Mos 09/11/2015, 10/08/2016  . Influenza,inj,quad, With Preservative 11/12/2017  . Pneumococcal Conjugate-13 10/08/2016  . Pneumococcal Polysaccharide-23 11/12/2017   Pertinent  Health Maintenance Due  Topic Date Due  . PNA vac Low Risk Adult (1 of 2 - PCV13) 10/04/1993  . INFLUENZA VACCINE  Completed  . DEXA SCAN  Completed   No flowsheet data found. Functional Status Survey:    Vitals:   01/28/18 1329  BP: (!) 139/56  Pulse: 80  Resp: 20  Temp: 97.6 F (36.4 C)  TempSrc: Oral  SpO2: 95%  Weight: 131 lb 3.2 oz (59.5 kg)  Height: _0  (1.626 m)   Body mass index is 22.52 kg/m. Physical Exam  Constitutional: She is oriented to person, place, and time. Vital signs are normal. She appears well-developed and well-nourished. She is active and cooperative. She does not appear ill. No distress.  HENT:  Head: Normocephalic and atraumatic.  Mouth/Throat: Uvula is midline, oropharynx is clear and moist and mucous membranes are normal. Mucous membranes are not pale, not dry and not cyanotic.  Eyes: Conjunctivae, EOM and lids are normal. Pupils are equal, round, and reactive to light.  Neck: Trachea normal, normal range of motion and full passive range of motion without pain. Neck supple. No JVD present. No tracheal deviation, no edema and no erythema present. No thyromegaly  present.  Cardiovascular: Normal rate, regular rhythm, normal heart sounds, intact distal pulses and normal pulses. Exam reveals no gallop, no distant heart sounds and no friction rub.  No murmur heard. Pulses:      Dorsalis pedis pulses are 2+ on the right side, and 2+ on the left side.  No edema  Pulmonary/Chest: Effort normal and breath sounds normal. No accessory muscle usage. No respiratory distress. She has no decreased breath sounds. She has no wheezes. She has no rhonchi. She has no rales. She exhibits no tenderness.  Abdominal: Soft. Normal appearance and bowel sounds are normal. She exhibits no distension and no ascites. There is no tenderness.  Musculoskeletal: Normal range of motion. She exhibits no edema or tenderness.  Expected osteoarthritis, stiffness; Bilateral Calves soft, supple. Negative Homan's Sign. B- pedal pulses equal; right tib/fib fx, knee immobilizer  Neurological: She is alert and oriented to person, place, and time. She has normal strength.  Skin: Skin is warm, dry and intact. She is not diaphoretic.  No cyanosis. No pallor. Nails show no clubbing.  Left grest toe blister  Psychiatric: She has a normal mood and affect. Her speech is normal and behavior is normal. Judgment and thought content normal. Cognition and memory are normal.  Nursing note and vitals reviewed.   Labs reviewed: Recent Labs    01/15/18 0521 01/17/18 0334 01/26/18 0530  NA 142 142 144  K 4.0 4.1 3.9  CL 116* 117* 113*  CO2 20* 22 22  GLUCOSE 96 113* 75  BUN 13 12 21*  CREATININE 1.41* 1.30* 1.36*  CALCIUM 8.0* 7.8* 8.5*  MG  --   --  1.5*   Recent Labs    01/13/18 1429 01/15/18 0521 01/26/18 0530  AST 52* 59* 32  ALT 17 21 13*  ALKPHOS 95 62 138*  BILITOT 1.1 0.9 0.9  PROT 6.2* 4.8* 5.6*  ALBUMIN 3.4* 2.6* 2.4*   Recent Labs    01/13/18 1247 01/14/18 0347 01/26/18 0530  WBC 14.8* 9.5 7.8  NEUTROABS  --   --  5.2  HGB 12.8 10.8* 10.1*  HCT 39.3 32.8* 30.4*  MCV  103.2* 102.1* 103.3*  PLT 292 211 311   Lab Results  Component Value Date   TSH 1.729 01/26/2018   No results found for: HGBA1C Lab Results  Component Value Date   CHOL 109 01/26/2018   HDL 38 (L) 01/26/2018   LDLCALC 53 01/26/2018   TRIG 90 01/26/2018   CHOLHDL 2.9 01/26/2018    Significant Diagnostic Results in last 30 days:  Dg Tibia/fibula Right  Result Date: 01/13/2018 CLINICAL DATA:  Right knee pain and lower leg swelling since falling yesterday. EXAM: RIGHT TIBIA AND FIBULA - 2 VIEW COMPARISON:  None. FINDINGS: The bones are demineralized. There are acute, mildly displaced transverse fractures through the fibular head and proximal tibial metadiaphysis. No evidence of distal injury. There is generalized soft tissue edema throughout the lower leg. No evidence of foreign body. IMPRESSION: Mildly displaced extra-articular fractures of the proximal right tibia and fibula. Electronically Signed   By: Richardean Sale M.D.   On: 01/13/2018 14:07   Ct Head Wo Contrast  Result Date: 01/13/2018 CLINICAL DATA:  Patient found down after fall yesterday morning. Initial encounter. EXAM: CT HEAD WITHOUT CONTRAST TECHNIQUE: Contiguous axial images were obtained from the base of the skull through the vertex without intravenous contrast. COMPARISON:  None. FINDINGS: Brain: Mild atrophy and chronic microvascular ischemic change are seen. No evidence of acute abnormality including hemorrhage, infarct, mass lesion, mass effect, midline shift or abnormal extra-axial fluid collection. No hydrocephalus or pneumocephalus. Vascular: Atherosclerosis noted. Skull: Normal. Sinuses/Orbits: Normal. Other: None. IMPRESSION: No acute abnormality. Mild cortical atrophy. Atherosclerosis. Electronically Signed   By: Inge Rise M.D.   On: 01/13/2018 13:45   Dg Knee Complete 4 Views Right  Result Date: 01/13/2018 CLINICAL DATA:  Right knee pain and lower leg swelling since falling yesterday. EXAM: RIGHT KNEE -  COMPLETE 4+ VIEW COMPARISON:  None. FINDINGS: The bones are demineralized. There are transverse mildly displaced fractures through the fibular head and the proximal tibial metadiaphysis. Neither fracture demonstrates definite intra-articular extension. There are mild tricompartmental degenerative changes at the knee. There is only a small knee joint effusion. Femoropopliteal atherosclerosis, meniscal chondrocalcinosis and generalized soft tissue edema are noted. IMPRESSION: Mildly displaced transverse extra-articular fractures through the proximal right tibia and fibula as described. Electronically Signed   By: Richardean Sale M.D.   On: 01/13/2018 14:06    Assessment/Plan  Blister of  toe of left foot, initial encounter  Prevelon boots when in bed  Keep covers of bed off toes  Skin Prep to tips of toes and heels BID  Tibia/fibula fracture, right, closed, with routine healing, subsequent encounter  Continue working with PT/OT  Continue exercises as taught by PT/OT  Continue Knne Immobilizer at all times  Remove immobilizer Q shift for skin assessment  Continue Lovenox 40 mg SQ Q Day for DVT prophylaxis  Continue APAP 1000 mg po TID for pain  Continue Oxycodone 10 mg po Q 4 hours prn pain  NWB RLE  Protein-calorie malnutrition, unspecified severity (HCC)  Pro-stat 30 mL PO BID  Ensure Enlive 1 bottle po BID  Remeron 15 mg po Q HS  Hypomagnesemia  Magnesium oxide 250 mg po Q Day  Vitamin B12 deficiency  Cyanocobalamin 2000 mcg po Q Day x 14 days, then  Cyanocobalamin 1,000 mcg po Q Day  Family/ staff Communication:   Total Time:  Documentation:  Face to Face:  Family/Phone:   Labs/tests ordered:  Cbc, met c, mag+, tsh, Vit B12, Vit D reviewed  Medication list reviewed and assessed for continued appropriateness. Monthly medication orders reviewed and signed.  Vikki Ports, NP-C Geriatrics [facility] [phone], [place] [address] Cell Phone (Mon-Fri 8am-5pm):  [phone] On Call:  [phone] & follow prompts after 5pm & weekends Office Phone:  [phone] Office Fax:  [phone]

## 2018-01-29 ENCOUNTER — Other Ambulatory Visit
Admission: RE | Admit: 2018-01-29 | Discharge: 2018-01-29 | Disposition: A | Discharge status: Home / Self Care | Payer: Medicare Other | Source: Skilled Nursing Facility | Attending: Gerontology | Admitting: Gerontology

## 2018-01-29 DIAGNOSIS — R197 Diarrhea, unspecified: Secondary | ICD-10-CM | POA: Insufficient documentation

## 2018-01-29 LAB — C DIFFICILE QUICK SCREEN W PCR REFLEX
C Diff antigen: NEGATIVE
C Diff interpretation: NOT DETECTED
C Diff toxin: NEGATIVE

## 2018-02-09 ENCOUNTER — Non-Acute Institutional Stay (SKILLED_NURSING_FACILITY): Payer: Medicare Other | Admitting: Gerontology

## 2018-02-09 DIAGNOSIS — R600 Localized edema: Secondary | ICD-10-CM

## 2018-02-09 DIAGNOSIS — S82401A Unspecified fracture of shaft of right fibula, initial encounter for closed fracture: Secondary | ICD-10-CM | POA: Diagnosis not present

## 2018-02-09 DIAGNOSIS — E46 Unspecified protein-calorie malnutrition: Secondary | ICD-10-CM | POA: Insufficient documentation

## 2018-02-09 DIAGNOSIS — S82201A Unspecified fracture of shaft of right tibia, initial encounter for closed fracture: Secondary | ICD-10-CM

## 2018-02-09 DIAGNOSIS — E538 Deficiency of other specified B group vitamins: Secondary | ICD-10-CM | POA: Insufficient documentation

## 2018-02-09 NOTE — Progress Notes (Signed)
Location:      Place of Service:  Nursing (32) Provider:  Lorenso Quarry, NP-C  System, Pcp Not In  Patient Care Team: System, Pcp Not In as PCP - General  Extended Emergency Contact Information Primary Emergency Contact: Blair Heys Home Phone: [phone] Mobile Phone: [phone] Relation: Sister Secondary Emergency Contact: Luanne Bras Home Phone: [phone] Mobile Phone: [phone] Relation: Relative  Code Status:  DNR Goals of care: Advanced Directive information Advanced Directives 01/28/2018  Does Patient Have a Medical Advance Directive? No  Type of Advance Directive -  Would patient like information on creating a medical advance directive? No - Patient declined     Chief Complaint  Patient presents with  . Acute Visit    HPI:  Pt is a 84 y.o. female seen today for an acute visit for assessment of RLE edema. Pt was admitted to the facility for rehab after fall with tib-fib fracture and rhabdomyelosis. Fracture was non-operable- conservative treatment with knee immobilizer. Pt participated in PT and OT to the best of her abilities. She is now a LTC patient. She c/o edema that she noticed starting 2-3 days ago and increased pre-tibial tenderness. 1+ edema of the foot and upper thigh, noticed after removal of immobilizer. B-pedal pulses equal, cap refills equal, toes warm and mobile. Pt does report decreased sensation in the foot and mild tingling. Leg is in the dependent position. Pt does not elevate leg much. B-calves soft, supple. Negative Homans' sign. Pt reports typically her pain is well controlled. VSS. No other complaints.     Past Medical History:  Diagnosis Date  . Age-related osteoporosis with current pathol fx with routine healing 01/18/2018  . Anxiety   . Arthritis   . At risk for falls   . Balance problems   . Benign hypertensive CKD 01/18/2018  . Cataract   . Chronic kidney disease   . Dementia arising in the senium and presenium  01/18/2018  . Depression   . Disease of thyroid gland   . Frail elderly   . Gout   . Hypertension   . Impaired mobility   . Memory loss   . Neuromuscular disorder (HCC)   . Primary open angle glaucoma 09/11/2015  . Vision problems   . Visual impairment    Past Surgical History:  Procedure Laterality Date  . PARTIAL HYSTERECTOMY      No Known Allergies  Allergies as of 02/09/2018   No Known Allergies     Medication List        Accurate as of 02/09/18  4:54 PM. Always use your most recent med list.          acetaminophen 500 MG tablet Commonly known as:  TYLENOL Take 1,000 mg by mouth 3 (three) times daily.   allopurinol 100 MG tablet Commonly known as:  ZYLOPRIM Take 100 mg by mouth daily.   aspirin EC 81 MG tablet Take 81 mg by mouth daily.   Cholecalciferol 4000 units Caps Take 4,000 Units by mouth daily.   cyanocobalamin 1000 MCG tablet Take 2,000 mcg by mouth daily. Stop date 02/10/18   enoxaparin 40 MG/0.4ML injection Commonly known as:  LOVENOX Inject 0.4 mLs (40 mg total) into the skin daily for 20 days.   ENSURE ENLIVE PO Take 1 Bottle by mouth 2 (two) times daily between meals.   feeding supplement (PRO-STAT SUGAR FREE 64) Liqd Take 30 mLs by mouth 2 (two) times daily between meals.   Magnesium Oxide 250 MG Tabs Take  250 mg by mouth daily.   metoprolol tartrate 50 MG tablet Commonly known as:  LOPRESSOR Take 1 tablet (50 mg total) by mouth 2 (two) times daily.   mirtazapine 15 MG tablet Commonly known as:  REMERON Take 15 mg by mouth at bedtime.   oxyCODONE 5 MG immediate release tablet Commonly known as:  Oxy IR/ROXICODONE Take 2 tablets (10 mg total) by mouth every 4 (four) hours as needed for moderate pain.   polyethylene glycol packet Commonly known as:  MIRALAX / GLYCOLAX Take 17 g by mouth daily. Mix with 4-8 ounces fluid-- for constipation   sennosides-docusate sodium 8.6-50 MG tablet Commonly known as:  SENOKOT-S Take 2  tablets by mouth 2 (two) times daily.       Review of Systems  Constitutional: Negative for activity change, appetite change, chills, diaphoresis and fever.  HENT: Negative for congestion, mouth sores, nosebleeds, postnasal drip, sneezing, sore throat, trouble swallowing and voice change.   Respiratory: Negative for apnea, cough, choking, chest tightness, shortness of breath and wheezing.   Cardiovascular: Positive for leg swelling. Negative for chest pain and palpitations.  Gastrointestinal: Negative for abdominal distention, abdominal pain, constipation, diarrhea and nausea.  Genitourinary: Negative for difficulty urinating, dysuria, frequency and urgency.  Musculoskeletal: Positive for arthralgias (typical arthritis) and gait problem. Negative for back pain and myalgias.  Skin: Negative for color change, pallor, rash and wound.  Neurological: Negative for dizziness, tremors, syncope, speech difficulty, weakness, numbness and headaches.  Psychiatric/Behavioral: Negative for agitation and behavioral problems.  All other systems reviewed and are negative.   Immunization History  Administered Date(s) Administered  . Influenza,inj,Quad PF,6+ Mos 09/11/2015, 10/08/2016  . Influenza,inj,quad, With Preservative 11/12/2017  . Pneumococcal Conjugate-13 10/08/2016  . Pneumococcal Polysaccharide-23 11/12/2017   Pertinent  Health Maintenance Due  Topic Date Due  . INFLUENZA VACCINE  Completed  . DEXA SCAN  Completed  . PNA vac Low Risk Adult  Completed   No flowsheet data found. Functional Status Survey:    Vitals:   02/08/18 1100  BP: 136/87  Pulse: 95  Resp: 13  Temp: 98.5 F (36.9 C)  SpO2: 99%  Weight: 125 lb 8 oz (56.9 kg)   Body mass index is 21.54 kg/m. Physical Exam  Constitutional: She is oriented to person, place, and time. Vital signs are normal. She appears well-developed and well-nourished. She is active and cooperative. She does not appear ill. No distress.    HENT:  Head: Normocephalic and atraumatic.  Mouth/Throat: Uvula is midline, oropharynx is clear and moist and mucous membranes are normal. Mucous membranes are not pale, not dry and not cyanotic.  Eyes: Conjunctivae, EOM and lids are normal. Pupils are equal, round, and reactive to light.  Neck: Trachea normal, normal range of motion and full passive range of motion without pain. Neck supple. No JVD present. No tracheal deviation, no edema and no erythema present. No thyromegaly present.  Cardiovascular: Normal rate, regular rhythm, normal heart sounds and intact distal pulses. Exam reveals no gallop, no distant heart sounds and no friction rub.  No murmur heard. Pulses:      Dorsalis pedis pulses are 1+ on the right side, and 1+ on the left side.  1+ RLE edema  Pulmonary/Chest: Effort normal and breath sounds normal. No accessory muscle usage. No respiratory distress. She has no decreased breath sounds. She has no wheezes. She has no rhonchi. She has no rales. She exhibits no tenderness.  Abdominal: Soft. Normal appearance and bowel sounds are normal.  She exhibits no distension and no ascites. There is no tenderness.  Musculoskeletal: Normal range of motion.       Right lower leg: She exhibits tenderness and edema.  Expected osteoarthritis, stiffness; Bilateral Calves soft, supple. Negative Homan's Sign. B- pedal pulses equal; RLE tib-fib fx, in knee immobilizer  Neurological: She is alert and oriented to person, place, and time. She has normal strength. A sensory deficit is present. She exhibits abnormal muscle tone. Coordination and gait abnormal.  Mild tingling, decreased sensation of RLE  Skin: Skin is warm, dry and intact. She is not diaphoretic. No cyanosis. No pallor. Nails show no clubbing.  Psychiatric: She has a normal mood and affect. Her speech is normal and behavior is normal. Judgment and thought content normal. Cognition and memory are normal.  Nursing note and vitals  reviewed.   Labs reviewed: Recent Labs    01/15/18 0521 01/17/18 0334 01/26/18 0530  NA 142 142 144  K 4.0 4.1 3.9  CL 116* 117* 113*  CO2 20* 22 22  GLUCOSE 96 113* 75  BUN 13 12 21*  CREATININE 1.41* 1.30* 1.36*  CALCIUM 8.0* 7.8* 8.5*  MG  --   --  1.5*   Recent Labs    01/13/18 1429 01/15/18 0521 01/26/18 0530  AST 52* 59* 32  ALT 17 21 13*  ALKPHOS 95 62 138*  BILITOT 1.1 0.9 0.9  PROT 6.2* 4.8* 5.6*  ALBUMIN 3.4* 2.6* 2.4*   Recent Labs    01/13/18 1247 01/14/18 0347 01/26/18 0530  WBC 14.8* 9.5 7.8  NEUTROABS  --   --  5.2  HGB 12.8 10.8* 10.1*  HCT 39.3 32.8* 30.4*  MCV 103.2* 102.1* 103.3*  PLT 292 211 311   Lab Results  Component Value Date   TSH 1.729 01/26/2018   No results found for: HGBA1C Lab Results  Component Value Date   CHOL 109 01/26/2018   HDL 38 (L) 01/26/2018   LDLCALC 53 01/26/2018   TRIG 90 01/26/2018   CHOLHDL 2.9 01/26/2018    Significant Diagnostic Results in last 30 days:  Dg Tibia/fibula Right  Result Date: 01/13/2018 CLINICAL DATA:  Right knee pain and lower leg swelling since falling yesterday. EXAM: RIGHT TIBIA AND FIBULA - 2 VIEW COMPARISON:  None. FINDINGS: The bones are demineralized. There are acute, mildly displaced transverse fractures through the fibular head and proximal tibial metadiaphysis. No evidence of distal injury. There is generalized soft tissue edema throughout the lower leg. No evidence of foreign body. IMPRESSION: Mildly displaced extra-articular fractures of the proximal right tibia and fibula. Electronically Signed   By: Carey Bullocks M.D.   On: 01/13/2018 14:07   Ct Head Wo Contrast  Result Date: 01/13/2018 CLINICAL DATA:  Patient found down after fall yesterday morning. Initial encounter. EXAM: CT HEAD WITHOUT CONTRAST TECHNIQUE: Contiguous axial images were obtained from the base of the skull through the vertex without intravenous contrast. COMPARISON:  None. FINDINGS: Brain: Mild atrophy and  chronic microvascular ischemic change are seen. No evidence of acute abnormality including hemorrhage, infarct, mass lesion, mass effect, midline shift or abnormal extra-axial fluid collection. No hydrocephalus or pneumocephalus. Vascular: Atherosclerosis noted. Skull: Normal. Sinuses/Orbits: Normal. Other: None. IMPRESSION: No acute abnormality. Mild cortical atrophy. Atherosclerosis. Electronically Signed   By: Drusilla Kanner M.D.   On: 01/13/2018 13:45   Dg Knee Complete 4 Views Right  Result Date: 01/13/2018 CLINICAL DATA:  Right knee pain and lower leg swelling since falling yesterday. EXAM: RIGHT KNEE -  COMPLETE 4+ VIEW COMPARISON:  None. FINDINGS: The bones are demineralized. There are transverse mildly displaced fractures through the fibular head and the proximal tibial metadiaphysis. Neither fracture demonstrates definite intra-articular extension. There are mild tricompartmental degenerative changes at the knee. There is only a small knee joint effusion. Femoropopliteal atherosclerosis, meniscal chondrocalcinosis and generalized soft tissue edema are noted. IMPRESSION: Mildly displaced transverse extra-articular fractures through the proximal right tibia and fibula as described. Electronically Signed   By: Carey Bullocks M.D.   On: 01/13/2018 14:06    Assessment/Plan Kathrynn was seen today for acute visit.  Diagnoses and all orders for this visit:  Localized edema  Tibia/fibula fracture, right, closed, initial encounter    Loosen knee immobilizer  Remove knee immobilizer q shift for skin assessment  Thigh-High TEDS to BLE- on in the early am, off in the pm  Elevated legs on pillow when at rest  Float heels  Prevelon boots to BLE  Family/ staff Communication:   Total Time:  Documentation:  Face to Face:  Family/Phone:   Labs/tests ordered:    Medication list reviewed and assessed for continued appropriateness.  Brynda Rim, NP-C Geriatrics [facility] [phone] [address], [place] [address] Cell Phone (Mon-Fri 8am-5pm):  [phone] On Call:  [phone] & follow prompts after 5pm & weekends Office Phone:  [phone] Office Fax:  [phone]

## 2018-02-22 ENCOUNTER — Encounter
Admission: RE | Admit: 2018-02-22 | Discharge: 2018-02-22 | Disposition: A | Discharge status: Home / Self Care | Payer: Medicare Other | Source: Ambulatory Visit | Attending: Internal Medicine | Admitting: Internal Medicine

## 2018-03-11 ENCOUNTER — Encounter: Payer: Self-pay | Admitting: Gerontology

## 2018-03-11 ENCOUNTER — Non-Acute Institutional Stay (SKILLED_NURSING_FACILITY): Payer: Medicare Other | Admitting: Gerontology

## 2018-03-11 DIAGNOSIS — I872 Venous insufficiency (chronic) (peripheral): Secondary | ICD-10-CM | POA: Diagnosis not present

## 2018-03-11 DIAGNOSIS — I10 Essential (primary) hypertension: Secondary | ICD-10-CM | POA: Diagnosis not present

## 2018-03-11 DIAGNOSIS — F0390 Unspecified dementia without behavioral disturbance: Secondary | ICD-10-CM

## 2018-03-11 NOTE — Progress Notes (Signed)
Location:    Nursing Home Room Number: 301A Place of Service:  SNF ([id number]) Provider:  Lorenso Quarry, NP-C  System, Pcp Not In  Patient Care Team: System, Pcp Not In as PCP - General  Extended Emergency Contact Information Primary Emergency Contact: Blair Heys Home Phone: [phone] Mobile Phone: [phone] Relation: Sister Secondary Emergency Contact: Luanne Bras Home Phone: [phone] Mobile Phone: [phone] Relation: Relative  Code Status:  DNR Goals of care: Advanced Directive information Advanced Directives 03/11/2018  Does Patient Have a Medical Advance Directive? Yes  Type of Advance Directive Out of facility DNR (pink MOST or yellow form)  Does patient want to make changes to medical advance directive? No - Patient declined  Would patient like information on creating a medical advance directive? -     Chief Complaint  Patient presents with  . Medical Management of Chronic Issues    Routine Visit    HPI:  Pt is a 84 y.o. female seen today for medical management of chronic diseases.    Venous insufficiency of both lower extremities Stable. Pt continues with intermittent BLE edema. Elevates legs when at rest, TED Hose. No c/o leg pain. No active wounds.   Essential hypertension Elevated. Pt is currently on Metoprolol 50 mg po BID. Will add Cozaar for better control. No c/o chest pain or shortness of breath.   Dementia arising in the senium and presenium Stable. Confused, pleasant. Pt is now a LTC resident. Not on medications for memory- not beneficial at this point.   Please note pt with limited verbal/cognitive ability. Unable to obtain complete ROS. Some ROS info obtained from staff and documentation.   Past Medical History:  Diagnosis Date  . Age-related osteoporosis with current pathol fx with routine healing 01/18/2018  . Anxiety   . Arthritis   . At risk for falls   . Balance problems   . Benign hypertensive CKD 01/18/2018  .  Cataract   . Chronic kidney disease   . Dementia arising in the senium and presenium 01/18/2018  . Depression   . Disease of thyroid gland   . Frail elderly   . Gout   . Hypertension   . Impaired mobility   . Memory loss   . Neuromuscular disorder (HCC)   . Primary open angle glaucoma 09/11/2015  . Vision problems   . Visual impairment    Past Surgical History:  Procedure Laterality Date  . PARTIAL HYSTERECTOMY      No Known Allergies  Allergies as of 03/11/2018   No Known Allergies     Medication List        Accurate as of 03/11/18 11:51 PM. Always use your most recent med list.          acetaminophen 500 MG tablet Commonly known as:  TYLENOL Take 1,000 mg by mouth 3 (three) times daily.   allopurinol 100 MG tablet Commonly known as:  ZYLOPRIM Take 100 mg by mouth daily.   aspirin EC 81 MG tablet Take 81 mg by mouth daily.   Cholecalciferol 4000 units Caps Take 4,000 Units by mouth daily.   cyanocobalamin 1000 MCG tablet Take 1,000 mcg by mouth daily.   ENSURE ENLIVE PO Take 1 Bottle by mouth 2 (two) times daily between meals.   feeding supplement (PRO-STAT SUGAR FREE 64) Liqd Take 30 mLs by mouth 2 (two) times daily between meals.   latanoprost 0.005 % ophthalmic solution Commonly known as:  XALATAN Place 1 drop into both eyes at bedtime.  Magnesium Oxide 250 MG Tabs Take 250 mg by mouth daily.   metoprolol tartrate 50 MG tablet Commonly known as:  LOPRESSOR Take 1 tablet (50 mg total) by mouth 2 (two) times daily.   mirtazapine 15 MG tablet Commonly known as:  REMERON Take 15 mg by mouth at bedtime.   oxyCODONE 5 MG immediate release tablet Commonly known as:  Oxy IR/ROXICODONE Take 2 tablets (10 mg total) by mouth every 4 (four) hours as needed for moderate pain.   polyethylene glycol packet Commonly known as:  MIRALAX / GLYCOLAX Take 17 g by mouth daily as needed. Mix with 4-8 ounces fluid-- for constipation   sennosides-docusate  sodium 8.6-50 MG tablet Commonly known as:  SENOKOT-S Take 2 tablets by mouth 2 (two) times daily.       Review of Systems  Unable to perform ROS: Dementia  Constitutional: Negative for activity change, appetite change, chills, diaphoresis and fever.  HENT: Negative for congestion, mouth sores, nosebleeds, postnasal drip, sneezing, sore throat, trouble swallowing and voice change.   Respiratory: Negative for apnea, cough, choking, chest tightness, shortness of breath and wheezing.   Cardiovascular: Negative for chest pain, palpitations and leg swelling.  Gastrointestinal: Negative for abdominal distention, abdominal pain, constipation, diarrhea and nausea.  Genitourinary: Negative for difficulty urinating, dysuria, frequency and urgency.  Musculoskeletal: Positive for arthralgias (typical arthritis) and gait problem. Negative for back pain and myalgias.  Skin: Negative for color change, pallor, rash and wound.  Neurological: Positive for weakness. Negative for dizziness, tremors, syncope, speech difficulty, numbness and headaches.  Psychiatric/Behavioral: Negative for agitation and behavioral problems.  All other systems reviewed and are negative.   Immunization History  Administered Date(s) Administered  . Influenza,inj,Quad PF,6+ Mos 09/11/2015, 10/08/2016  . Influenza,inj,quad, With Preservative 11/12/2017  . Pneumococcal Conjugate-13 10/08/2016  . Pneumococcal Polysaccharide-23 11/12/2017   Pertinent  Health Maintenance Due  Topic Date Due  . INFLUENZA VACCINE  06/24/2018  . DEXA SCAN  Completed  . PNA vac Low Risk Adult  Completed   No flowsheet data found. Functional Status Survey:    Vitals:   03/11/18 0927  BP: (!) 155/59  Pulse: 88  Resp: 18  Temp: 97.8 F (36.6 C)  TempSrc: Oral  SpO2: 100%  Weight: 120 lb 11.2 oz (54.7 kg)  Height: 5\' 4"  (1.626 m)   Body mass index is 20.72 kg/m. Physical Exam  Constitutional: Vital signs are normal. She appears  well-developed and well-nourished. She is active and cooperative. She does not appear ill. No distress.  HENT:  Head: Normocephalic and atraumatic.  Mouth/Throat: Uvula is midline, oropharynx is clear and moist and mucous membranes are normal. Mucous membranes are not pale, not dry and not cyanotic.  Eyes: Pupils are equal, round, and reactive to light. Conjunctivae, EOM and lids are normal.  Neck: Trachea normal, normal range of motion and full passive range of motion without pain. Neck supple. No JVD present. No tracheal deviation, no edema and no erythema present. No thyromegaly present.  Cardiovascular: Normal rate, regular rhythm, normal heart sounds, intact distal pulses and normal pulses. Exam reveals no gallop, no distant heart sounds and no friction rub.  No murmur heard. Pulses:      Dorsalis pedis pulses are 2+ on the right side, and 2+ on the left side.  No edema  Pulmonary/Chest: Effort normal and breath sounds normal. No accessory muscle usage. No respiratory distress. She has no decreased breath sounds. She has no wheezes. She has no rhonchi. She has  no rales. She exhibits no tenderness.  Abdominal: Soft. Normal appearance and bowel sounds are normal. She exhibits no distension and no ascites. There is no tenderness.  Musculoskeletal: Normal range of motion. She exhibits no edema or tenderness.  Expected osteoarthritis, stiffness; Bilateral Calves soft, supple. Negative Homan's Sign. B- pedal pulses equal; generalized weakness, mobile in wheelchair  Neurological: She is alert. She has normal strength. Coordination and gait abnormal.  Skin: Skin is warm, dry and intact. She is not diaphoretic. No cyanosis. No pallor. Nails show no clubbing.  Psychiatric: She has a normal mood and affect. Judgment and thought content normal. Her speech is delayed. She is slowed. Cognition and memory are impaired. She exhibits abnormal recent memory.  Nursing note and vitals reviewed.   Labs  reviewed: Recent Labs    01/15/18 0521 01/17/18 0334 01/26/18 0530  NA 142 142 144  K 4.0 4.1 3.9  CL 116* 117* 113*  CO2 20* 22 22  GLUCOSE 96 113* 75  BUN 13 12 21*  CREATININE 1.41* 1.30* 1.36*  CALCIUM 8.0* 7.8* 8.5*  MG  --   --  1.5*   Recent Labs    01/13/18 1429 01/15/18 0521 01/26/18 0530  AST 52* 59* 32  ALT 17 21 13*  ALKPHOS 95 62 138*  BILITOT 1.1 0.9 0.9  PROT 6.2* 4.8* 5.6*  ALBUMIN 3.4* 2.6* 2.4*   Recent Labs    01/13/18 1247 01/14/18 0347 01/26/18 0530  WBC 14.8* 9.5 7.8  NEUTROABS  --   --  5.2  HGB 12.8 10.8* 10.1*  HCT 39.3 32.8* 30.4*  MCV 103.2* 102.1* 103.3*  PLT 292 211 311   Lab Results  Component Value Date   TSH 1.729 01/26/2018   No results found for: HGBA1C Lab Results  Component Value Date   CHOL 109 01/26/2018   HDL 38 (L) 01/26/2018   LDLCALC 53 01/26/2018   TRIG 90 01/26/2018   CHOLHDL 2.9 01/26/2018    Significant Diagnostic Results in last 30 days:  No results found.  Assessment/Plan Neema was seen today for medical management of chronic issues.  Diagnoses and all orders for this visit:  Venous insufficiency of both lower extremities  Essential hypertension  Dementia arising in the senium and presenium   Above listed conditions stable, except Hypertension  Continue current medication regimen  Add Losartan 25 mg po Q Day for now  Monitor B/P and pulse frequently  Monitor for worsening memory  Encourage pt to elevate legs at rest  TED Hose daily  Encourage po fluid intake for hydration  Safety precautions  Fall precautions  Continue to be followed by Palliative Care  Family/ staff Communication:   Total Time:  Documentation:  Face to Face:  Family/Phone:   Labs/tests ordered:  Not due, recent labs reviewed  Medication list reviewed and assessed for continued appropriateness. Monthly medication orders reviewed and signed.  Brynda Rim, NP-C Geriatrics [facility] [phone] [address], [place] [address] Cell Phone (Mon-Fri 8am-5pm):  [phone] On Call:  [phone] & follow prompts after 5pm & weekends Office Phone:  [phone] Office Fax:  [phone]

## 2018-03-11 NOTE — Assessment & Plan Note (Signed)
Elevated. Pt is currently on Metoprolol 50 mg po BID. Will add Cozaar for better control. No c/o chest pain or shortness of breath.

## 2018-03-11 NOTE — Assessment & Plan Note (Signed)
Stable. Pt continues with intermittent BLE edema. Elevates legs when at rest, TED Hose. No c/o leg pain. No active wounds.

## 2018-03-11 NOTE — Assessment & Plan Note (Signed)
Stable. Confused, pleasant. Pt is now a LTC resident. Not on medications for memory- not beneficial at this point.

## 2018-03-19 ENCOUNTER — Other Ambulatory Visit: Payer: Self-pay

## 2018-03-19 MED ORDER — OXYCODONE HCL 5 MG PO TABS: 10.0000 mg | ORAL_TABLET | ORAL | 0 refills | Status: DC | PRN

## 2018-03-19 NOTE — Telephone Encounter (Signed)
Rx sent to [facility] phone : [phone] , fax : [phone]  

## 2018-03-24 ENCOUNTER — Encounter
Admission: RE | Admit: 2018-03-24 | Discharge: 2018-03-24 | Disposition: A | Discharge status: Home / Self Care | Payer: Medicare Other | Source: Ambulatory Visit | Attending: Internal Medicine | Admitting: Internal Medicine

## 2018-04-13 ENCOUNTER — Non-Acute Institutional Stay (SKILLED_NURSING_FACILITY): Payer: Medicare Other | Admitting: Gerontology

## 2018-04-13 ENCOUNTER — Encounter: Payer: Self-pay | Admitting: Gerontology

## 2018-04-13 DIAGNOSIS — L8990 Pressure ulcer of unspecified site, unspecified stage: Secondary | ICD-10-CM

## 2018-04-13 DIAGNOSIS — S82401D Unspecified fracture of shaft of right fibula, subsequent encounter for closed fracture with routine healing: Secondary | ICD-10-CM | POA: Diagnosis not present

## 2018-04-13 DIAGNOSIS — S82201D Unspecified fracture of shaft of right tibia, subsequent encounter for closed fracture with routine healing: Secondary | ICD-10-CM | POA: Diagnosis not present

## 2018-04-13 DIAGNOSIS — T85898A Other specified complication of other internal prosthetic devices, implants and grafts, initial encounter: Secondary | ICD-10-CM

## 2018-04-13 DIAGNOSIS — M8000XD Age-related osteoporosis with current pathological fracture, unspecified site, subsequent encounter for fracture with routine healing: Secondary | ICD-10-CM

## 2018-04-13 NOTE — Progress Notes (Signed)
Location:   [facility] Room Number: 301B Place of Service:  SNF [phone]) Provider:  Lorenso Quarry, NP-C  System, Pcp Not In  Patient Care Team: System, Pcp Not In as PCP - General  Extended Emergency Contact Information Primary Emergency Contact: Blair Heys Home Phone: [phone] Mobile Phone: [phone] Relation: Sister Secondary Emergency Contact: Luanne Bras Home Phone: [phone] Mobile Phone: [phone] Relation: Relative  Code Status:  DNR Goals of care: Advanced Directive information Advanced Directives 04/13/2018  Does Patient Have a Medical Advance Directive? Yes  Type of Advance Directive Out of facility DNR (pink MOST or yellow form)  Does patient want to make changes to medical advance directive? No - Patient declined  Would patient like information on creating a medical advance directive? -     Chief Complaint  Patient presents with  . Medical Management of Chronic Issues    Routine Visit    HPI:  Pt is a 84 y.o. female seen today for medical management of chronic diseases.    Tibia/fibula fracture, right, closed, with routine healing, subsequent encounter Stable. Right Tib/fib fracture without surgical intervention. Treating conservatively with knee immobilizer and NWB. Pt reports pain is well controlled on current regimen. Only c/o intermittent muscle spasms. No longer receiving PT/OT  Age-related osteoporosis with current pathological fracture with routine healing Stable. Pt is on Cholecalciferol 4,000 units po Q Day and nutritional supplements. No recent falls. Most recent Vitamin D level 44.0   Pressure ulcer caused by device New finding. Area on right posterior calf. Adjacent to what appears to be an old surgical incision. Stage 2. Metal stays in the knee immobilizer appears to have been the source. Area ~2 x 2 cm, shallow depth. Minimal drainage. Wound bed pink, moist. Periwound tissue mildly reddened. Tender  to touch. WOC RN notified.    Past Medical History:  Diagnosis Date  . Age-related osteoporosis with current pathol fx with routine healing 01/18/2018  . Anxiety   . Arthritis   . At risk for falls   . Balance problems   . Benign hypertensive CKD 01/18/2018  . Cataract   . Chronic kidney disease   . Dementia arising in the senium and presenium 01/18/2018  . Depression   . Disease of thyroid gland   . Frail elderly   . Gout   . Hypertension   . Impaired mobility   . Memory loss   . Neuromuscular disorder (HCC)   . Primary open angle glaucoma 09/11/2015  . Vision problems   . Visual impairment    Past Surgical History:  Procedure Laterality Date  . PARTIAL HYSTERECTOMY      No Known Allergies  Allergies as of 04/13/2018   No Known Allergies     Medication List        Accurate as of 04/13/18  9:03 AM. Always use your most recent med list.          acetaminophen 500 MG tablet Commonly known as:  TYLENOL Take 1,000 mg by mouth 3 (three) times daily.   allopurinol 100 MG tablet Commonly known as:  ZYLOPRIM Take 100 mg by mouth daily.   aspirin EC 81 MG tablet Take 81 mg by mouth daily.   Cholecalciferol 4000 units Caps Take 4,000 Units by mouth daily.   cyanocobalamin 1000 MCG tablet Take 1,000 mcg by mouth daily.   ENSURE ENLIVE PO Take 1 Bottle by mouth 2 (two) times daily between meals.   feeding supplement (PRO-STAT SUGAR FREE  64) Liqd Take 30 mLs by mouth 2 (two) times daily between meals.   latanoprost 0.005 % ophthalmic solution Commonly known as:  XALATAN Place 1 drop into both eyes at bedtime.   losartan 25 MG tablet Commonly known as:  COZAAR Take 25 mg by mouth daily.   metoprolol tartrate 50 MG tablet Commonly known as:  LOPRESSOR Take 1 tablet (50 mg total) by mouth 2 (two) times daily.   mirtazapine 15 MG tablet Commonly known as:  REMERON Take 15 mg by mouth at bedtime.   oxyCODONE 5 MG immediate release tablet Commonly known  as:  Oxy IR/ROXICODONE Take 2 tablets (10 mg total) by mouth every 4 (four) hours as needed for moderate pain.   polyethylene glycol packet Commonly known as:  MIRALAX / GLYCOLAX Take 17 g by mouth daily as needed. Mix with 4-8 ounces fluid-- for constipation       Review of Systems  Constitutional: Negative for activity change, appetite change, chills, diaphoresis and fever.  HENT: Negative for congestion, mouth sores, nosebleeds, postnasal drip, sneezing, sore throat, trouble swallowing and voice change.   Respiratory: Negative for apnea, cough, choking, chest tightness, shortness of breath and wheezing.   Cardiovascular: Negative for chest pain, palpitations and leg swelling.  Gastrointestinal: Negative for abdominal distention, abdominal pain, constipation, diarrhea and nausea.  Genitourinary: Negative for difficulty urinating, dysuria, frequency and urgency.  Musculoskeletal: Positive for arthralgias (typical arthritis) and gait problem. Negative for back pain and myalgias.  Skin: Positive for wound. Negative for color change, pallor and rash.  Neurological: Positive for weakness. Negative for dizziness, tremors, syncope, speech difficulty, numbness and headaches.  Psychiatric/Behavioral: Negative for agitation and behavioral problems.  All other systems reviewed and are negative.   Immunization History  Administered Date(s) Administered  . Influenza,inj,Quad PF,6+ Mos 09/11/2015, 10/08/2016  . Influenza,inj,quad, With Preservative 11/12/2017  . Pneumococcal Conjugate-13 10/08/2016  . Pneumococcal Polysaccharide-23 11/12/2017   Pertinent  Health Maintenance Due  Topic Date Due  . INFLUENZA VACCINE  06/24/2018  . DEXA SCAN  Completed  . PNA vac Low Risk Adult  Completed   No flowsheet data found. Functional Status Survey:    Vitals:   04/13/18 0845  BP: (!) 122/46  Pulse: 86  Resp: 14  Temp: 98.4 F (36.9 C)  TempSrc: Oral  SpO2: 99%  Weight: 120 lb 8 oz (54.7  kg)  Height: 5\' 4"  (1.626 m)   Body mass index is 20.68 kg/m. Physical Exam  Constitutional: She is oriented to person, place, and time. Vital signs are normal. She appears well-developed and well-nourished. She is active and cooperative. She does not appear ill. No distress.  HENT:  Head: Normocephalic and atraumatic.  Mouth/Throat: Uvula is midline, oropharynx is clear and moist and mucous membranes are normal. Mucous membranes are not pale, not dry and not cyanotic.  Eyes: Pupils are equal, round, and reactive to light. Conjunctivae, EOM and lids are normal.  Neck: Trachea normal, normal range of motion and full passive range of motion without pain. Neck supple. No JVD present. No tracheal deviation, no edema and no erythema present. No thyromegaly present.  Cardiovascular: Normal rate, regular rhythm, normal heart sounds, intact distal pulses and normal pulses. Exam reveals no gallop, no distant heart sounds and no friction rub.  No murmur heard. Pulses:      Dorsalis pedis pulses are 2+ on the right side, and 2+ on the left side.  No edema  Pulmonary/Chest: Effort normal and breath sounds normal. No  accessory muscle usage. No respiratory distress. She has no decreased breath sounds. She has no wheezes. She has no rhonchi. She has no rales. She exhibits no tenderness.  Abdominal: Soft. Normal appearance and bowel sounds are normal. She exhibits no distension and no ascites. There is no tenderness.  Musculoskeletal: She exhibits no edema.       Right ankle: She exhibits decreased range of motion and swelling. Tenderness.  Expected osteoarthritis, stiffness; Bilateral Calves soft, supple. Negative Homan's Sign. B- pedal pulses equal  Neurological: She is alert and oriented to person, place, and time. She has normal strength.  Skin: Skin is warm and dry. Lesion (stage 2 pressure wound) noted. She is not diaphoretic. No cyanosis. No pallor. Nails show no clubbing.     Psychiatric: She has a  normal mood and affect. Her speech is normal and behavior is normal. Judgment and thought content normal. Cognition and memory are normal.  Nursing note and vitals reviewed.   Labs reviewed: Recent Labs    01/15/18 0521 01/17/18 0334 01/26/18 0530  NA 142 142 144  K 4.0 4.1 3.9  CL 116* 117* 113*  CO2 20* 22 22  GLUCOSE 96 113* 75  BUN 13 12 21*  CREATININE 1.41* 1.30* 1.36*  CALCIUM 8.0* 7.8* 8.5*  MG  --   --  1.5*   Recent Labs    01/13/18 1429 01/15/18 0521 01/26/18 0530  AST 52* 59* 32  ALT 17 21 13*  ALKPHOS 95 62 138*  BILITOT 1.1 0.9 0.9  PROT 6.2* 4.8* 5.6*  ALBUMIN 3.4* 2.6* 2.4*   Recent Labs    01/13/18 1247 01/14/18 0347 01/26/18 0530  WBC 14.8* 9.5 7.8  NEUTROABS  --   --  5.2  HGB 12.8 10.8* 10.1*  HCT 39.3 32.8* 30.4*  MCV 103.2* 102.1* 103.3*  PLT 292 211 311   Lab Results  Component Value Date   TSH 1.729 01/26/2018   No results found for: HGBA1C Lab Results  Component Value Date   CHOL 109 01/26/2018   HDL 38 (L) 01/26/2018   LDLCALC 53 01/26/2018   TRIG 90 01/26/2018   CHOLHDL 2.9 01/26/2018    Significant Diagnostic Results in last 30 days:  No results found.  Assessment/Plan  Tibia/fibula fracture, right, closed, with routine healing, subsequent encounter  Age-related osteoporosis with current pathological fracture with routine healing  Pressure ulcer caused by device   Above listed conditions stable, except new ulcer  Continue current medication regimen, except  Add Methocarbamol 500 mg po Q 8 hours prn, spasms, cramps  Bacitracin ointment to the leg wound daily until healed  Apply Bacitracin ointment to 2x2s, place on wound, cover with Allevyn daily. Change Allevyn Q 3 days  Remove knee immobilizer Q shift for skin assessment  Pad the area of the wound with a folded washcloth for protection from pressure  Monitor for worsening pain  Continue nutritional supplements  Monitor wound for proper  healing  DuoDerm over the right knee for skin protection for pressure from knee immobilizer  Continue to be followed by Palliative Medicine  Family/ staff Communication:   Total Time:  Documentation:  Face to Face:  Family/Phone:  Labs/tests ordered:  Not due   Medication list reviewed and assessed for continued appropriateness. Monthly medication orders reviewed and signed.  Brynda Rim, NP-C Geriatrics [facility] [phone], [place] [address] Cell Phone (Mon-Fri 8am-5pm):  [phone] On Call:  [phone] & follow  prompts after 5pm & weekends Office Phone:  [phone] Office Fax:  [phone]

## 2018-04-15 DIAGNOSIS — T85898A Other specified complication of other internal prosthetic devices, implants and grafts, initial encounter: Secondary | ICD-10-CM

## 2018-04-15 DIAGNOSIS — L8990 Pressure ulcer of unspecified site, unspecified stage: Secondary | ICD-10-CM | POA: Insufficient documentation

## 2018-04-15 NOTE — Assessment & Plan Note (Signed)
Stable. Pt is on Cholecalciferol 4,000 units po Q Day and nutritional supplements. No recent falls. Most recent Vitamin D level 44.0

## 2018-04-15 NOTE — Assessment & Plan Note (Signed)
New finding. Area on right posterior calf. Adjacent to what appears to be an old surgical incision. Stage 2. Metal stays in the knee immobilizer appears to have been the source. Area ~2 x 2 cm, shallow depth. Minimal drainage. Wound bed pink, moist. Periwound tissue mildly reddened. Tender to touch. WOC RN notified.

## 2018-04-15 NOTE — Assessment & Plan Note (Signed)
Stable. Right Tib/fib fracture without surgical intervention. Treating conservatively with knee immobilizer and NWB. Pt reports pain is well controlled on current regimen. Only c/o intermittent muscle spasms. No longer receiving PT/OT

## 2018-04-23 ENCOUNTER — Other Ambulatory Visit
Admission: RE | Admit: 2018-04-23 | Discharge: 2018-04-23 | Disposition: A | Discharge status: Home / Self Care | Payer: Medicare Other | Source: Skilled Nursing Facility | Attending: Internal Medicine | Admitting: Internal Medicine

## 2018-04-23 DIAGNOSIS — X58XXXA Exposure to other specified factors, initial encounter: Secondary | ICD-10-CM | POA: Diagnosis not present

## 2018-04-23 DIAGNOSIS — S81801A Unspecified open wound, right lower leg, initial encounter: Secondary | ICD-10-CM | POA: Diagnosis present

## 2018-04-24 ENCOUNTER — Encounter
Admission: RE | Admit: 2018-04-24 | Discharge: 2018-04-24 | Disposition: A | Discharge status: Home / Self Care | Payer: Medicare Other | Source: Ambulatory Visit | Attending: Internal Medicine | Admitting: Internal Medicine

## 2018-04-26 LAB — AEROBIC CULTURE W GRAM STAIN (SUPERFICIAL SPECIMEN)

## 2018-04-26 LAB — AEROBIC CULTURE  (SUPERFICIAL SPECIMEN): GRAM STAIN: NONE SEEN

## 2018-04-30 ENCOUNTER — Encounter: Payer: Medicare Other | Attending: Nurse Practitioner | Admitting: Nurse Practitioner

## 2018-04-30 DIAGNOSIS — Z87891 Personal history of nicotine dependence: Secondary | ICD-10-CM | POA: Insufficient documentation

## 2018-04-30 DIAGNOSIS — L89623 Pressure ulcer of left heel, stage 3: Secondary | ICD-10-CM | POA: Diagnosis present

## 2018-04-30 DIAGNOSIS — L89890 Pressure ulcer of other site, unstageable: Secondary | ICD-10-CM | POA: Diagnosis not present

## 2018-04-30 DIAGNOSIS — Z66 Do not resuscitate: Secondary | ICD-10-CM | POA: Diagnosis not present

## 2018-04-30 DIAGNOSIS — I872 Venous insufficiency (chronic) (peripheral): Secondary | ICD-10-CM | POA: Insufficient documentation

## 2018-04-30 DIAGNOSIS — L089 Local infection of the skin and subcutaneous tissue, unspecified: Secondary | ICD-10-CM | POA: Diagnosis not present

## 2018-04-30 DIAGNOSIS — F0390 Unspecified dementia without behavioral disturbance: Secondary | ICD-10-CM | POA: Diagnosis not present

## 2018-04-30 DIAGNOSIS — M069 Rheumatoid arthritis, unspecified: Secondary | ICD-10-CM | POA: Insufficient documentation

## 2018-04-30 DIAGNOSIS — I129 Hypertensive chronic kidney disease with stage 1 through stage 4 chronic kidney disease, or unspecified chronic kidney disease: Secondary | ICD-10-CM | POA: Diagnosis not present

## 2018-05-03 NOTE — Progress Notes (Signed)
[name] ([id number]) Visit Report for 04/30/2018 Abuse/Suicide Risk Screen Details Patient Name: [name], [name]. Date of Service: 04/30/2018 9:45 AM Medical Record Number: [id number] Patient Account Number: [id number] Date of Birth/Sex: 11/03/1926 (84 y.o. Female) Treating RN: Renne Crigler Primary Care Dondrell Loudermilk: SYSTEM, PCP Other Clinician: Referring Macenzie Burford: Treating Abisola Carrero/Extender: Kathreen Cosier in Treatment: 0 Abuse/Suicide Risk Screen Items Answer ABUSE/SUICIDE RISK SCREEN: Has anyone close to you tried to hurt or harm you recentlyo No Do you feel uncomfortable with anyone in your familyo No Has anyone forced you do things that you didnot want to doo No Do you have any thoughts of harming yourselfo No Patient displays signs or symptoms of abuse and/or neglect. No Electronic Signature(s) Signed: 04/30/2018 4:27:05 PM By: Renne Crigler Entered By: Renne Crigler on 04/30/2018 10:20:58 Lindsay Monte ([id number]) -------------------------------------------------------------------------------- Activities of Daily Living Details Patient Name: [name], [name] Date of Service: 04/30/2018 9:45 AM Medical Record Number: [id number] Patient Account Number: [id number] Date of Birth/Sex: 1926-05-08 (84 y.o. Female) Treating RN: Renne Crigler Primary Care Rease Swinson: SYSTEM, PCP Other Clinician: Referring Gwendlyon Zumbro: Treating Jahad Old/Extender: Kathreen Cosier in Treatment: 0 Activities of Daily Living Items Answer Activities of Daily Living (Please select one for each item) Drive Automobile Not Able Take Medications Need Assistance Use Telephone Need Assistance Care for Appearance Need Assistance Use Toilet Need Assistance Bath / Shower Need Assistance Dress Self Need Assistance Feed Self Need Assistance Walk Need Assistance Get In / Out Bed Need Assistance Housework Need Assistance Prepare Meals Need Assistance Handle Money Need  Assistance Shop for Self Need Assistance Electronic Signature(s) Signed: 04/30/2018 4:27:05 PM By: Renne Crigler Entered By: Renne Crigler on 04/30/2018 10:21:52 Manring, Carlene Coria ([id number]) -------------------------------------------------------------------------------- Education Assessment Details Patient Name: Lindsay Monte. Date of Service: 04/30/2018 9:45 AM Medical Record Number: [id number] Patient Account Number: [id number] Date of Birth/Sex: September 18, 1926 (84 y.o. Female) Treating RN: Renne Crigler Primary Care Takeshia Wenk: SYSTEM, PCP Other Clinician: Referring Ahmani Prehn: Treating Robynn Marcel/Extender: Kathreen Cosier in Treatment: 0 Primary Learner Assessed: Patient Learning Preferences/Education Level/Primary Language Learning Preference: Explanation Highest Education Level: College or Above Preferred Language: English Cognitive Barrier Assessment/Beliefs Language Barrier: No Translator Needed: No Memory Deficit: No Emotional Barrier: No Cultural/Religious Beliefs Affecting Medical Care: No Physical Barrier Assessment Impaired Vision: Yes Glasses Impaired Hearing: No Decreased Hand dexterity: No Knowledge/Comprehension Assessment Knowledge Level: High Comprehension Level: High Ability to understand written High instructions: Ability to understand verbal High instructions: Motivation Assessment Anxiety Level: Calm Cooperation: Cooperative Education Importance: Acknowledges Need Interest in Health Problems: Asks Questions Perception: Coherent Willingness to Engage in Self- High Management Activities: Readiness to Engage in Self- High Management Activities: Electronic Signature(s) Signed: 04/30/2018 4:27:05 PM By: Renne Crigler Entered By: Renne Crigler on 04/30/2018 10:26:18 LAMARA, BRECHT ([id number]) -------------------------------------------------------------------------------- Fall Risk Assessment Details Patient Name:  Lindsay Monte. Date of Service: 04/30/2018 9:45 AM Medical Record Number: [id number] Patient Account Number: [id number] Date of Birth/Sex: 1925-12-12 (84 y.o. Female) Treating RN: Renne Crigler Primary Care Ramya Vanbergen: SYSTEM, PCP Other Clinician: Referring Garett Tetzloff: Treating Lakeysha Slutsky/Extender: Kathreen Cosier in Treatment: 0 Fall Risk Assessment Items Have you had 2 or more falls in the last 12 monthso 0 Yes Have you had any fall that resulted in injury in the last 12 monthso 0 Yes FALL RISK ASSESSMENT: History of falling - immediate or within 3 months 25 Yes Secondary diagnosis 0 No Ambulatory aid None/bed rest/wheelchair/nurse 0 Yes Crutches/cane/walker 0 No Furniture 0 No IV Access/Saline Lock 0 No Gait/Training Normal/bed rest/immobile 0 Yes  Weak 0 No Impaired 0 No Mental Status Oriented to own ability 0 Yes Electronic Signature(s) Signed: 04/30/2018 4:27:05 PM By: Renne Crigler Entered By: Renne Crigler on 04/30/2018 10:26:46 Decoteau, Vinnie Rexene Edison ([id number]) -------------------------------------------------------------------------------- Foot Assessment Details Patient Name: [name], [name] Date of Service: 04/30/2018 9:45 AM Medical Record Number: [id number] Patient Account Number: [id number] Date of Birth/Sex: 11-06-1926 (84 y.o. Female) Treating RN: Renne Crigler Primary Care Malloree Raboin: SYSTEM, PCP Other Clinician: Referring Villa Burgin: Treating Jianna Drabik/Extender: Kathreen Cosier in Treatment: 0 Foot Assessment Items Site Locations + = Sensation present, - = Sensation absent, C = Callus, U = Ulcer R = Redness, W = Warmth, [name] = Maceration, PU = Pre-ulcerative lesion F = Fissure, S = Swelling, D = Dryness Assessment Right: Left: Other Deformity: No No Prior Foot Ulcer: No No Prior Amputation: No No Charcot Joint: No No Ambulatory Status: Ambulatory With Help Assistance Device: Wheelchair Gait: Academic librarian Signature(s) Signed:  04/30/2018 4:27:05 PM By: Renne Crigler Entered By: Renne Crigler on 04/30/2018 10:28:58 Ellers, Carlene Coria ([id number]) -------------------------------------------------------------------------------- Nutrition Risk Assessment Details Patient Name: Rawdon, Mandie H. Date of Service: 04/30/2018 9:45 AM Medical Record Number: [id number] Patient Account Number: [id number] Date of Birth/Sex: 07-14-1926 (84 y.o. Female) Treating RN: Renne Crigler Primary Care Sherrita Riederer: SYSTEM, PCP Other Clinician: Referring Sade Mehlhoff: Treating Abisola Carrero/Extender: Kathreen Cosier in Treatment: 0 Height (in): Weight (lbs): Body Mass Index (BMI): Nutrition Risk Assessment Items NUTRITION RISK SCREEN: I have an illness or condition that made me change the kind and/or amount of 0 No food I eat I eat fewer than two meals per day 3 Yes I eat few fruits and vegetables, or milk products 2 Yes I have three or more drinks of beer, liquor or wine almost every day 0 No I have tooth or mouth problems that make it hard for me to eat 2 Yes I don't always have enough money to buy the food I need 0 No I eat alone most of the time 0 No I take three or more different prescribed or over-the-counter drugs a day 0 No Without wanting to, I have lost or gained 10 pounds in the last six months 0 No I am not always physically able to shop, cook and/or feed myself 0 No Nutrition Protocols Good Risk Protocol Moderate Risk Protocol Provide education on High Risk Proctocol 0 Electronic Signature(s) Signed: 04/30/2018 4:27:05 PM By: Renne Crigler Entered By: Renne Crigler on 04/30/2018 10:27:34

## 2018-05-03 NOTE — Progress Notes (Addendum)
MYRIKAL, MESSMER ([id number]) Visit Report for 04/30/2018 Chief Complaint Document Details Patient Name: [name], [name]. Date of Service: 04/30/2018 9:45 AM Medical Record Number: [id number] Patient Account Number: [id number] Date of Birth/Sex: Dec 15, 1925 (84 y.o. Female) Treating RN: Curtis Sites Primary Care Provider: SYSTEM, PCP Other Clinician: Referring Provider: Treating Provider/Extender: Kathreen Cosier in Treatment: 0 Information Obtained from: Patient Chief Complaint She is here for multiple wounds Electronic Signature(s) Signed: 04/30/2018 10:19:19 AM By: Bonnell Public Entered By: Bonnell Public on 04/30/2018 10:19:18 Sheldon, Carlene Coria ([id number]) -------------------------------------------------------------------------------- Debridement Details Patient Name: [name]. Date of Service: 04/30/2018 9:45 AM Medical Record Number: [id number] Patient Account Number: [id number] Date of Birth/Sex: 1926/02/13 (84 y.o. Female) Treating RN: Curtis Sites Primary Care Provider: SYSTEM, PCP Other Clinician: Referring Provider: Treating Provider/Extender: Kathreen Cosier in Treatment: 0 Debridement Performed for Wound #2 Left Calcaneus Assessment: Performed By: Physician Bonnell Public, NP Debridement Type: Debridement Pre-procedure Verification/Time Yes - 10:55 Out Taken: Start Time: 10:55 Pain Control: Lidocaine 4% Topical Solution Total Area Debrided (L x W): 0.4 (cm) x 0.5 (cm) = 0.2 (cm) Tissue and other material Non-Viable, Slough, Subcutaneous, Slough debrided: Level: Skin/Subcutaneous Tissue Debridement Description: Excisional Instrument: Curette Bleeding: Minimum Hemostasis Achieved: Pressure End Time: 10:57 Procedural Pain: 0 Post Procedural Pain: 0 Response to Treatment: Procedure was tolerated well Level of Consciousness: Awake and Alert Post Procedure Vitals: Temperature: 97.7 Pulse: 72 Respiratory Rate: 18 Blood  Pressure: Systolic Blood Pressure: 184 Diastolic Blood Pressure: 75 Post Debridement Measurements of Total Wound Length: (cm) 0.4 Stage: Category/Stage III Width: (cm) 0.5 Depth: (cm) 0.2 Volume: (cm) 0.031 Character of Wound/Ulcer Post Improved Debridement: Post Procedure Diagnosis Same as Pre-procedure Electronic Signature(s) Signed: 04/30/2018 1:04:00 PM By: Bonnell Public Signed: 04/30/2018 4:22:23 PM By: Curtis Sites Entered By: Bonnell Public on 04/30/2018 13:04:00 KAIAH, HOSEA ([id number]) AVIS, TIRONE ([id number]) -------------------------------------------------------------------------------- Debridement Details Patient Name: [name]. Date of Service: 04/30/2018 9:45 AM Medical Record Number: [id number] Patient Account Number: [id number] Date of Birth/Sex: [birth year] (84 y.o. Female) Treating RN: Curtis Sites Primary Care Provider: SYSTEM, PCP Other Clinician: Referring Provider: Treating Provider/Extender: Kathreen Cosier in Treatment: 0 Debridement Performed for Wound #1 Right,Posterior Lower Leg Assessment: Performed By: Physician Bonnell Public, NP Debridement Type: Debridement Pre-procedure Verification/Time Yes - 10:57 Out Taken: Start Time: 10:57 Pain Control: Lidocaine 4% Topical Solution Total Area Debrided (L x W): 3.5 (cm) x 1 (cm) = 3.5 (cm) Tissue and other material Non-Viable, Slough, Slough debrided: Level: Non-Viable Tissue Debridement Description: Selective/Open Wound Instrument: Curette Bleeding: Minimum Hemostasis Achieved: Pressure End Time: 10:59 Procedural Pain: 0 Post Procedural Pain: 0 Response to Treatment: Procedure was tolerated well Level of Consciousness: Awake and Alert Post Procedure Vitals: Temperature: 97.7 Pulse: 72 Respiratory Rate: 18 Blood Pressure: Systolic Blood Pressure: 184 Diastolic Blood Pressure: 75 Post Debridement Measurements of Total Wound Length: (cm) 3.5 Stage:  Unstageable/Unclassified Width: (cm) 1 Depth: (cm) 0.2 Volume: (cm) 0.55 Character of Wound/Ulcer Post Improved Debridement: Post Procedure Diagnosis Same as Pre-procedure Electronic Signature(s) Signed: 04/30/2018 4:34:47 PM By: Bonnell Public Signed: 05/03/2018 5:19:07 PM By: Curtis Sites Previous Signature: 04/30/2018 4:22:23 PM Version By: Curtis Sites Entered By: Bonnell Public on 04/30/2018 16:34:47 MILLISA, GIARRUSSO ([id number]) Mccormac, Maison H. ([id number]) -------------------------------------------------------------------------------- HPI Details Patient Name: [name], [name] Date of Service: 04/30/2018 9:45 AM Medical Record Number: [id number] Patient Account Number: [id number] Date of Birth/Sex: 1926-04-05 (84 y.o. Female) Treating RN: Curtis Sites Primary Care Provider: SYSTEM, PCP Other Clinician: Referring Provider: Treating Provider/Extender: Bonnell Public  Weeks in Treatment: 0 History of Present Illness HPI Description: 04/30/18- She is here for initial evaluation of right posterior calf and left heel pressure ulcers. She has a documented history of dementia but presents today alert and oriented o3, confused with time frame/dates pertaining to recent injury, hospitalization and placementotherwise she appears appropriate to make decisions at today's visit. On 01/13/18 she was admitted to [facility] after a fall, she sustained a closed fracture to the proximal tib-fib on the right side. She was treated nonsurgically with a full-length leg immobilizer/brace and NWB status. She began to complain of pain to the posterior aspect of the right leg, on 5/21 she was noted to have a pressure injury, the patient states that she was exchanged to a shorter knee immobilizer on Tuesday 6/4. This is currently being treated with Santyl at the nursing home, she was transitioned from rehabilitation to long-term care at [facility] [place]. She also presents  with a left heel pressure injury of unknown chronicity; there is no record of it in nursing home documentation. She had a blister noted to the left great toe, according to nursing home documentation, on 3/7. This now appears as a reabsorbing blood blister/deep tissue injury and is not currently open. She denies wearing any heel open offloading boots for heel pressure relief. She has been advised to keep pressure off of left heel and right posterior leg at all times; offloading heel boots have been ordered for facility to provide. She is currently on Bactrim for culture (5/31) with trimeth/sulfa sensitive staph species (coag neg). She does admit to loose stools, 1-3 per day; will monitor next week for improvement and consider testing for c-diff. She is a DNR/Pallitive care per facility records. She does have reduced ABI in office today and we discussed the potential for delayed healing given this result, the risk for deterioration given the level and possible need for future testing/intervention. It is her decision that we treat conservatively at this time, not sending her for additional tests. Electronic Signature(s) Signed: 04/30/2018 4:39:52 PM By: Bonnell Public Previous Signature: 04/30/2018 4:33:26 PM Version By: Bonnell Public Previous Signature: 04/30/2018 10:20:54 AM Version By: Bonnell Public Entered By: Bonnell Public on 04/30/2018 16:39:52 Goedecke, Carlene Coria ([id number]) -------------------------------------------------------------------------------- Physician Orders Details Patient Name: [name]. Date of Service: 04/30/2018 9:45 AM Medical Record Number: [id number] Patient Account Number: [id number] Date of Birth/Sex: 04-09-1926 (84 y.o. Female) Treating RN: Curtis Sites Primary Care Provider: SYSTEM, PCP Other Clinician: Referring Provider: Treating Provider/Extender: Kathreen Cosier in Treatment: 0 Verbal / Phone Orders: No Diagnosis Coding ICD-10 Coding Code  Description [phone] Pressure ulcer of left heel, stage 3 L89.95 Pressure ulcer of unspecified site, unstageable I87.2 Venous insufficiency (chronic) (peripheral) N18.3 Chronic kidney disease, stage 3 (moderate) L08.9 Local infection of the skin and subcutaneous tissue, unspecified Wound Cleansing Wound #1 Right,Lateral,Posterior Lower Leg o Clean wound with Normal Saline. o May Shower, gently pat wound dry prior to applying new dressing. Wound #2 Left Calcaneus o Clean wound with Normal Saline. o May Shower, gently pat wound dry prior to applying new dressing. Anesthetic (add to Medication List) Wound #1 Right,Lateral,Posterior Lower Leg o Topical Lidocaine 4% cream applied to wound bed prior to debridement (In Clinic Only). Wound #2 Left Calcaneus o Topical Lidocaine 4% cream applied to wound bed prior to debridement (In Clinic Only). Primary Wound Dressing Wound #1 Right,Lateral,Posterior Lower Leg o Santyl Ointment Wound #2 Left Calcaneus o Santyl Ointment Secondary Dressing Wound #1 Right,Lateral,Posterior Lower  Leg o Dry Gauze o Boardered Foam Dressing Wound #2 Left Calcaneus o Dry Gauze o Boardered Foam Dressing Dressing Change Frequency Wound #1 Right,Lateral,Posterior Lower Leg Lamore, Malachi H. ([id number]) o Change dressing every day. Wound #2 Left Calcaneus o Change dressing every day. Follow-up Appointments Wound #1 Right,Lateral,Posterior Lower Leg o Return Appointment in 1 week. Wound #2 Left Calcaneus o Return Appointment in 1 week. Edema Control Wound #1 Right,Lateral,Posterior Lower Leg o Elevate legs to the level of the heart and pump ankles as often as possible Wound #2 Left Calcaneus o Elevate legs to the level of the heart and pump ankles as often as possible Off-Loading Wound #1 Right,Lateral,Posterior Lower Leg o Other: - PLEASE PROVIDE PATIENT WITH BILATERAL PREVALON BOOTS FOR PATIENT TO WEAR AT ALL TIMES  WHILE IN BED Wound #2 Left Calcaneus o Other: - PLEASE PROVIDE PATIENT WITH BILATERAL PREVALON BOOTS FOR PATIENT TO WEAR AT ALL TIMES WHILE IN BED Additional Orders / Instructions Wound #1 Right,Lateral,Posterior Lower Leg o Increase protein intake. o Activity as tolerated o Other: - Please add facility wound healing vitamins per facility protocol Wound #2 Left Calcaneus o Increase protein intake. o Activity as tolerated o Other: - Please add facility wound healing vitamins per facility protocol Electronic Signature(s) Signed: 04/30/2018 4:22:23 PM By: Curtis Sites Signed: 04/30/2018 4:45:12 PM By: Bonnell Public Entered By: Curtis Sites on 04/30/2018 11:03:13 Pouncey, Hydee H. ([id number]) -------------------------------------------------------------------------------- Problem List Details Patient Name: Frickey, Natilie H. Date of Service: 04/30/2018 9:45 AM Medical Record Number: [id number] Patient Account Number: [id number] Date of Birth/Sex: [birth year] (84 y.o. Female) Treating RN: Curtis Sites Primary Care Provider: SYSTEM, PCP Other Clinician: Referring Provider: Treating Provider/Extender: Kathreen Cosier in Treatment: 0 Active Problems ICD-10 Impacting Encounter Code Description Active Date Wound Healing Diagnosis L89.623 Pressure ulcer of left heel, stage 3 04/30/2018 No Yes L89.95 Pressure ulcer of unspecified site, unstageable 04/30/2018 No Yes I87.2 Venous insufficiency (chronic) (peripheral) 04/30/2018 No Yes N18.3 Chronic kidney disease, stage 3 (moderate) 04/30/2018 No Yes L08.9 Local infection of the skin and subcutaneous tissue, 04/30/2018 No Yes unspecified Inactive Problems Resolved Problems Electronic Signature(s) Signed: 04/30/2018 12:48:01 PM By: Bonnell Public Previous Signature: 04/30/2018 10:18:46 AM Version By: Bonnell Public Entered By: Bonnell Public on 04/30/2018 12:48:01 Mix, Janaa Rexene Edison  ([id number]) -------------------------------------------------------------------------------- Progress Note Details Patient Name: [name]. Date of Service: 04/30/2018 9:45 AM Medical Record Number: [id number] Patient Account Number: [id number] Date of Birth/Sex: November 06, 1926 (84 y.o. Female) Treating RN: Curtis Sites Primary Care Provider: SYSTEM, PCP Other Clinician: Referring Provider: Treating Provider/Extender: Kathreen Cosier in Treatment: 0 Subjective Chief Complaint Information obtained from Patient She is here for multiple wounds History of Present Illness (HPI) 04/30/18- She is here for initial evaluation of right posterior calf and left heel pressure ulcers. She has a documented history of dementia but presents today alert and oriented o3, confused with time frame/dates pertaining to recent injury, hospitalization and placementotherwise she appears appropriate to make decisions at today's visit. On 01/13/18 she was admitted to [facility] after a fall, she sustained a closed fracture to the proximal tib-fib on the right side. She was treated nonsurgically with a full-length leg immobilizer/brace and NWB status. She began to complain of pain to the posterior aspect of the right leg, on 5/21 she was noted to have a pressure injury, the patient states that she was exchanged to a shorter knee immobilizer on Tuesday 6/4. This is currently being treated with Santyl at the nursing home, she was  transitioned from rehabilitation to long-term care at [facility] [place]. She also presents with a left heel pressure injury of unknown chronicity; there is no record of it in nursing home documentation. She had a blister noted to the left great toe, according to nursing home documentation, on 3/7. This now appears as a reabsorbing blood blister/deep tissue injury and is not currently open. She denies wearing any heel open offloading boots for heel pressure  relief. She has been advised to keep pressure off of left heel and right posterior leg at all times; offloading heel boots have been ordered for facility to provide. She is currently on Bactrim for culture (5/31) with trimeth/sulfa sensitive staph species (coag neg). She does admit to loose stools, 1-3 per day; will monitor next week for improvement and consider testing for c-diff. She is a DNR/Pallitive care per facility records. She does have reduced ABI in office today and we discussed the potential for delayed healing given this result, the risk for deterioration given the level and possible need for future testing/intervention. It is her decision that we treat conservatively at this time, not sending her for additional tests. Wound History Patient presents with 2 open wounds that have been present for approximately 2 weeks. Patient has been treating wounds in the following manner: 2 weeks. Laboratory tests have not been performed in the last month. Patient reportedly has not tested positive for an antibiotic resistant organism. Patient reportedly has not tested positive for osteomyelitis. Patient reportedly has not had testing performed to evaluate circulation in the legs. Patient History Allergies No Known Drug Allergies Family History Diabetes - Father, Heart Disease - Father, Kidney Disease - Mother, No family history of Cancer, Hypertension, Lung Disease, Seizures, Stroke, Thyroid Problems, Tuberculosis. Social History Former smoker - 20 years stopped, Marital Status - Widowed, Alcohol Use - Rarely, Drug Use - No History, Caffeine Use - Daily. Medical History Eyes Patient has history of Cataracts - one eye Joffe, Briyanna H. ([id number]) Denies history of Glaucoma, Optic Neuritis Ear/Nose/Mouth/Throat Denies history of Chronic sinus problems/congestion, Middle ear problems Hematologic/Lymphatic Patient has history of Lymphedema Denies history of Anemia, Hemophilia, Human  Immunodeficiency Virus, Sickle Cell Disease Respiratory Denies history of Aspiration, Asthma, Chronic Obstructive Pulmonary Disease (COPD), Pneumothorax, Sleep Apnea, Tuberculosis Cardiovascular Patient has history of Hypertension Denies history of Angina, Arrhythmia, Coronary Artery Disease, Deep Vein Thrombosis, Hypotension, Myocardial Infarction, Peripheral Arterial Disease, Peripheral Venous Disease, Phlebitis, Vasculitis Gastrointestinal Denies history of Cirrhosis , Colitis, Crohn s, Hepatitis A, Hepatitis B, Hepatitis C Endocrine Denies history of Type I Diabetes, Type II Diabetes Genitourinary Denies history of End Stage Renal Disease Immunological Denies history of Lupus Erythematosus, Raynaud s, Scleroderma Integumentary (Skin) Denies history of History of Burn, History of pressure wounds Musculoskeletal Patient has history of Rheumatoid Arthritis Denies history of Gout, Osteoarthritis, Osteomyelitis Neurologic Denies history of Dementia, Neuropathy, Quadriplegia, Paraplegia, Seizure Disorder Psychiatric Denies history of Anorexia/bulimia, Confinement Anxiety Review of Systems (ROS) Constitutional Symptoms (General Health) Denies complaints or symptoms of Fatigue, Fever, Chills, Marked Weight Change. Eyes Complains or has symptoms of Dry Eyes, Glasses / Contacts - glasses. Denies complaints or symptoms of Vision Changes. Ear/Nose/Mouth/Throat Complains or has symptoms of Difficult clearing ears - hard of hearing. Denies complaints or symptoms of Sinusitis. Hematologic/Lymphatic Denies complaints or symptoms of Bleeding / Clotting Disorders. Respiratory Denies complaints or symptoms of Chronic or frequent coughs, Shortness of Breath. Cardiovascular Complains or has symptoms of LE edema. Denies complaints or symptoms of Chest pain. Gastrointestinal Denies complaints or  symptoms of Frequent diarrhea, Nausea, Vomiting. Endocrine Denies complaints or symptoms of  Hepatitis, Thyroid disease, Polydypsia (Excessive Thirst). Genitourinary Complains or has symptoms of Incontinence/dribbling. Denies complaints or symptoms of Kidney failure/ Dialysis. Immunological Denies complaints or symptoms of Hives, Itching. Integumentary (Skin) Complains or has symptoms of Wounds. Denies complaints or symptoms of Bleeding or bruising tendency, Breakdown, Swelling. Musculoskeletal Gunby, Angles H. ([id number]) Denies complaints or symptoms of Muscle Pain, Muscle Weakness. Neurologic Denies complaints or symptoms of Numbness/parasthesias, Focal/Weakness. Oncologic The patient has no complaints or symptoms. Psychiatric Complains or has symptoms of Anxiety. Denies complaints or symptoms of Claustrophobia. Objective Constitutional Vitals Time Taken: 10:30 AM, Height: 62 in, Source: Stated, Temperature: 97.7 F, Pulse: 72 bpm, Respiratory Rate: 18 breaths/min, Blood Pressure: 184/75 mmHg. Integumentary (Hair, Skin) Wound #1 status is Open. Original cause of wound was Pressure Injury. The wound is located on the Right,Posterior Lower Leg. The wound measures 3.5cm length x 1cm width x 0.2cm depth; 2.749cm^2 area and 0.55cm^3 volume. There is Fat Layer (Subcutaneous Tissue) Exposed exposed. There is no tunneling or undermining noted. There is a large amount of serosanguineous drainage noted. The wound margin is distinct with the outline attached to the wound base. There is no granulation within the wound bed. There is a large (67-100%) amount of necrotic tissue within the wound bed including Eschar and Adherent Slough. The periwound skin appearance did not exhibit: Callus, Crepitus, Excoriation, Induration, Rash, Scarring, Dry/Scaly, Maceration, Atrophie Blanche, Cyanosis, Ecchymosis, Hemosiderin Staining, Mottled, Pallor, Rubor, Erythema. Periwound temperature was noted as No Abnormality. Wound #2 status is Open. Original cause of wound was Pressure Injury. The  wound is located on the Left Calcaneus. The wound measures 0.4cm length x 0.5cm width x 0.2cm depth; 0.157cm^2 area and 0.031cm^3 volume. There is Fat Layer (Subcutaneous Tissue) Exposed exposed. There is no tunneling or undermining noted. There is a medium amount of serosanguineous drainage noted. The wound margin is distinct with the outline attached to the wound base. There is no granulation within the wound bed. There is a large (67-100%) amount of necrotic tissue within the wound bed including Eschar and Adherent Slough. The periwound skin appearance exhibited: Callus, Induration. The periwound skin appearance did not exhibit: Crepitus, Excoriation, Rash, Scarring, Dry/Scaly, Maceration, Atrophie Blanche, Cyanosis, Ecchymosis, Hemosiderin Staining, Mottled, Pallor, Rubor, Erythema. Periwound temperature was noted as No Abnormality. The periwound has tenderness on palpation. Assessment Active Problems ICD-10 Pressure ulcer of left heel, stage 3 Pressure ulcer of unspecified site, unstageable Venous insufficiency (chronic) (peripheral) Chronic kidney disease, stage 3 (moderate) Local infection of the skin and subcutaneous tissue, unspecified Goldin, Kapri H. ([id number]) Procedures Wound #1 Pre-procedure diagnosis of Wound #1 is a Pressure Ulcer located on the Right,Posterior Lower Leg . There was a Selective/Open Wound Non-Viable Tissue Debridement with a total area of 3.5 sq cm performed by Bonnell Public, NP. With the following instrument(s): Curette to remove Non-Viable tissue/material. Material removed includes [facility] after achieving pain control using Lidocaine 4% Topical Solution. No specimens were taken. A time out was conducted at 10:57, prior to the start of the procedure. A Minimum amount of bleeding was controlled with Pressure. The procedure was tolerated well with a pain level of 0 throughout and a pain level of 0 following the procedure. Patient s Level of Consciousness  post procedure was recorded as Awake and Alert. Post Debridement Measurements: 3.5cm length x 1cm width x 0.2cm depth; 0.55cm^3 volume. Post debridement Stage noted as Unstageable/Unclassified. Character of Wound/Ulcer Post Debridement is improved. Post procedure  Diagnosis Wound #1: Same as Pre-Procedure Wound #2 Pre-procedure diagnosis of Wound #2 is a Pressure Ulcer located on the Left Calcaneus . There was a Excisional Skin/Subcutaneous Tissue Debridement with a total area of 0.2 sq cm performed by Bonnell Public, NP. With the following instrument(s): Curette to remove Non-Viable tissue/material. Material removed includes Subcutaneous Tissue and Slough and after achieving pain control using Lidocaine 4% Topical Solution. No specimens were taken. A time out was conducted at 10:55, prior to the start of the procedure. A Minimum amount of bleeding was controlled with Pressure. The procedure was tolerated well with a pain level of 0 throughout and a pain level of 0 following the procedure. Patient s Level of Consciousness post procedure was recorded as Awake and Alert. Post Debridement Measurements: 0.4cm length x 0.5cm width x 0.2cm depth; 0.031cm^3 volume. Post debridement Stage noted as Category/Stage III. Character of Wound/Ulcer Post Debridement is improved. Post procedure Diagnosis Wound #2: Same as Pre-Procedure Plan Wound Cleansing: Wound #1 Right,Lateral,Posterior Lower Leg: Clean wound with Normal Saline. May Shower, gently pat wound dry prior to applying new dressing. Wound #2 Left Calcaneus: Clean wound with Normal Saline. May Shower, gently pat wound dry prior to applying new dressing. Anesthetic (add to Medication List): Wound #1 Right,Lateral,Posterior Lower Leg: Topical Lidocaine 4% cream applied to wound bed prior to debridement (In Clinic Only). Wound #2 Left Calcaneus: Topical Lidocaine 4% cream applied to wound bed prior to debridement (In Clinic Only). Primary Wound  Dressing: Wound #1 Right,Lateral,Posterior Lower Leg: Santyl Ointment Wound #2 Left Calcaneus: Santyl Ointment Secondary Dressing: DANALEE, FLATH ([id number]) Wound #1 Right,Lateral,Posterior Lower Leg: Dry Gauze Boardered Foam Dressing Wound #2 Left Calcaneus: Dry Gauze Boardered Foam Dressing Dressing Change Frequency: Wound #1 Right,Lateral,Posterior Lower Leg: Change dressing every day. Wound #2 Left Calcaneus: Change dressing every day. Follow-up Appointments: Wound #1 Right,Lateral,Posterior Lower Leg: Return Appointment in 1 week. Wound #2 Left Calcaneus: Return Appointment in 1 week. Edema Control: Wound #1 Right,Lateral,Posterior Lower Leg: Elevate legs to the level of the heart and pump ankles as often as possible Wound #2 Left Calcaneus: Elevate legs to the level of the heart and pump ankles as often as possible Off-Loading: Wound #1 Right,Lateral,Posterior Lower Leg: Other: - PLEASE PROVIDE PATIENT WITH BILATERAL PREVALON BOOTS FOR PATIENT TO WEAR AT ALL TIMES WHILE IN BED Wound #2 Left Calcaneus: Other: - PLEASE PROVIDE PATIENT WITH BILATERAL PREVALON BOOTS FOR PATIENT TO WEAR AT ALL TIMES WHILE IN BED Additional Orders / Instructions: Wound #1 Right,Lateral,Posterior Lower Leg: Increase protein intake. Activity as tolerated Other: - Please add facility wound healing vitamins per facility protocol Wound #2 Left Calcaneus: Increase protein intake. Activity as tolerated Other: - Please add facility wound healing vitamins per facility protocol Electronic Signature(s) Signed: 04/30/2018 4:40:06 PM By: Bonnell Public Previous Signature: 04/30/2018 4:35:00 PM Version By: Bonnell Public Previous Signature: 04/30/2018 4:33:40 PM Version By: Bonnell Public Entered By: Bonnell Public on 04/30/2018 16:40:06 Cullinane, Lesleyann Rexene Edison ([id number]) -------------------------------------------------------------------------------- ROS/PFSH Details Patient Name: [name]. Date of Service: 04/30/2018 9:45 AM Medical Record Number: [id number] Patient Account Number: [id number] Date of Birth/Sex: 1926-05-13 (84 y.o. Female) Treating RN: Renne Crigler Primary Care Provider: SYSTEM, PCP Other Clinician: Referring Provider: Treating Provider/Extender: Kathreen Cosier in Treatment: 0 Wound History Do you currently have one or more open woundso Yes How many open wounds do you currently haveo 2 Approximately how long have you had your woundso 2 weeks How have you been treating your wound(s) until nowo 2  weeks Has your wound(s) ever healed and then re-openedo No Have you had any lab work done in the past montho No Have you tested positive for an antibiotic resistant organism (MRSA, VRE)o No Have you tested positive for osteomyelitis (bone infection)o No Have you had any tests for circulation on your legso No Constitutional Symptoms (General Health) Complaints and Symptoms: Negative for: Fatigue; Fever; Chills; Marked Weight Change Eyes Complaints and Symptoms: Positive for: Dry Eyes; Glasses / Contacts - glasses Negative for: Vision Changes Medical History: Positive for: Cataracts - one eye Negative for: Glaucoma; Optic Neuritis Ear/Nose/Mouth/Throat Complaints and Symptoms: Positive for: Difficult clearing ears - hard of hearing Negative for: Sinusitis Medical History: Negative for: Chronic sinus problems/congestion; Middle ear problems Hematologic/Lymphatic Complaints and Symptoms: Negative for: Bleeding / Clotting Disorders Medical History: Positive for: Lymphedema Negative for: Anemia; Hemophilia; Human Immunodeficiency Virus; Sickle Cell Disease Respiratory Complaints and Symptoms: Negative for: Chronic or frequent coughs; Shortness of Breath Medical HistoryPATRICK, SOHM ([id number]) Negative for: Aspiration; Asthma; Chronic Obstructive Pulmonary Disease (COPD); Pneumothorax; Sleep  Apnea; Tuberculosis Cardiovascular Complaints and Symptoms: Positive for: LE edema Negative for: Chest pain Medical History: Positive for: Hypertension Negative for: Angina; Arrhythmia; Coronary Artery Disease; Deep Vein Thrombosis; Hypotension; Myocardial Infarction; Peripheral Arterial Disease; Peripheral Venous Disease; Phlebitis; Vasculitis Gastrointestinal Complaints and Symptoms: Negative for: Frequent diarrhea; Nausea; Vomiting Medical History: Negative for: Cirrhosis ; Colitis; Crohnos; Hepatitis A; Hepatitis B; Hepatitis C Endocrine Complaints and Symptoms: Negative for: Hepatitis; Thyroid disease; Polydypsia (Excessive Thirst) Medical History: Negative for: Type I Diabetes; Type II Diabetes Genitourinary Complaints and Symptoms: Positive for: Incontinence/dribbling Negative for: Kidney failure/ Dialysis Medical History: Negative for: End Stage Renal Disease Immunological Complaints and Symptoms: Negative for: Hives; Itching Medical History: Negative for: Lupus Erythematosus; Raynaudos; Scleroderma Integumentary (Skin) Complaints and Symptoms: Positive for: Wounds Negative for: Bleeding or bruising tendency; Breakdown; Swelling Medical History: Negative for: History of Burn; History of pressure wounds Musculoskeletal Complaints and Symptoms: Negative for: Muscle Pain; Muscle Weakness Hopwood, Zandra H. ([id number]) Medical History: Positive for: Rheumatoid Arthritis Negative for: Gout; Osteoarthritis; Osteomyelitis Neurologic Complaints and Symptoms: Negative for: Numbness/parasthesias; Focal/Weakness Medical History: Negative for: Dementia; Neuropathy; Quadriplegia; Paraplegia; Seizure Disorder Psychiatric Complaints and Symptoms: Positive for: Anxiety Negative for: Claustrophobia Medical History: Negative for: Anorexia/bulimia; Confinement Anxiety Oncologic Complaints and Symptoms: No Complaints or Symptoms HBO Extended History  Items Eyes: Cataracts Immunizations Pneumococcal Vaccine: Received Pneumococcal Vaccination: Yes Implantable Devices Family and Social History Cancer: No; Diabetes: Yes - Father; Heart Disease: Yes - Father; Hypertension: No; Kidney Disease: Yes - Mother; Lung Disease: No; Seizures: No; Stroke: No; Thyroid Problems: No; Tuberculosis: No; Former smoker - 20 years stopped; Marital Status - Widowed; Alcohol Use: Rarely; Drug Use: No History; Caffeine Use: Daily; Financial Concerns: No; Food, Clothing or Shelter Needs: No; Support System Lacking: No; Transportation Concerns: No; Advanced Directives: Yes; Patient does not want information on Advanced Directives; Do not resuscitate: No; Living Will: Yes; Medical Power of Attorney: No Electronic Signature(s) Signed: 04/30/2018 4:27:05 PM By: Renne Crigler Signed: 04/30/2018 4:45:12 PM By: Bonnell Public Entered By: Renne Crigler on 04/30/2018 10:20:43 Bastidas, Rever Rexene Edison ([id number]) -------------------------------------------------------------------------------- SuperBill Details Patient Name: [name]. Date of Service: 04/30/2018 Medical Record Number: [id number] Patient Account Number: [id number] Date of Birth/Sex: 1926/02/18 (84 y.o. Female) Treating RN: Curtis Sites Primary Care Provider: SYSTEM, PCP Other Clinician: Referring Provider: Treating Provider/Extender: Kathreen Cosier in Treatment: 0 Diagnosis Coding ICD-10 Codes Code Description [phone] Pressure ulcer of left heel, stage 3 L89.95 Pressure ulcer of  unspecified site, unstageable I87.2 Venous insufficiency (chronic) (peripheral) N18.3 Chronic kidney disease, stage 3 (moderate) L08.9 Local infection of the skin and subcutaneous tissue, unspecified Facility Procedures CPT4 Code: 16109604 Description: 99213 - WOUND CARE VISIT-LEV 3 EST PT Modifier: Quantity: 1 CPT4 Code: [id number] Description: 11042 - DEB SUBQ TISSUE 20 SQ CM/< ICD-10 Diagnosis  Description L89.623 Pressure ulcer of left heel, stage 3 Modifier: Quantity: 1 CPT4 Code: [id number] Description: 97597 - DEBRIDE WOUND 1ST 20 SQ CM OR < ICD-10 Diagnosis Description L89.95 Pressure ulcer of unspecified site, unstageable Modifier: Quantity: 1 Physician Procedures CPT4 Code: [id number] Description: WC PHYS LEVEL 3 o NEW PT ICD-10 Diagnosis Description L89.623 Pressure ulcer of left heel, stage 3 L89.95 Pressure ulcer of unspecified site, unstageable Modifier: Quantity: 1 CPT4 Code: 7846962 Description: 11042 - WC PHYS SUBQ TISS 20 SQ CM ICD-10 Diagnosis Description L89.623 Pressure ulcer of left heel, stage 3 Modifier: Quantity: 1 CPT4 Code: [id number] Description: 97597 - WC PHYS DEBR WO ANESTH 20 SQ CM ICD-10 Diagnosis Description L89.95 Pressure ulcer of unspecified site, unstageable Modifier: Quantity: 1 Electronic Signature(s) Signed: 04/30/2018 4:34:25 PM By: Shella Maxim, Carlene Coria ([id number]) Entered By: Bonnell Public on 04/30/2018 16:34:25

## 2018-05-03 NOTE — Progress Notes (Signed)
[name] ([id number]) Visit Report for 04/30/2018 Allergy List Details Patient Name: [name], [name]. Date of Service: 04/30/2018 9:45 AM Medical Record Number: [id number] Patient Account Number: [id number] Date of Birth/Sex: [birth year] (84 y.o. Female) Treating RN: Renne Crigler Primary Care Shulamis Wenberg: SYSTEM, PCP Other Clinician: Referring Ronald Londo: Treating Erynne Kealey/Extender: Bonnell Public Weeks in Treatment: 0 Allergies Active Allergies No Known Drug Allergies Allergy Notes Electronic Signature(s) Signed: 04/30/2018 4:27:05 PM By: Renne Crigler Entered By: Renne Crigler on 04/30/2018 10:10:48 Arey, Carlene Coria ([id number]) -------------------------------------------------------------------------------- Arrival Information Details Patient Name: [name]. Date of Service: 04/30/2018 9:45 AM Medical Record Number: [id number] Patient Account Number: [id number] Date of Birth/Sex: 01/11/1926 (84 y.o. Female) Treating RN: Renne Crigler Primary Care Denay Pleitez: SYSTEM, PCP Other Clinician: Referring Cristalle Rohm: Treating Joram Venson/Extender: Kathreen Cosier in Treatment: 0 Visit Information Patient Arrived: Wheel Chair Arrival Time: 10:09 Accompanied By: self Transfer Assistance: None Patient Identification Verified: Yes Secondary Verification Process Completed: Yes Electronic Signature(s) Signed: 04/30/2018 4:27:05 PM By: Renne Crigler Entered By: Renne Crigler on 04/30/2018 10:10:13 Kley, Carlene Coria ([id number]) -------------------------------------------------------------------------------- Clinic Level of Care Assessment Details Patient Name: [name]. Date of Service: 04/30/2018 9:45 AM Medical Record Number: [id number] Patient Account Number: [id number] Date of Birth/Sex: 09-29-1926 (84 y.o. Female) Treating RN: Curtis Sites Primary Care Yaniris Braddock: SYSTEM, PCP Other Clinician: Referring Saige Canton: Treating  Dorinda Stehr/Extender: Kathreen Cosier in Treatment: 0 Clinic Level of Care Assessment Items TOOL 1 Quantity Score []  - Use when EandM and Procedure is performed on INITIAL visit 0 ASSESSMENTS - Nursing Assessment / Reassessment X - General Physical Exam (combine w/ comprehensive assessment (listed just below) when 1 20 performed on new pt. evals) X- 1 25 Comprehensive Assessment (HX, ROS, Risk Assessments, Wounds Hx, etc.) ASSESSMENTS - Wound and Skin Assessment / Reassessment []  - Dermatologic / Skin Assessment (not related to wound area) 0 ASSESSMENTS - Ostomy and/or Continence Assessment and Care []  - Incontinence Assessment and Management 0 []  - 0 Ostomy Care Assessment and Management (repouching, etc.) PROCESS - Coordination of Care X - Simple Patient / Family Education for ongoing care 1 15 []  - 0 Complex (extensive) Patient / Family Education for ongoing care []  - 0 Staff obtains Chiropractor, Records, Test Results / Process Orders []  - 0 Staff telephones HHA, Nursing Homes / Clarify orders / etc []  - 0 Routine Transfer to another Facility (non-emergent condition) []  - 0 Routine Hospital Admission (non-emergent condition) X- 1 15 New Admissions / Manufacturing engineer / Ordering NPWT, Apligraf, etc. []  - 0 Emergency Hospital Admission (emergent condition) PROCESS - Special Needs []  - Pediatric / Minor Patient Management 0 []  - 0 Isolation Patient Management []  - 0 Hearing / Language / Visual special needs []  - 0 Assessment of Community assistance (transportation, D/C planning, etc.) []  - 0 Additional assistance / Altered mentation []  - 0 Support Surface(s) Assessment (bed, cushion, seat, etc.) Pare, Taima H. ([id number]) INTERVENTIONS - Miscellaneous []  - External ear exam 0 []  - 0 Patient Transfer (multiple staff / Nurse, adult / Similar devices) []  - 0 Simple Staple / Suture removal (25 or less) []  - 0 Complex Staple / Suture removal (26 or more) []  -  0 Hypo/Hyperglycemic Management (do not check if billed separately) X- 1 15 Ankle / Brachial Index (ABI) - do not check if billed separately Has the patient been seen at the hospital within the last three years: Yes Total Score: 90 Level Of Care: New/Established - Level 3 Electronic Signature(s) Signed: 04/30/2018 4:22:23 PM By: Francesco Sor,  Mardene Celeste Entered By: Curtis Sites on 04/30/2018 11:04:01 [name] ([id number]) -------------------------------------------------------------------------------- Encounter Discharge Information Details Patient Name: [name]. Date of Service: 04/30/2018 9:45 AM Medical Record Number: [id number] Patient Account Number: [id number] Date of Birth/Sex: [birth year] (84 y.o. Female) Treating RN: Ashok Cordia, Debi Primary Care Sharmin Foulk: SYSTEM, PCP Other Clinician: Referring Margherita Collyer: Treating Greer Koeppen/Extender: Kathreen Cosier in Treatment: 0 Encounter Discharge Information Items Discharge Condition: Stable Ambulatory Status: Wheelchair Discharge Destination: Skilled Nursing Facility Telephoned: No Orders Sent: Yes Transportation: Other Accompanied By: driver Schedule Follow-up Appointment: Yes Clinical Summary of Care: Electronic Signature(s) Signed: 04/30/2018 11:49:02 AM By: Alejandro Mulling Entered By: Alejandro Mulling on 04/30/2018 11:33:55 Wenberg, Carlene Coria ([id number]) -------------------------------------------------------------------------------- Lower Extremity Assessment Details Patient Name: Cardell, Giannamarie H. Date of Service: 04/30/2018 9:45 AM Medical Record Number: [id number] Patient Account Number: [id number] Date of Birth/Sex: 1926/01/17 (84 y.o. Female) Treating RN: Renne Crigler Primary Care Danah Reinecke: SYSTEM, PCP Other Clinician: Referring Cassundra Mckeever: Treating Kalil Woessner/Extender: Kathreen Cosier in Treatment: 0 Edema Assessment Assessed: [Left: No] [Right: No] Edema: [Left: Yes] [Right: Yes] Calf Left:  Right: Point of Measurement: 26 cm From Medial Instep 23.8 cm 23.7 cm Ankle Left: Right: Point of Measurement: 10 cm From Medial Instep 19.8 cm 23.6 cm Vascular Assessment Claudication: Claudication Assessment [Left:None] [Right:None] Pulses: Dorsalis Pedis Palpable: [Left:Yes] [Right:Yes] Doppler Audible: [Left:Yes] [Right:Yes] Posterior Tibial Extremity colors, hair growth, and conditions: Extremity Color: [Left:Normal] [Right:Normal] Hair Growth on Extremity: [Left:No] [Right:No] Temperature of Extremity: [Left:Cool] [Right:Cool] Capillary Refill: [Left:< 3 seconds] [Right:< 3 seconds] Blood Pressure: Brachial: [Left:180] [Right:100] Dorsalis Pedis: 110 [Left:Dorsalis Pedis: 130] Ankle: Posterior Tibial: 100 [Left:Posterior Tibial: 128 0.61] [Right:0.72] Toe Nail Assessment Left: Right: Thick: No No Discolored: No No Deformed: No No Improper Length and Hygiene: No No Electronic Signature(s) Signed: 04/30/2018 4:27:05 PM By: Renne Crigler Entered By: Renne Crigler on 04/30/2018 10:47:45 Elsea, Carlene Coria ([id number]) Ellefson, Orah Rexene Edison ([id number]) -------------------------------------------------------------------------------- Multi Wound Chart Details Patient Name: Davee, Aeliana H. Date of Service: 04/30/2018 9:45 AM Medical Record Number: [id number] Patient Account Number: [id number] Date of Birth/Sex: 31-Mar-1926 (84 y.o. Female) Treating RN: Curtis Sites Primary Care Roneka Gilpin: SYSTEM, PCP Other Clinician: Referring Javonda Suh: Treating Lanice Folden/Extender: Kathreen Cosier in Treatment: 0 Vital Signs Height(in): 62 Pulse(bpm): 72 Weight(lbs): Blood Pressure(mmHg): 184/75 Body Mass Index(BMI): Temperature(F): 97.7 Respiratory Rate 18 (breaths/min): Photos: [1:No Photos] [2:No Photos] [N/A:N/A] Wound Location: [1:Right Lower Leg - Lateral, Posterior] [2:Left Calcaneus] [N/A:N/A] Wounding Event: [1:Trauma] [2:Pressure Injury] [N/A:N/A] Primary  Etiology: [1:Trauma, Other] [2:Pressure Ulcer] [N/A:N/A] Comorbid History: [1:Cataracts, Lymphedema, Hypertension, Rheumatoid Arthritis] [2:Cataracts, Lymphedema, Hypertension, Rheumatoid Arthritis] [N/A:N/A] Date Acquired: [1:02/08/2018] [2:04/10/2018] [N/A:N/A] Weeks of Treatment: [1:0] [2:0] [N/A:N/A] Wound Status: [1:Open] [2:Open] [N/A:N/A] Measurements L x W x D [1:3.5x1x0.2] [2:0.4x0.5x0.2] [N/A:N/A] (cm) Area (cm) : [1:2.749] [2:0.157] [N/A:N/A] Volume (cm) : [1:0.55] [2:0.031] [N/A:N/A] Classification: [1:Full Thickness Without Exposed Support Structures] [2:Category/Stage III] [N/A:N/A] Exudate Amount: [1:Large] [2:Medium] [N/A:N/A] Exudate Type: [1:Serosanguineous] [2:Serosanguineous] [N/A:N/A] Exudate Color: [1:red, brown] [2:red, brown] [N/A:N/A] Wound Margin: [1:Distinct, outline attached] [2:Distinct, outline attached] [N/A:N/A] Granulation Amount: [1:Small (1-33%)] [2:None Present (0%)] [N/A:N/A] Granulation Quality: [1:Red, Pink] [2:N/A] [N/A:N/A] Necrotic Amount: [1:Large (67-100%)] [2:Large (67-100%)] [N/A:N/A] Necrotic Tissue: [1:Adherent Slough] [2:Eschar, Adherent Slough] [N/A:N/A] Exposed Structures: [1:Fat Layer (Subcutaneous Tissue) Exposed: Yes Fascia: No Tendon: No Muscle: No Joint: No Bone: No] [2:Fat Layer (Subcutaneous Tissue) Exposed: Yes Fascia: No Tendon: No Muscle: No Joint: No Bone: No] [N/A:N/A] Epithelialization: [1:None] [2:None] [N/A:N/A] Debridement: [1:Debridement - Excisional] [2:Debridement - Excisional] [N/A:N/A] Pre-procedure [1:10:57] [2:10:55] [N/A:N/A] Verification/Time Out Taken: Pain Control: [1:Lidocaine 4%  Topical Solution Lidocaine 4% Topical Solution] [N/A:N/A] Tissue Debrided: Subcutaneous, Slough Subcutaneous, Slough N/A Level: Skin/Subcutaneous Tissue Skin/Subcutaneous Tissue N/A Debridement Area (sq cm): 3.5 0.2 N/A Instrument: Curette Curette N/A Bleeding: Minimum Minimum N/A Hemostasis Achieved: Pressure Pressure  N/A Procedural Pain: 0 0 N/A Post Procedural Pain: 0 0 N/A Debridement Treatment Procedure was tolerated well Procedure was tolerated well N/A Response: Post Debridement 3.5x1x0.2 0.4x0.5x0.2 N/A Measurements L x W x D (cm) Post Debridement Volume: 0.55 0.031 N/A (cm) Post Debridement Stage: N/A Category/Stage III N/A Periwound Skin Texture: Excoriation: No Induration: Yes N/A Induration: No Callus: Yes Callus: No Excoriation: No Crepitus: No Crepitus: No Rash: No Rash: No Scarring: No Scarring: No Periwound Skin Moisture: Maceration: No Maceration: No N/A Dry/Scaly: No Dry/Scaly: No Periwound Skin Color: Atrophie Blanche: No Atrophie Blanche: No N/A Cyanosis: No Cyanosis: No Ecchymosis: No Ecchymosis: No Erythema: No Erythema: No Hemosiderin Staining: No Hemosiderin Staining: No Mottled: No Mottled: No Pallor: No Pallor: No Rubor: No Rubor: No Temperature: No Abnormality No Abnormality N/A Tenderness on Palpation: No Yes N/A Wound Preparation: Ulcer Cleansing: Ulcer Cleansing: N/A Rinsed/Irrigated with Saline Rinsed/Irrigated with Saline Topical Anesthetic Applied: Topical Anesthetic Applied: Other: lidocaine 4% Other: lidocaine 4% Procedures Performed: Debridement Debridement N/A Treatment Notes Wound #1 (Right, Lateral, Posterior Lower Leg) 1. Cleansed with: Clean wound with Normal Saline 2. Anesthetic Topical Lidocaine 4% cream to wound bed prior to debridement 4. Dressing Applied: Santyl Ointment 5. Secondary Dressing Applied Bordered Foam Dressing Dry Gauze Wound #2 (Left Calcaneus) 1. Cleansed with: Clean wound with Normal Saline Pagan, Shunna H. ([id number]) 2. Anesthetic Topical Lidocaine 4% cream to wound bed prior to debridement 4. Dressing Applied: Santyl Ointment 5. Secondary Dressing Applied Bordered Foam Dressing Dry Gauze Electronic Signature(s) Signed: 04/30/2018 12:48:23 PM By: Bonnell Public Entered By: Bonnell Public  on 04/30/2018 12:48:23 NILEY, HELBIG ([id number]) -------------------------------------------------------------------------------- Multi-Disciplinary Care Plan Details Patient Name: [name]. Date of Service: 04/30/2018 9:45 AM Medical Record Number: [id number] Patient Account Number: [id number] Date of Birth/Sex: [birth year] (84 y.o. Female) Treating RN: Renne Crigler Primary Care Wrenly Lauritsen: SYSTEM, PCP Other Clinician: Referring Raeonna Milo: Treating Hasel Janish/Extender: Kathreen Cosier in Treatment: 0 Active Inactive ` Abuse / Safety / Falls / Self Care Management Nursing Diagnoses: Impaired physical mobility Goals: Patient will not experience any injury related to falls Date Initiated: 04/30/2018 Target Resolution Date: 05/29/2018 Goal Status: Active Interventions: Assess fall risk on admission and as needed Notes: ` Nutrition Nursing Diagnoses: Potential for alteratiion in Nutrition/Potential for imbalanced nutrition Goals: Patient/caregiver agrees to and verbalizes understanding of need to use nutritional supplements and/or vitamins as prescribed Date Initiated: 04/30/2018 Target Resolution Date: 07/16/2018 Goal Status: Active Interventions: Assess patient nutrition upon admission and as needed per policy Notes: ` Orientation to the Wound Care Program Nursing Diagnoses: Knowledge deficit related to the wound healing center program Goals: Patient/caregiver will verbalize understanding of the Wound Healing Center Program Date Initiated: 04/30/2018 Target Resolution Date: 07/30/2018 Goal Status: Active Interventions: KAILEN, NAME ([id number]) Provide education on orientation to the wound center Notes: ` Wound/Skin Impairment Nursing Diagnoses: Impaired tissue integrity Goals: Ulcer/skin breakdown will heal within 14 weeks Date Initiated: 04/30/2018 Target Resolution Date: 07/30/2018 Goal Status: Active Interventions: Assess patient/caregiver  ability to obtain necessary supplies Assess patient/caregiver ability to perform ulcer/skin care regimen upon admission and as needed Assess ulceration(s) every visit Notes: Electronic Signature(s) Signed: 04/30/2018 4:22:23 PM By: Curtis Sites Signed: 04/30/2018 4:27:05 PM By: Renne Crigler Entered By: Curtis Sites on 04/30/2018 11:04:42 Reffett, America H. (  [id number]) -------------------------------------------------------------------------------- Pain Assessment Details Patient Name: TRINATI, CIHLAR. Date of Service: 04/30/2018 9:45 AM Medical Record Number: [id number] Patient Account Number: [id number] Date of Birth/Sex: 05-24-1926 (84 y.o. Female) Treating RN: Renne Crigler Primary Care Terrica Duecker: SYSTEM, PCP Other Clinician: Referring Casady Voshell: Treating Marbeth Smedley/Extender: Kathreen Cosier in Treatment: 0 Active Problems Location of Pain Severity and Description of Pain Patient Has Paino No Site Locations Pain Management and Medication Current Pain Management: Electronic Signature(s) Signed: 04/30/2018 4:27:05 PM By: Renne Crigler Entered By: Renne Crigler on 04/30/2018 10:10:35 [name] ([id number]) -------------------------------------------------------------------------------- Patient/Caregiver Education Details Patient Name: [name]. Date of Service: 04/30/2018 9:45 AM Medical Record Number: [id number] Patient Account Number: [id number] Date of Birth/Gender: [birth year] (84 y.o. Female) Treating RN: Ashok Cordia, Debi Primary Care Physician: SYSTEM, PCP Other Clinician: Referring Physician: Treating Physician/Extender: Kathreen Cosier in Treatment: 0 Education Assessment Education Provided To: Patient Education Topics Provided Welcome To The Wound Care Center: Handouts: Welcome To The Wound Care Center Methods: Explain/Verbal Responses: State content correctly Wound/Skin Impairment: Handouts: Caring for Your Ulcer, Skin  Care Do's and Dont's, Other: change dressing as ordered Methods: Demonstration, Explain/Verbal Electronic Signature(s) Signed: 04/30/2018 11:49:02 AM By: Alejandro Mulling Entered By: Alejandro Mulling on 04/30/2018 11:34:21 Tamez, Litzi Rexene Edison ([id number]) -------------------------------------------------------------------------------- Wound Assessment Details Patient Name: Blasdel, Yuliet H. Date of Service: 04/30/2018 9:45 AM Medical Record Number: [id number] Patient Account Number: [id number] Date of Birth/Sex: Mar 01, 1926 (84 y.o. Female) Treating RN: Renne Crigler Primary Care Caton Popowski: SYSTEM, PCP Other Clinician: Referring Liel Rudden: Treating Auden Tatar/Extender: Kathreen Cosier in Treatment: 0 Wound Status Wound Number: 1 Primary Pressure Ulcer Etiology: Wound Location: Right Lower Leg - Posterior Wound Status: Open Wounding Event: Pressure Injury Comorbid Cataracts, Lymphedema, Hypertension, Date Acquired: 02/08/2018 History: Rheumatoid Arthritis Weeks Of Treatment: 0 Clustered Wound: No Photos Photo Uploaded By: Elliot Gurney, BSN, RN, CWS, Kim on 04/30/2018 15:25:22 Wound Measurements Length: (cm) 3.5 Width: (cm) 1 Depth: (cm) 0.2 Area: (cm) 2.749 Volume: (cm) 0.55 % Reduction in Area: 0% % Reduction in Volume: 0% Epithelialization: None Tunneling: No Undermining: No Wound Description Classification: Unstageable/Unclassified Wound Margin: Distinct, outline attached Exudate Amount: Large Exudate Type: Serosanguineous Exudate Color: red, brown Foul Odor After Cleansing: No Slough/Fibrino Yes Wound Bed Granulation Amount: None Present (0%) Exposed Structure Necrotic Amount: Large (67-100%) Fascia Exposed: No Necrotic Quality: Eschar, Adherent Slough Fat Layer (Subcutaneous Tissue) Exposed: Yes Tendon Exposed: No Muscle Exposed: No Joint Exposed: No Bone Exposed: No Periwound Skin Texture Kuras, Tine H. ([id number]) Texture Color No Abnormalities  Noted: No No Abnormalities Noted: No Callus: No Atrophie Blanche: No Crepitus: No Cyanosis: No Excoriation: No Ecchymosis: No Induration: No Erythema: No Rash: No Hemosiderin Staining: No Scarring: No Mottled: No Pallor: No Moisture Rubor: No No Abnormalities Noted: No Dry / Scaly: No Temperature / Pain Maceration: No Temperature: No Abnormality Wound Preparation Ulcer Cleansing: Rinsed/Irrigated with Saline Topical Anesthetic Applied: Other: lidocaine 4%, Treatment Notes Wound #1 (Right, Lateral, Posterior Lower Leg) 1. Cleansed with: Clean wound with Normal Saline 2. Anesthetic Topical Lidocaine 4% cream to wound bed prior to debridement 4. Dressing Applied: Santyl Ointment 5. Secondary Dressing Applied Bordered Foam Dressing Dry Gauze Electronic Signature(s) Signed: 04/30/2018 12:50:13 PM By: Bonnell Public Signed: 04/30/2018 4:27:05 PM By: Renne Crigler Entered By: Bonnell Public on 04/30/2018 12:50:12 MELITZA, LISIECKI ([id number]) -------------------------------------------------------------------------------- Wound Assessment Details Patient Name: Elie, Ashima H. Date of Service: 04/30/2018 9:45 AM Medical Record Number: [id number] Patient Account Number: [id number] Date of Birth/Sex: [birth year] (84 y.o. Female) Treating RN: Renne Crigler Primary Care  Maraki Macquarrie: SYSTEM, PCP Other Clinician: Referring Mena Lienau: Treating Charee Tumblin/Extender: Kathreen Cosier in Treatment: 0 Wound Status Wound Number: 2 Primary Pressure Ulcer Etiology: Wound Location: Left Calcaneus Wound Status: Open Wounding Event: Pressure Injury Comorbid Cataracts, Lymphedema, Hypertension, Date Acquired: 04/10/2018 History: Rheumatoid Arthritis Weeks Of Treatment: 0 Clustered Wound: No Photos Photo Uploaded By: Elliot Gurney, BSN, RN, CWS, Kim on 04/30/2018 15:25:22 Wound Measurements Length: (cm) 0.4 Width: (cm) 0.5 Depth: (cm) 0.2 Area: (cm) 0.157 Volume: (cm) 0.031 %  Reduction in Area: % Reduction in Volume: Epithelialization: None Tunneling: No Undermining: No Wound Description Classification: Category/Stage III Wound Margin: Distinct, outline attached Exudate Amount: Medium Exudate Type: Serosanguineous Exudate Color: red, brown Foul Odor After Cleansing: No Slough/Fibrino Yes Wound Bed Granulation Amount: None Present (0%) Exposed Structure Necrotic Amount: Large (67-100%) Fascia Exposed: No Necrotic Quality: Eschar, Adherent Slough Fat Layer (Subcutaneous Tissue) Exposed: Yes Tendon Exposed: No Muscle Exposed: No Joint Exposed: No Bone Exposed: No Periwound Skin Texture Spicher, Angalina H. ([id number]) Texture Color No Abnormalities Noted: No No Abnormalities Noted: No Callus: Yes Atrophie Blanche: No Crepitus: No Cyanosis: No Excoriation: No Ecchymosis: No Induration: Yes Erythema: No Rash: No Hemosiderin Staining: No Scarring: No Mottled: No Pallor: No Moisture Rubor: No No Abnormalities Noted: No Dry / Scaly: No Temperature / Pain Maceration: No Temperature: No Abnormality Tenderness on Palpation: Yes Wound Preparation Ulcer Cleansing: Rinsed/Irrigated with Saline Topical Anesthetic Applied: Other: lidocaine 4%, Electronic Signature(s) Signed: 04/30/2018 4:27:05 PM By: Renne Crigler Entered By: Renne Crigler on 04/30/2018 10:37:33 Rhodes, Marielis Rexene Edison ([id number]) -------------------------------------------------------------------------------- Vitals Details Patient Name: [name]. Date of Service: 04/30/2018 9:45 AM Medical Record Number: [id number] Patient Account Number: [id number] Date of Birth/Sex: 08/08/1926 (84 y.o. Female) Treating RN: Renne Crigler Primary Care Chinwe Lope: SYSTEM, PCP Other Clinician: Referring Asbury Hair: Treating Hinley Brimage/Extender: Kathreen Cosier in Treatment: 0 Vital Signs Time Taken: 10:30 Temperature (F): 97.7 Height (in): 62 Pulse (bpm): 72 Source:  Stated Respiratory Rate (breaths/min): 18 Blood Pressure (mmHg): 184/75 Reference Range: 80 - 120 mg / dl Electronic Signature(s) Signed: 04/30/2018 4:27:05 PM By: Renne Crigler Entered By: Renne Crigler on 04/30/2018 10:32:15

## 2018-05-07 ENCOUNTER — Encounter: Payer: Medicare Other | Admitting: Physician Assistant

## 2018-05-07 DIAGNOSIS — L89623 Pressure ulcer of left heel, stage 3: Secondary | ICD-10-CM | POA: Diagnosis not present

## 2018-05-11 NOTE — Progress Notes (Signed)
AMIAYAH, GIEBEL ([id number]) Visit Report for 05/07/2018 Chief Complaint Document Details Patient Name: [name], [name]. Date of Service: 05/07/2018 1:45 PM Medical Record Number: [id number] Patient Account Number: [id number] Date of Birth/Sex: Jan 06, 1926 (84 y.o. F) Treating RN: Primary Care Provider: SYSTEM, PCP Other Clinician: Referring Provider: Einar Crow Treating Provider/Extender: Linwood Dibbles, Domnic Vantol Weeks in Treatment: 1 Information Obtained from: Patient Chief Complaint She is here for multiple wounds Electronic Signature(s) Signed: 05/09/2018 11:18:14 PM By: Lenda Kelp PA-C Entered By: Lenda Kelp on 05/07/2018 13:46:26 Lacross, Kaelah Rexene Edison ([id number]) -------------------------------------------------------------------------------- HPI Details Patient Name: [name]. Date of Service: 05/07/2018 1:45 PM Medical Record Number: [id number] Patient Account Number: [id number] Date of Birth/Sex: 1926-03-21 (84 y.o. F) Treating RN: Primary Care Provider: SYSTEM, PCP Other Clinician: Referring Provider: Einar Crow Treating Provider/Extender: Linwood Dibbles, Felesha Moncrieffe Weeks in Treatment: 1 History of Present Illness HPI Description: 04/30/18- She is here for initial evaluation of right posterior calf and left heel pressure ulcers. She has a documented history of dementia but presents today alert and oriented o3, confused with time frame/dates pertaining to recent injury, hospitalization and placementotherwise she appears appropriate to make decisions at today's visit. On 01/13/18 she was admitted to [facility] after a fall, she sustained a closed fracture to the proximal tib-fib on the right side. She was treated nonsurgically with a full-length leg immobilizer/brace and NWB status. She began to complain of pain to the posterior aspect of the right leg, on 5/21 she was noted to have a pressure injury, the patient states that she  was exchanged to a shorter knee immobilizer on Tuesday 6/4. This is currently being treated with Santyl at the nursing home, she was transitioned from rehabilitation to long-term care at [facility] at [place]. She also presents with a left heel pressure injury of unknown chronicity; there is no record of it in nursing home documentation. She had a blister noted to the left great toe, according to nursing home documentation, on 3/7. This now appears as a reabsorbing blood blister/deep tissue injury and is not currently open. She denies wearing any heel open offloading boots for heel pressure relief. She has been advised to keep pressure off of left heel and right posterior leg at all times; offloading heel boots have been ordered for facility to provide. She is currently on Bactrim for culture (5/31) with trimeth/sulfa sensitive staph species (coag neg). She does admit to loose stools, 1-3 per day; will monitor next week for improvement and consider testing for c-diff. She is a DNR/Pallitive care per facility records. She does have reduced ABI in office today and we discussed the potential for delayed healing given this result, the risk for deterioration given the level and possible need for future testing/intervention. It is her decision that we treat conservatively at this time, not sending her for additional tests. 05/07/18 on evaluation today patient appears to be doing somewhat better I feel in regard to her ulcers on the left heel and the right lateral leg. She's been tolerating the dressing changes without complication with that being said she continues to have some discomfort she is also still recovering from her tib/fib fracture on the right. She has additional swelling of the right lower extremity secondary to this. No fevers, chills, nausea, or vomiting noted at this time. Electronic Signature(s) Signed: 05/09/2018 11:18:14 PM By: Lenda Kelp PA-C Entered By: Lenda Kelp on  05/07/2018 14:47:36 Dantuono, Carlene Coria ([id number]) -------------------------------------------------------------------------------- Physical Exam Details Patient Name: [name]  H. Date of Service: 05/07/2018 1:45 PM Medical Record Number: [id number] Patient Account Number: [id number] Date of Birth/Sex: Jan 09, 1926 (84 y.o. F) Treating RN: Primary Care Provider: SYSTEM, PCP Other Clinician: Referring Provider: Einar Crow Treating Provider/Extender: STONE III, Deronda Christian Weeks in Treatment: 1 Constitutional Well-nourished and well-hydrated in no acute distress. Respiratory normal breathing without difficulty. clear to auscultation bilaterally. Cardiovascular regular rate and rhythm with normal S1, S2. Psychiatric this patient is able to make decisions and demonstrates good insight into disease process. Alert and Oriented x 3. pleasant and cooperative. Notes At this point patient's wound seems to be doing fairly well in regard to the left heel. Her left great toe just have some callous which was subsequently removed and she had no discomfort this appears to be excellent and in fact is completely healed once I removed this off. No fevers, chills, nausea, or vomiting noted at this time. No debridement performed due to patient's vascular status. Electronic Signature(s) Signed: 05/09/2018 11:18:14 PM By: Lenda Kelp PA-C Entered By: Lenda Kelp on 05/07/2018 14:48:23 Lodge, Carlene Coria ([id number]) -------------------------------------------------------------------------------- Physician Orders Details Patient Name: [name]. Date of Service: 05/07/2018 1:45 PM Medical Record Number: [id number] Patient Account Number: [id number] Date of Birth/Sex: 1925/12/18 (84 y.o. F) Treating RN: Curtis Sites Primary Care Provider: SYSTEM, PCP Other Clinician: Referring Provider: Einar Crow Treating Provider/Extender: Linwood Dibbles, Naftali Carchi Weeks in Treatment:  1 Verbal / Phone Orders: No Diagnosis Coding ICD-10 Coding Code Description [phone] Pressure ulcer of left heel, stage 3 L89.95 Pressure ulcer of unspecified site, unstageable I87.2 Venous insufficiency (chronic) (peripheral) N18.3 Chronic kidney disease, stage 3 (moderate) L08.9 Local infection of the skin and subcutaneous tissue, unspecified Wound Cleansing Wound #1 Right,Posterior Lower Leg o Clean wound with Normal Saline. o May Shower, gently pat wound dry prior to applying new dressing. Wound #2 Left Calcaneus o Clean wound with Normal Saline. o May Shower, gently pat wound dry prior to applying new dressing. Anesthetic (add to Medication List) Wound #1 Right,Posterior Lower Leg o Topical Lidocaine 4% cream applied to wound bed prior to debridement (In Clinic Only). Wound #2 Left Calcaneus o Topical Lidocaine 4% cream applied to wound bed prior to debridement (In Clinic Only). Primary Wound Dressing Wound #1 Right,Posterior Lower Leg o Santyl Ointment Wound #2 Left Calcaneus o Santyl Ointment Secondary Dressing Wound #1 Right,Posterior Lower Leg o Dry Gauze o Boardered Foam Dressing Wound #2 Left Calcaneus o Dry Gauze o Boardered Foam Dressing Dressing Change Frequency Wound #1 Right,Posterior Lower Leg Dinse, Catherina H. ([id number]) o Change dressing every day. Wound #2 Left Calcaneus o Change dressing every day. Follow-up Appointments Wound #1 Right,Posterior Lower Leg o Return Appointment in 2 weeks. Wound #2 Left Calcaneus o Return Appointment in 2 weeks. Edema Control Wound #1 Right,Posterior Lower Leg o Elevate legs to the level of the heart and pump ankles as often as possible Wound #2 Left Calcaneus o Elevate legs to the level of the heart and pump ankles as often as possible Off-Loading Wound #1 Right,Posterior Lower Leg o Other: - PLEASE PROVIDE PATIENT WITH BILATERAL PREVALON BOOTS FOR PATIENT TO WEAR AT  ALL TIMES WHILE IN BED Wound #2 Left Calcaneus o Other: - PLEASE PROVIDE PATIENT WITH BILATERAL PREVALON BOOTS FOR PATIENT TO WEAR AT ALL TIMES WHILE IN BED Additional Orders / Instructions Wound #1 Right,Posterior Lower Leg o Increase protein intake. o Activity as tolerated o Other: - Please add facility wound healing vitamins per facility protocol Wound #2 Left Calcaneus o Increase  protein intake. o Activity as tolerated o Other: - Please add facility wound healing vitamins per facility protocol Electronic Signature(s) Signed: 05/09/2018 11:18:14 PM By: Lenda Kelp PA-C Entered By: Lenda Kelp on 05/07/2018 14:49:46 Boeke, Danyale Rexene Edison ([id number]) -------------------------------------------------------------------------------- Problem List Details Patient Name: Baskins, Aastha H. Date of Service: 05/07/2018 1:45 PM Medical Record Number: [id number] Patient Account Number: [id number] Date of Birth/Sex: 1926-03-13 (84 y.o. F) Treating RN: Primary Care Provider: SYSTEM, PCP Other Clinician: Referring Provider: Einar Crow Treating Provider/Extender: Linwood Dibbles, Cay Kath Weeks in Treatment: 1 Active Problems ICD-10 Impacting Encounter Code Description Active Date Wound Healing Diagnosis L89.623 Pressure ulcer of left heel, stage 3 04/30/2018 No Yes L89.95 Pressure ulcer of unspecified site, unstageable 04/30/2018 No Yes I87.2 Venous insufficiency (chronic) (peripheral) 04/30/2018 No Yes N18.3 Chronic kidney disease, stage 3 (moderate) 04/30/2018 No Yes L08.9 Local infection of the skin and subcutaneous tissue, 04/30/2018 No Yes unspecified Inactive Problems Resolved Problems Electronic Signature(s) Signed: 05/09/2018 11:18:14 PM By: Lenda Kelp PA-C Entered By: Lenda Kelp on 05/07/2018 13:46:20 Behler, Syrai Rexene Edison ([id number]) -------------------------------------------------------------------------------- Progress Note Details Patient Name:  [name]. Date of Service: 05/07/2018 1:45 PM Medical Record Number: [id number] Patient Account Number: [id number] Date of Birth/Sex: Mar 28, 1926 (84 y.o. F) Treating RN: Primary Care Provider: SYSTEM, PCP Other Clinician: Referring Provider: Einar Crow Treating Provider/Extender: Linwood Dibbles, Dru Primeau Weeks in Treatment: 1 Subjective Chief Complaint Information obtained from Patient She is here for multiple wounds History of Present Illness (HPI) 04/30/18- She is here for initial evaluation of right posterior calf and left heel pressure ulcers. She has a documented history of dementia but presents today alert and oriented o3, confused with time frame/dates pertaining to recent injury, hospitalization and placementotherwise she appears appropriate to make decisions at today's visit. On 01/13/18 she was admitted to [facility] after a fall, she sustained a closed fracture to the proximal tib-fib on the right side. She was treated nonsurgically with a full-length leg immobilizer/brace and NWB status. She began to complain of pain to the posterior aspect of the right leg, on 5/21 she was noted to have a pressure injury, the patient states that she was exchanged to a shorter knee immobilizer on Tuesday 6/4. This is currently being treated with Santyl at the nursing home, she was transitioned from rehabilitation to long-term care at [facility] at [place]. She also presents with a left heel pressure injury of unknown chronicity; there is no record of it in nursing home documentation. She had a blister noted to the left great toe, according to nursing home documentation, on 3/7. This now appears as a reabsorbing blood blister/deep tissue injury and is not currently open. She denies wearing any heel open offloading boots for heel pressure relief. She has been advised to keep pressure off of left heel and right posterior leg at all times; offloading heel boots have been  ordered for facility to provide. She is currently on Bactrim for culture (5/31) with trimeth/sulfa sensitive staph species (coag neg). She does admit to loose stools, 1-3 per day; will monitor next week for improvement and consider testing for c-diff. She is a DNR/Pallitive care per facility records. She does have reduced ABI in office today and we discussed the potential for delayed healing given this result, the risk for deterioration given the level and possible need for future testing/intervention. It is her decision that we treat conservatively at this time, not sending her for additional tests. 05/07/18 on evaluation today patient appears to be  doing somewhat better I feel in regard to her ulcers on the left heel and the right lateral leg. She's been tolerating the dressing changes without complication with that being said she continues to have some discomfort she is also still recovering from her tib/fib fracture on the right. She has additional swelling of the right lower extremity secondary to this. No fevers, chills, nausea, or vomiting noted at this time. Patient History Information obtained from Patient. Family History Diabetes - Father, Heart Disease - Father, Kidney Disease - Mother, No family history of Cancer, Hypertension, Lung Disease, Seizures, Stroke, Thyroid Problems, Tuberculosis. Social History Former smoker - 20 years stopped, Marital Status - Widowed, Alcohol Use - Rarely, Drug Use - No History, Caffeine Use - Daily. Review of Systems (ROS) Constitutional Symptoms (General Health) Denies complaints or symptoms of Fever, Chills. Respiratory The patient has no complaints or symptoms. Cardiovascular JASEY, CORTEZ ([id number]) The patient has no complaints or symptoms. Psychiatric The patient has no complaints or symptoms. Objective Constitutional Well-nourished and well-hydrated in no acute distress. Vitals Time Taken: 2:00 PM, Height: 62 in, Temperature:  97.8 F, Pulse: 73 bpm, Respiratory Rate: 18 breaths/min, Blood Pressure: 173/54 mmHg. Respiratory normal breathing without difficulty. clear to auscultation bilaterally. Cardiovascular regular rate and rhythm with normal S1, S2. Psychiatric this patient is able to make decisions and demonstrates good insight into disease process. Alert and Oriented x 3. pleasant and cooperative. General Notes: At this point patient's wound seems to be doing fairly well in regard to the left heel. Her left great toe just have some callous which was subsequently removed and she had no discomfort this appears to be excellent and in fact is completely healed once I removed this off. No fevers, chills, nausea, or vomiting noted at this time. No debridement performed due to patient's vascular status. Integumentary (Hair, Skin) Wound #1 status is Open. Original cause of wound was Pressure Injury. The wound is located on the Right,Posterior Lower Leg. The wound measures 2.8cm length x 1cm width x 0.2cm depth; 2.199cm^2 area and 0.44cm^3 volume. There is Fat Layer (Subcutaneous Tissue) Exposed exposed. There is no tunneling or undermining noted. There is a large amount of serosanguineous drainage noted. The wound margin is distinct with the outline attached to the wound base. There is small (1-33%) red, pink granulation within the wound bed. There is a large (67-100%) amount of necrotic tissue within the wound bed including Eschar and Adherent Slough. The periwound skin appearance did not exhibit: Callus, Crepitus, Excoriation, Induration, Rash, Scarring, Dry/Scaly, Maceration, Atrophie Blanche, Cyanosis, Ecchymosis, Hemosiderin Staining, Mottled, Pallor, Rubor, Erythema. Periwound temperature was noted as No Abnormality. The periwound has tenderness on palpation. Wound #2 status is Open. Original cause of wound was Pressure Injury. The wound is located on the Left Calcaneus. The wound measures 0.3cm length x 0.3cm  width x 0.1cm depth; 0.071cm^2 area and 0.007cm^3 volume. There is Fat Layer (Subcutaneous Tissue) Exposed exposed. There is no tunneling or undermining noted. There is a medium amount of serosanguineous drainage noted. The wound margin is distinct with the outline attached to the wound base. There is no granulation within the wound bed. There is a large (67-100%) amount of necrotic tissue within the wound bed including Eschar and Adherent Slough. The periwound skin appearance exhibited: Callus, Induration. The periwound skin appearance did not exhibit: Crepitus, Excoriation, Rash, Scarring, Dry/Scaly, Maceration, Atrophie Blanche, Cyanosis, Ecchymosis, Hemosiderin Staining, Mottled, Pallor, Rubor, Erythema. Periwound temperature was noted as No Abnormality. The periwound has tenderness on  palpation. DINORA, HEMM ([id number]) Assessment Active Problems ICD-10 Pressure ulcer of left heel, stage 3 Pressure ulcer of unspecified site, unstageable Venous insufficiency (chronic) (peripheral) Chronic kidney disease, stage 3 (moderate) Local infection of the skin and subcutaneous tissue, unspecified Plan Wound Cleansing: Wound #1 Right,Posterior Lower Leg: Clean wound with Normal Saline. May Shower, gently pat wound dry prior to applying new dressing. Wound #2 Left Calcaneus: Clean wound with Normal Saline. May Shower, gently pat wound dry prior to applying new dressing. Anesthetic (add to Medication List): Wound #1 Right,Posterior Lower Leg: Topical Lidocaine 4% cream applied to wound bed prior to debridement (In Clinic Only). Wound #2 Left Calcaneus: Topical Lidocaine 4% cream applied to wound bed prior to debridement (In Clinic Only). Primary Wound Dressing: Wound #1 Right,Posterior Lower Leg: Santyl Ointment Wound #2 Left Calcaneus: Santyl Ointment Secondary Dressing: Wound #1 Right,Posterior Lower Leg: Dry Gauze Boardered Foam Dressing Wound #2 Left Calcaneus: Dry  Gauze Boardered Foam Dressing Dressing Change Frequency: Wound #1 Right,Posterior Lower Leg: Change dressing every day. Wound #2 Left Calcaneus: Change dressing every day. Follow-up Appointments: Wound #1 Right,Posterior Lower Leg: Return Appointment in 2 weeks. Wound #2 Left Calcaneus: Return Appointment in 2 weeks. Edema Control: Wound #1 Right,Posterior Lower Leg: Elevate legs to the level of the heart and pump ankles as often as possible Wound #2 Left Calcaneus: Egli, Akila H. ([id number]) Elevate legs to the level of the heart and pump ankles as often as possible Off-Loading: Wound #1 Right,Posterior Lower Leg: Other: - PLEASE PROVIDE PATIENT WITH BILATERAL PREVALON BOOTS FOR PATIENT TO WEAR AT ALL TIMES WHILE IN BED Wound #2 Left Calcaneus: Other: - PLEASE PROVIDE PATIENT WITH BILATERAL PREVALON BOOTS FOR PATIENT TO WEAR AT ALL TIMES WHILE IN BED Additional Orders / Instructions: Wound #1 Right,Posterior Lower Leg: Increase protein intake. Activity as tolerated Other: - Please add facility wound healing vitamins per facility protocol Wound #2 Left Calcaneus: Increase protein intake. Activity as tolerated Other: - Please add facility wound healing vitamins per facility protocol I am going to suggest currently that we go ahead and continue with the Current wound care measures since she seems to be doing well in this regard. Patient is in agreement with the plan. We will subsequently see her back for reevaluation in two weeks time to see were things stand at that point. Please see above for specific wound care orders. We will see patient for re-evaluation in 2 week(s) here in the clinic. If anything worsens or changes patient will contact our office for additional recommendations. Electronic Signature(s) Signed: 05/09/2018 11:18:14 PM By: Lenda Kelp PA-C Entered By: Lenda Kelp on 05/07/2018 14:49:54 Bies, Carlene Coria  ([id number]) -------------------------------------------------------------------------------- ROS/PFSH Details Patient Name: [name]. Date of Service: 05/07/2018 1:45 PM Medical Record Number: [id number] Patient Account Number: [id number] Date of Birth/Sex: 02-Aug-1926 (84 y.o. F) Treating RN: Primary Care Provider: SYSTEM, PCP Other Clinician: Referring Provider: Einar Crow Treating Provider/Extender: STONE III, Avilyn Virtue Weeks in Treatment: 1 Information Obtained From Patient Wound History Do you currently have one or more open woundso Yes How many open wounds do you currently haveo 2 Approximately how long have you had your woundso 2 weeks How have you been treating your wound(s) until nowo 2 weeks Has your wound(s) ever healed and then re-openedo No Have you had any lab work done in the past montho No Have you tested positive for an antibiotic resistant organism (MRSA, VRE)o No Have you tested positive for osteomyelitis (bone infection)o No Have you  had any tests for circulation on your legso No Constitutional Symptoms (General Health) Complaints and Symptoms: Negative for: Fever; Chills Eyes Medical History: Positive for: Cataracts - one eye Negative for: Glaucoma; Optic Neuritis Ear/Nose/Mouth/Throat Medical History: Negative for: Chronic sinus problems/congestion; Middle ear problems Hematologic/Lymphatic Medical History: Positive for: Lymphedema Negative for: Anemia; Hemophilia; Human Immunodeficiency Virus; Sickle Cell Disease Respiratory Complaints and Symptoms: No Complaints or Symptoms Medical History: Negative for: Aspiration; Asthma; Chronic Obstructive Pulmonary Disease (COPD); Pneumothorax; Sleep Apnea; Tuberculosis Cardiovascular Complaints and Symptoms: No Complaints or Symptoms Medical HistoryNAYELIZ, HIPP ([id number]) Positive for: Hypertension Negative for: Angina; Arrhythmia; Coronary Artery Disease; Deep Vein Thrombosis;  Hypotension; Myocardial Infarction; Peripheral Arterial Disease; Peripheral Venous Disease; Phlebitis; Vasculitis Gastrointestinal Medical History: Negative for: Cirrhosis ; Colitis; Crohnos; Hepatitis A; Hepatitis B; Hepatitis C Endocrine Medical History: Negative for: Type I Diabetes; Type II Diabetes Genitourinary Medical History: Negative for: End Stage Renal Disease Immunological Medical History: Negative for: Lupus Erythematosus; Raynaudos; Scleroderma Integumentary (Skin) Medical History: Negative for: History of Burn; History of pressure wounds Musculoskeletal Medical History: Positive for: Rheumatoid Arthritis Negative for: Gout; Osteoarthritis; Osteomyelitis Neurologic Medical History: Negative for: Dementia; Neuropathy; Quadriplegia; Paraplegia; Seizure Disorder Psychiatric Complaints and Symptoms: No Complaints or Symptoms Medical History: Negative for: Anorexia/bulimia; Confinement Anxiety HBO Extended History Items Eyes: Cataracts Immunizations Pneumococcal Vaccine: Received Pneumococcal Vaccination: Yes Implantable Devices Family and Social History FELISIA, BALCOM ([id number]) Cancer: No; Diabetes: Yes - Father; Heart Disease: Yes - Father; Hypertension: No; Kidney Disease: Yes - Mother; Lung Disease: No; Seizures: No; Stroke: No; Thyroid Problems: No; Tuberculosis: No; Former smoker - 20 years stopped; Marital Status - Widowed; Alcohol Use: Rarely; Drug Use: No History; Caffeine Use: Daily; Financial Concerns: No; Food, Clothing or Shelter Needs: No; Support System Lacking: No; Transportation Concerns: No; Advanced Directives: Yes (Not Provided); Patient does not want information on Advanced Directives; Do not resuscitate: No; Living Will: Yes (Not Provided); Medical Power of Attorney: No Physician Affirmation I have reviewed and agree with the above information. Electronic Signature(s) Signed: 05/09/2018 11:18:14 PM By: Lenda Kelp PA-C Entered  By: Lenda Kelp on 05/07/2018 14:48:01 Soden, Carlene Coria ([id number]) -------------------------------------------------------------------------------- SuperBill Details Patient Name: [name]. Date of Service: 05/07/2018 Medical Record Number: [id number] Patient Account Number: [id number] Date of Birth/Sex: [birth year] (84 y.o. F) Treating RN: Primary Care Provider: SYSTEM, PCP Other Clinician: Referring Provider: Einar Crow Treating Provider/Extender: Linwood Dibbles, Anjuli Gemmill Weeks in Treatment: 1 Diagnosis Coding ICD-10 Codes Code Description [phone] Pressure ulcer of left heel, stage 3 L89.95 Pressure ulcer of unspecified site, unstageable I87.2 Venous insufficiency (chronic) (peripheral) N18.3 Chronic kidney disease, stage 3 (moderate) L08.9 Local infection of the skin and subcutaneous tissue, unspecified Facility Procedures CPT4 Code: [id number] Description: 99213 - WOUND CARE VISIT-LEV 3 EST PT Modifier: Quantity: 1 Physician Procedures CPT4 Code: [id number] Description: 99213 - WC PHYS LEVEL 3 - EST PT ICD-10 Diagnosis Description L89.623 Pressure ulcer of left heel, stage 3 I87.2 Venous insufficiency (chronic) (peripheral) L89.95 Pressure ulcer of unspecified site, unstageable N18.3 Chronic kidney disease,  stage 3 (moderate) Modifier: Quantity: 1 Electronic Signature(s) Signed: 05/09/2018 11:18:14 PM By: Lenda Kelp PA-C Entered By: Lenda Kelp on 05/07/2018 14:50:13

## 2018-05-12 NOTE — Progress Notes (Signed)
CAMILE, ESTERS ([id number]) Visit Report for 05/07/2018 Arrival Information Details Patient Name: [name], [name]. Date of Service: 05/07/2018 1:45 PM Medical Record Number: [id number] Patient Account Number: [id number] Date of Birth/Sex: [birth year] (84 y.o. F) Treating RN: [name] Primary Care [name]: SYSTEM, PCP Other Clinician: Referring [name]: [name] Treating [name]/Extender: [name], HOYT Weeks in Treatment: 1 Visit Information History Since Last Visit All ordered tests and consults were completed: No Patient Arrived: Wheel Chair Added or deleted any medications: No Arrival Time: 13:58 Any new allergies or adverse reactions: No Accompanied By: sister n law Had a fall or experienced change in No Transfer Assistance: None activities of daily living that may affect risk of falls: Signs or symptoms of abuse/neglect since last visito No Hospitalized since last visit: No Implantable device outside of the clinic excluding No cellular tissue based products placed in the center since last visit: Pain Present Now: Yes Electronic Signature(s) Signed: 05/07/2018 4:53:10 PM By: [name] Entered By: [name] on 05/07/2018 13:59:57 [name] ([id number]) -------------------------------------------------------------------------------- Clinic Level of Care Assessment Details Patient Name: [name], [name]. Date of Service: 05/07/2018 1:45 PM Medical Record Number: [id number] Patient Account Number: [id number] Date of Birth/Sex: Feb 22, 1926 (84 y.o. F) Treating RN: [name] Primary Care [name]: SYSTEM, PCP Other Clinician: Referring [name]: [name] Treating [name]/Extender: [name], HOYT Weeks in Treatment: 1 Clinic Level of Care Assessment Items TOOL 4 Quantity Score []  - Use when only an EandM is performed on FOLLOW-UP visit 0 ASSESSMENTS - Nursing Assessment / Reassessment X - Reassessment of  Co-morbidities (includes updates in patient status) 1 10 X- 1 5 Reassessment of Adherence to Treatment Plan ASSESSMENTS - Wound and Skin Assessment / Reassessment []  - Simple Wound Assessment / Reassessment - one wound 0 X- 2 5 Complex Wound Assessment / Reassessment - multiple wounds X- 1 10 Dermatologic / Skin Assessment (not related to wound area) ASSESSMENTS - Focused Assessment []  - Circumferential Edema Measurements - multi extremities 0 []  - 0 Nutritional Assessment / Counseling / Intervention X- 1 5 Lower Extremity Assessment (monofilament, tuning fork, pulses) []  - 0 Peripheral Arterial Disease Assessment (using hand held doppler) ASSESSMENTS - Ostomy and/or Continence Assessment and Care []  - Incontinence Assessment and Management 0 []  - 0 Ostomy Care Assessment and Management (repouching, etc.) PROCESS - Coordination of Care X - Simple Patient / Family Education for ongoing care 1 15 []  - 0 Complex (extensive) Patient / Family Education for ongoing care []  - 0 Staff obtains Chiropractor, Records, Test Results / Process Orders []  - 0 Staff telephones HHA, Nursing Homes / Clarify orders / etc []  - 0 Routine Transfer to another Facility (non-emergent condition) []  - 0 Routine Hospital Admission (non-emergent condition) []  - 0 New Admissions / Manufacturing engineer / Ordering NPWT, Apligraf, etc. []  - 0 Emergency Hospital Admission (emergent condition) X- 1 10 Simple Discharge Coordination [name], [name] ([id number]) []  - 0 Complex (extensive) Discharge Coordination PROCESS - Special Needs []  - Pediatric / Minor Patient Management 0 []  - 0 Isolation Patient Management []  - 0 Hearing / Language / Visual special needs []  - 0 Assessment of Community assistance (transportation, D/C planning, etc.) []  - 0 Additional assistance / Altered mentation []  - 0 Support Surface(s) Assessment (bed, cushion, seat, etc.) INTERVENTIONS - Wound Cleansing /  Measurement []  - Simple Wound Cleansing - one wound 0 X- 2 5 Complex Wound Cleansing - multiple wounds X- 1 5 Wound Imaging (photographs - any number of wounds) []  -  0 Wound Tracing (instead of photographs) []  - 0 Simple Wound Measurement - one wound X- 2 5 Complex Wound Measurement - multiple wounds INTERVENTIONS - Wound Dressings X - Small Wound Dressing one or multiple wounds 2 10 []  - 0 Medium Wound Dressing one or multiple wounds []  - 0 Large Wound Dressing one or multiple wounds []  - 0 Application of Medications - topical []  - 0 Application of Medications - injection INTERVENTIONS - Miscellaneous []  - External ear exam 0 []  - 0 Specimen Collection (cultures, biopsies, blood, body fluids, etc.) []  - 0 Specimen(s) / Culture(s) sent or taken to Lab for analysis []  - 0 Patient Transfer (multiple staff / / Similar devices) []  - 0 Simple Staple / Suture removal (25 or less) []  - 0 Complex Staple / Suture removal (26 or more) []  - 0 Hypo / Hyperglycemic Management (close monitor of Blood Glucose) []  - 0 Ankle / Brachial Index (ABI) - do not check if billed separately X- 1 5 Vital Signs [name], [name] ( ) Has the patient been seen at the hospital within the last three years: Yes Total Score: 115 Level Of Care: New/Established - Level 3 Electronic Signature(s) Signed: 05/07/2018 4:47:18 PM By: Entered By: on 05/07/2018 14:41:08 [name], ( ) -------------------------------------------------------------------------------- Encounter Discharge Information Details Patient Name: . Date of Service: 05/07/2018 1:45 PM Medical Record Number: Patient Account Number: Date of Birth/Sex: 05-13-1926 (84 y.o. F) Treating RN: [id number] Primary Care Montserrath Madding: SYSTEM, PCP Other Clinician: Referring Kamsiyochukwu Buist: 05/09/2018 Treating Noble Cicalese/Extender: [name], HOYT Weeks in Treatment: 1 Encounter Discharge Information Items Discharge Condition: Stable Ambulatory Status: Wheelchair Discharge Destination: Skilled Nursing Facility Orders Sent: Yes Transportation: Private Auto Accompanied By: caregiver Schedule Follow-up Appointment: Yes Clinical Summary of Care: Electronic Signature(s) Signed: 05/07/2018 2:51:44 PM By: 05/09/2018 Entered By: Carlene Coria on 05/07/2018 14:51:44 Minarik, Gudrun Lindsay Monte (05/09/2018) -------------------------------------------------------------------------------- Lower Extremity Assessment Details Patient Name: Oleksy, Aleksia H. Date of Service: 05/07/2018 1:45 PM Medical Record Number: [id number] Patient Account Number: [id number]/[id number] Date of Birth/Sex: 10/30/1926 (84 y.o. F) Treating RN: [name] Primary Care Alver Leete: SYSTEM, PCP Other Clinician: Referring Raenell Mensing: [name] Treating Mykia Holton/Extender: STONE III, HOYT Weeks in Treatment: 1 Edema Assessment Assessed: [Left: No] [Right: No] Edema: [Left: No] [Right: Yes] Calf Left: Right: Point of Measurement: 26 cm From Medial Instep 25 cm 26 cm Ankle Left: Right: Point of Measurement: 10 cm From Medial Instep 21 cm 23 cm Vascular Assessment Claudication: Claudication Assessment [Left:None] [Right:None] Pulses: Dorsalis Pedis Palpable: [Left:Yes] [Right:Yes] Posterior Tibial Extremity colors, hair growth, and conditions: Extremity Color: [Left:Normal] [Right:Normal] Hair Growth on Extremity: [Left:No] [Right:No] Temperature of Extremity: [Left:Warm] [Right:Warm] Capillary Refill: [Left:< 3 seconds] [Right:< 3 seconds] Toe Nail Assessment Left: Right: Thick: No No Discolored: No No Deformed: No No Improper Length and Hygiene: No No Electronic Signature(s) Signed: 05/07/2018 4:53:10 PM By: Alejandro Mulling Entered By: Alejandro Mulling on 05/07/2018 14:15:18 Lathon, Suhayla H.  (Rexene [name]) -------------------------------------------------------------------------------- Multi Wound Chart Details Patient Name: [id number]. Date of Service: 05/07/2018 1:45 PM Medical Record Number: [id number] Patient Account Number: [id number] Date of Birth/Sex: [birth year] (84 y.o. F) Treating RN: [name] Primary Care Briel Gallicchio: SYSTEM, PCP Other Clinician: Referring Vassie Kugel: [name] Treating Lasheka Kempner/Extender: STONE III, HOYT Weeks in Treatment: 1 Vital Signs Height(in): 62 Pulse(bpm): 73 Weight(lbs): Blood Pressure(mmHg): 173/54 Body Mass Index(BMI): Temperature(F): 97.8 Respiratory Rate 18 (breaths/min): Photos: [1:No Photos] [2:No Photos] [N/A:N/A] Wound Location: [1:Right Lower Leg - Posterior] [  2:Left Calcaneus] [N/A:N/A] Wounding Event: [1:Pressure Injury] [2:Pressure Injury] [N/A:N/A] Primary Etiology: [1:Pressure Ulcer] [2:Pressure Ulcer] [N/A:N/A] Comorbid History: [1:Cataracts, Lymphedema, Hypertension, Rheumatoid Arthritis] [2:Cataracts, Lymphedema, Hypertension, Rheumatoid Arthritis] [N/A:N/A] Date Acquired: [1:02/08/2018] [2:04/10/2018] [N/A:N/A] Weeks of Treatment: [1:1] [2:1] [N/A:N/A] Wound Status: [1:Open] [2:Open] [N/A:N/A] Measurements L x W x D [1:2.8x1x0.2] [2:0.3x0.3x0.1] [N/A:N/A] (cm) Area (cm) : [1:2.199] [2:0.071] [N/A:N/A] Volume (cm) : [1:0.44] [2:0.007] [N/A:N/A] % Reduction in Area: [1:20.00%] [2:54.80%] [N/A:N/A] % Reduction in Volume: [1:20.00%] [2:77.40%] [N/A:N/A] Classification: [1:Category/Stage III] [2:Category/Stage III] [N/A:N/A] Exudate Amount: [1:Large] [2:Medium] [N/A:N/A] Exudate Type: [1:Serosanguineous] [2:Serosanguineous] [N/A:N/A] Exudate Color: [1:red, brown] [2:red, brown] [N/A:N/A] Wound Margin: [1:Distinct, outline attached] [2:Distinct, outline attached] [N/A:N/A] Granulation Amount: [1:Small (1-33%)] [2:None Present (0%)] [N/A:N/A] Granulation Quality: [1:Red, Pink] [2:N/A]  [N/A:N/A] Necrotic Amount: [1:Large (67-100%)] [2:Large (67-100%)] [N/A:N/A] Necrotic Tissue: [1:Eschar, Adherent Slough] [2:Eschar, Adherent Slough] [N/A:N/A] Exposed Structures: [1:Fat Layer (Subcutaneous Tissue) Exposed: Yes Fascia: No Tendon: No Muscle: No Joint: No Bone: No] [2:Fat Layer (Subcutaneous Tissue) Exposed: Yes Fascia: No Tendon: No Muscle: No Joint: No Bone: No] [N/A:N/A] Epithelialization: [1:None] [2:None] [N/A:N/A] Periwound Skin Texture: [1:Excoriation: No Induration: No Callus: No Crepitus: No] [2:Induration: Yes Callus: Yes Excoriation: No Crepitus: No] [N/A:N/A] Rash: No Rash: No Scarring: No Scarring: No Periwound Skin Moisture: Maceration: No Maceration: No N/A Dry/Scaly: No Dry/Scaly: No Periwound Skin Color: Atrophie Blanche: No Atrophie Blanche: No N/A Cyanosis: No Cyanosis: No Ecchymosis: No Ecchymosis: No Erythema: No Erythema: No Hemosiderin Staining: No Hemosiderin Staining: No Mottled: No Mottled: No Pallor: No Pallor: No Rubor: No Rubor: No Temperature: No Abnormality No Abnormality N/A Tenderness on Palpation: Yes Yes N/A Wound Preparation: Ulcer Cleansing: Ulcer Cleansing: N/A Rinsed/Irrigated with Saline Rinsed/Irrigated with Saline Topical Anesthetic Applied: Topical Anesthetic Applied: Other: lidocaine 4% Other: lidocaine 4% Treatment Notes Electronic Signature(s) Signed: 05/07/2018 4:47:18 PM By: [name] Entered By: [name] on 05/07/2018 14:36:45 Tilson, Carlene Coria ([id number]) -------------------------------------------------------------------------------- Multi-Disciplinary Care Plan Details Patient Name: Lindsay Monte. Date of Service: 05/07/2018 1:45 PM Medical Record Number: [id number] Patient Account Number: [id number] Date of Birth/Sex: [birth year] (84 y.o. F) Treating RN: [name] Primary Care Tucker Steedley: SYSTEM, PCP Other Clinician: Referring Felita Bump: [name] Treating  Maniya Donovan/Extender: STONE III, HOYT Weeks in Treatment: 1 Active Inactive ` Abuse / Safety / Falls / Self Care Management Nursing Diagnoses: Impaired physical mobility Goals: Patient will not experience any injury related to falls Date Initiated: 04/30/2018 Target Resolution Date: 05/29/2018 Goal Status: Active Interventions: Assess fall risk on admission and as needed Notes: ` Nutrition Nursing Diagnoses: Potential for alteratiion in Nutrition/Potential for imbalanced nutrition Goals: Patient/caregiver agrees to and verbalizes understanding of need to use nutritional supplements and/or vitamins as prescribed Date Initiated: 04/30/2018 Target Resolution Date: 07/16/2018 Goal Status: Active Interventions: Assess patient nutrition upon admission and as needed per policy Notes: ` Orientation to the Wound Care Program Nursing Diagnoses: Knowledge deficit related to the wound healing center program Goals: Patient/caregiver will verbalize understanding of the Wound Healing Center Program Date Initiated: 04/30/2018 Target Resolution Date: 07/30/2018 Goal Status: Active Interventions: [name], [name] ([id number]) Provide education on orientation to the wound center Notes: ` Wound/Skin Impairment Nursing Diagnoses: Impaired tissue integrity Goals: Ulcer/skin breakdown will heal within 14 weeks Date Initiated: 04/30/2018 Target Resolution Date: 07/30/2018 Goal Status: Active Interventions: Assess patient/caregiver ability to obtain necessary supplies Assess patient/caregiver ability to perform ulcer/skin care regimen upon admission and as needed Assess ulceration(s) every visit Notes: Electronic Signature(s) Signed: 05/07/2018 4:47:18 PM By: [name] Entered By: [name] on 05/07/2018 14:35:26 Flener, Admire  HMarland Kitchen ([id number]) -------------------------------------------------------------------------------- Pain Assessment Details Patient Name: [name], [name]. Date of Service: 05/07/2018 1:45 PM Medical Record Number: [id number] Patient Account Number: [id number] Date of Birth/Sex: 01-04-1926 (84 y.o. F) Treating RN: [name] Primary Care Janaisha Tolsma: SYSTEM, PCP Other Clinician: Referring Jarelyn Bambach: [name] Treating Margaretha Mahan/Extender: [name], HOYT Weeks in Treatment: 1 Active Problems Location of Pain Severity and Description of Pain Patient Has Paino Yes Site Locations Rate the pain. Current Pain Level: 8 Character of Pain Describe the Pain: Burning, Other: stinging Pain Management and Medication Current Pain Management: Electronic Signature(s) Signed: 05/07/2018 4:53:10 PM By: [name] Entered By: [name] on 05/07/2018 14:00:22 Suttles, Carlene Coria ([id number]) -------------------------------------------------------------------------------- Patient/Caregiver Education Details Patient Name: Lindsay Monte. Date of Service: 05/07/2018 1:45 PM Medical Record Number: [id number] Patient Account Number: [id number] Date of Birth/Gender: 08-06-1926 (84 y.o. F) Treating RN: Phillis Haggis Primary Care Physician: SYSTEM, PCP Other Clinician: Referring Physician: Einar [name] Treating Physician/Extender: [name], HOYT Weeks in Treatment: 1 Education Assessment Education Provided To: Patient Education Topics Provided Wound/Skin Impairment: Handouts: Caring for Your Ulcer, Other: change dressing as ordered Methods: Demonstration, Explain/Verbal Responses: State content correctly Electronic Signature(s) Signed: 05/10/2018 5:21:58 PM By: Alejandro Mulling Entered By: Alejandro Mulling on 05/07/2018 14:52:12 Vera, Anberlyn H. ([id number]) -------------------------------------------------------------------------------- Wound Assessment Details Patient Name: Pumphrey, Taneasha H. Date of Service: 05/07/2018 1:45 PM Medical Record Number: [id number] Patient Account Number: [id number] Date of  Birth/Sex: [birth year] (84 y.o. F) Treating RN: [name] Primary Care Mistey Hoffert: SYSTEM, PCP Other Clinician: Referring Jillana Selph: [name] Treating Caley Ciaramitaro/Extender: STONE III, HOYT Weeks in Treatment: 1 Wound Status Wound Number: 1 Primary Pressure Ulcer Etiology: Wound Location: Right Lower Leg - Posterior Wound Status: Open Wounding Event: Pressure Injury Comorbid Cataracts, Lymphedema, Hypertension, Date Acquired: 02/08/2018 History: Rheumatoid Arthritis Weeks Of Treatment: 1 Clustered Wound: No Photos Photo Uploaded By: [name] on 05/07/2018 16:55:33 Wound Measurements Length: (cm) 2.8 Width: (cm) 1 Depth: (cm) 0.2 Area: (cm) 2.199 Volume: (cm) 0.44 % Reduction in Area: 20% % Reduction in Volume: 20% Epithelialization: None Tunneling: No Undermining: No Wound Description Classification: Category/Stage III Wound Margin: Distinct, outline attached Exudate Amount: Large Exudate Type: Serosanguineous Exudate Color: red, brown Foul Odor After Cleansing: No Slough/Fibrino Yes Wound Bed Granulation Amount: Small (1-33%) Exposed Structure Granulation Quality: Red, Pink Fascia Exposed: No Necrotic Amount: Large (67-100%) Fat Layer (Subcutaneous Tissue) Exposed: Yes Necrotic Quality: Eschar, Adherent Slough Tendon Exposed: No Muscle Exposed: No Joint Exposed: No Bone Exposed: No Periwound Skin Texture Matteo, Elgie H. ([id number]) Texture Color No Abnormalities Noted: No No Abnormalities Noted: No Callus: No Atrophie Blanche: No Crepitus: No Cyanosis: No Excoriation: No Ecchymosis: No Induration: No Erythema: No Rash: No Hemosiderin Staining: No Scarring: No Mottled: No Pallor: No Moisture Rubor: No No Abnormalities Noted: No Dry / Scaly: No Temperature / Pain Maceration: No Temperature: No Abnormality Tenderness on Palpation: Yes Wound Preparation Ulcer Cleansing: Rinsed/Irrigated with Saline Topical Anesthetic  Applied: Other: lidocaine 4%, Treatment Notes Wound #1 (Right, Posterior Lower Leg) 1. Cleansed with: Clean wound with Normal Saline 2. Anesthetic Topical Lidocaine 4% cream to wound bed prior to debridement 4. Dressing Applied: Santyl Ointment 5. Secondary Dressing Applied Bordered Foam Dressing Electronic Signature(s) Signed: 05/07/2018 4:53:10 PM By: [name] Entered By: [name] on 05/07/2018 14:11:57 Orrico, Alfhild Rexene [name] ([id number]) -------------------------------------------------------------------------------- Wound Assessment Details Patient Name: Ottey, Erva H. Date of Service: 05/07/2018 1:45 PM Medical Record Number: [id number] Patient Account Number: [id number] Date of Birth/Sex: 1925/12/27 (84 y.o. F) Treating RN: [name]  Primary Care Patrizia Paule: SYSTEM, PCP Other Clinician: Referring Juliani Laduke: [name] Treating Neythan Kozlov/Extender: STONE III, HOYT Weeks in Treatment: 1 Wound Status Wound Number: 2 Primary Pressure Ulcer Etiology: Wound Location: Left Calcaneus Wound Status: Open Wounding Event: Pressure Injury Comorbid Cataracts, Lymphedema, Hypertension, Date Acquired: 04/10/2018 History: Rheumatoid Arthritis Weeks Of Treatment: 1 Clustered Wound: No Photos Photo Uploaded By: [name] on 05/07/2018 16:55:34 Wound Measurements Length: (cm) 0.3 Width: (cm) 0.3 Depth: (cm) 0.1 Area: (cm) 0.071 Volume: (cm) 0.007 % Reduction in Area: 54.8% % Reduction in Volume: 77.4% Epithelialization: None Tunneling: No Undermining: No Wound Description Classification: Category/Stage III Wound Margin: Distinct, outline attached Exudate Amount: Medium Exudate Type: Serosanguineous Exudate Color: red, brown Foul Odor After Cleansing: No Slough/Fibrino Yes Wound Bed Granulation Amount: None Present (0%) Exposed Structure Necrotic Amount: Large (67-100%) Fascia Exposed: No Necrotic Quality: Eschar, Adherent  Slough Fat Layer (Subcutaneous Tissue) Exposed: Yes Tendon Exposed: No Muscle Exposed: No Joint Exposed: No Bone Exposed: No Periwound Skin Texture Muska, Elen H. ([id number]) Texture Color No Abnormalities Noted: No No Abnormalities Noted: No Callus: Yes Atrophie Blanche: No Crepitus: No Cyanosis: No Excoriation: No Ecchymosis: No Induration: Yes Erythema: No Rash: No Hemosiderin Staining: No Scarring: No Mottled: No Pallor: No Moisture Rubor: No No Abnormalities Noted: No Dry / Scaly: No Temperature / Pain Maceration: No Temperature: No Abnormality Tenderness on Palpation: Yes Wound Preparation Ulcer Cleansing: Rinsed/Irrigated with Saline Topical Anesthetic Applied: Other: lidocaine 4%, Treatment Notes Wound #2 (Left Calcaneus) 1. Cleansed with: Clean wound with Normal Saline 2. Anesthetic Topical Lidocaine 4% cream to wound bed prior to debridement 4. Dressing Applied: Santyl Ointment 5. Secondary Dressing Applied Bordered Foam Dressing Electronic Signature(s) Signed: 05/07/2018 4:53:10 PM By: [name] Entered By: [name] on 05/07/2018 14:12:20 Kozloski, Evian Rexene [name] ([id number]) -------------------------------------------------------------------------------- Vitals Details Patient Name: Lindsay Monte. Date of Service: 05/07/2018 1:45 PM Medical Record Number: [id number] Patient Account Number: [id number] Date of Birth/Sex: 1926/08/17 (84 y.o. F) Treating RN: [name] Primary Care Venba Zenner: SYSTEM, PCP Other Clinician: Referring Oreoluwa Aigner: [name] Treating Ebony Yorio/Extender: STONE III, HOYT Weeks in Treatment: 1 Vital Signs Time Taken: 14:00 Temperature (F): 97.8 Height (in): 62 Pulse (bpm): 73 Respiratory Rate (breaths/min): 18 Blood Pressure (mmHg): 173/54 Reference Range: 80 - 120 mg / dl Electronic Signature(s) Signed: 05/07/2018 4:53:10 PM By: [name] Entered By: [name] on  05/07/2018 14:00:40

## 2018-05-17 ENCOUNTER — Non-Acute Institutional Stay (SKILLED_NURSING_FACILITY): Payer: Medicare Other | Admitting: Gerontology

## 2018-05-17 ENCOUNTER — Encounter: Payer: Self-pay | Admitting: Gerontology

## 2018-05-17 DIAGNOSIS — M8000XD Age-related osteoporosis with current pathological fracture, unspecified site, subsequent encounter for fracture with routine healing: Secondary | ICD-10-CM | POA: Diagnosis not present

## 2018-05-17 DIAGNOSIS — S82201D Unspecified fracture of shaft of right tibia, subsequent encounter for closed fracture with routine healing: Secondary | ICD-10-CM | POA: Diagnosis not present

## 2018-05-17 DIAGNOSIS — L8990 Pressure ulcer of unspecified site, unspecified stage: Secondary | ICD-10-CM | POA: Diagnosis not present

## 2018-05-17 DIAGNOSIS — T85898A Other specified complication of other internal prosthetic devices, implants and grafts, initial encounter: Secondary | ICD-10-CM | POA: Diagnosis not present

## 2018-05-17 DIAGNOSIS — S82401D Unspecified fracture of shaft of right fibula, subsequent encounter for closed fracture with routine healing: Secondary | ICD-10-CM | POA: Diagnosis not present

## 2018-05-18 ENCOUNTER — Encounter: Payer: Medicare Other | Admitting: Physician Assistant

## 2018-05-18 DIAGNOSIS — L89623 Pressure ulcer of left heel, stage 3: Secondary | ICD-10-CM | POA: Diagnosis not present

## 2018-05-19 ENCOUNTER — Other Ambulatory Visit: Payer: Self-pay

## 2018-05-19 MED ORDER — OXYCODONE HCL 5 MG PO TABS: 5.0000 mg | ORAL_TABLET | ORAL | 0 refills | Status: DC | PRN

## 2018-05-19 MED ORDER — OXYCODONE HCL 5 MG PO TABS: 5.0000 mg | ORAL_TABLET | Freq: Three times a day (TID) | ORAL | 0 refills | Status: DC

## 2018-05-19 NOTE — Telephone Encounter (Signed)
Rx sent to [facility] phone : [phone] , fax : [phone]  

## 2018-05-20 NOTE — Progress Notes (Signed)
ALEKSA, COLLINSWORTH ([id number]) Visit Report for 05/18/2018 Chief Complaint Document Details Patient Name: [name], [name]. Date of Service: 05/18/2018 9:30 AM Medical Record Number: [id number] Patient Account Number: [id number] Date of Birth/Sex: 1926/08/03 (84 y.o. F) Treating RN: Phillis Haggis Primary Care Provider: SYSTEM, PCP Other Clinician: Referring Provider: Einar Crow Treating Provider/Extender: Linwood Dibbles, Bertina Guthridge Weeks in Treatment: 2 Information Obtained from: Patient Chief Complaint She is here for multiple wounds Electronic Signature(s) Signed: 05/19/2018 12:21:38 AM By: Lenda Kelp PA-C Entered By: Lenda Kelp on 05/18/2018 09:26:40 [name], [name] ([id number]) -------------------------------------------------------------------------------- Debridement Details Patient Name: [name]. Date of Service: 05/18/2018 9:30 AM Medical Record Number: [id number] Patient Account Number: [id number] Date of Birth/Sex: Jan 29, 1926 (84 y.o. F) Treating RN: Curtis Sites Primary Care Provider: SYSTEM, PCP Other Clinician: Referring Provider: Einar Crow Treating Provider/Extender: Linwood Dibbles, Barnaby Rippeon Weeks in Treatment: 2 Debridement Performed for Wound #1 Right,Posterior Lower Leg Assessment: Performed By: Clinician Curtis Sites, RN Debridement Type: Chemical/Enzymatic/Mechanical Agent Used: Santyl Pre-procedure Verification/Time Yes - 10:22 Out Taken: Start Time: 10:22 Instrument: Other : tongue depressor Bleeding: None End Time: 10:23 Procedural Pain: 0 Post Procedural Pain: 0 Response to Treatment: Procedure was tolerated well Level of Consciousness: Awake and Alert Post Procedure Vitals: Temperature: 97.5 Pulse: 83 Respiratory Rate: 18 Blood Pressure: Systolic Blood Pressure: 173 Diastolic Blood Pressure: 64 Post Debridement Measurements of Total Wound Length: (cm) 3.8 Stage: Category/Stage III Width: (cm) 1 Depth: (cm)  0.2 Volume: (cm) 0.597 Character of Wound/Ulcer Post Improved Debridement: Post Procedure Diagnosis Same as Pre-procedure Electronic Signature(s) Signed: 05/18/2018 10:30:28 AM By: Curtis Sites Signed: 05/19/2018 12:21:38 AM By: Lenda Kelp PA-C Entered By: Curtis Sites on 05/18/2018 10:30:27 [name], [name] ([id number]) -------------------------------------------------------------------------------- Debridement Details Patient Name: [name]. Date of Service: 05/18/2018 9:30 AM Medical Record Number: [id number] Patient Account Number: [id number] Date of Birth/Sex: 1926-04-17 (84 y.o. F) Treating RN: Curtis Sites Primary Care Provider: SYSTEM, PCP Other Clinician: Referring Provider: Einar Crow Treating Provider/Extender: Linwood Dibbles, Anedra Penafiel Weeks in Treatment: 2 Debridement Performed for Wound #2 Left Calcaneus Assessment: Performed By: Clinician Curtis Sites, RN Debridement Type: Chemical/Enzymatic/Mechanical Agent Used: Santyl Pre-procedure Verification/Time Yes - 10:22 Out Taken: Start Time: 10:22 Instrument: Other : tongue depressor Bleeding: None End Time: 10:23 Procedural Pain: 0 Post Procedural Pain: 0 Response to Treatment: Procedure was tolerated well Level of Consciousness: Awake and Alert Post Procedure Vitals: Temperature: 97.5 Pulse: 83 Respiratory Rate: 18 Blood Pressure: Systolic Blood Pressure: 173 Diastolic Blood Pressure: 64 Post Debridement Measurements of Total Wound Length: (cm) 0.6 Stage: Category/Stage III Width: (cm) 0.5 Depth: (cm) 0.2 Volume: (cm) 0.047 Character of Wound/Ulcer Post Improved Debridement: Post Procedure Diagnosis Same as Pre-procedure Electronic Signature(s) Signed: 05/18/2018 10:30:59 AM By: Curtis Sites Signed: 05/19/2018 12:21:38 AM By: Lenda Kelp PA-C Entered By: Curtis Sites on 05/18/2018 10:30:58 [name], [name]  ([id number]) -------------------------------------------------------------------------------- HPI Details Patient Name: [name]. Date of Service: 05/18/2018 9:30 AM Medical Record Number: [id number] Patient Account Number: [id number] Date of Birth/Sex: [birth year] (84 y.o. F) Treating RN: Phillis Haggis Primary Care Provider: SYSTEM, PCP Other Clinician: Referring Provider: Einar Crow Treating Provider/Extender: Linwood Dibbles, Ouida Abeyta Weeks in Treatment: 2 History of Present Illness HPI Description: 04/30/18- She is here for initial evaluation of right posterior calf and left heel pressure ulcers. She has a documented history of dementia but presents today alert and oriented o3, confused with time frame/dates pertaining to recent injury, hospitalization and placementotherwise she appears appropriate to make decisions at today's visit. On  01/13/18 she was admitted to [facility] after a fall, she sustained a closed fracture to the proximal tib-fib on the right side. She was treated nonsurgically with a full-length leg immobilizer/brace and NWB status. She began to complain of pain to the posterior aspect of the right leg, on 5/21 she was noted to have a pressure injury, the patient states that she was exchanged to a shorter knee immobilizer on Tuesday 6/4. This is currently being treated with Santyl at the nursing home, she was transitioned from rehabilitation to long-term care at [facility] [place]. She also presents with a left heel pressure injury of unknown chronicity; there is no record of it in nursing home documentation. She had a blister noted to the left great toe, according to nursing home documentation, on 3/7. This now appears as a reabsorbing blood blister/deep tissue injury and is not currently open. She denies wearing any heel open offloading boots for heel pressure relief. She has been advised to keep pressure off of left heel and right posterior  leg at all times; offloading heel boots have been ordered for facility to provide. She is currently on Bactrim for culture (5/31) with trimeth/sulfa sensitive staph species (coag neg). She does admit to loose stools, 1-3 per day; will monitor next week for improvement and consider testing for c-diff. She is a DNR/Pallitive care per facility records. She does have reduced ABI in office today and we discussed the potential for delayed healing given this result, the risk for deterioration given the level and possible need for future testing/intervention. It is her decision that we treat conservatively at this time, not sending her for additional tests. 05/07/18 on evaluation today patient appears to be doing somewhat better I feel in regard to her ulcers on the left heel and the right lateral leg. She's been tolerating the dressing changes without complication with that being said she continues to have some discomfort she is also still recovering from her tib/fib fracture on the right. She has additional swelling of the right lower extremity secondary to this. No fevers, chills, nausea, or vomiting noted at this time. 05/18/18 on evaluation today patient's wound on the left heel actually appears to be doing better. Unfortunately the wound on the right lateral lower extremity actually appears to be about the same maybe even a little bit more bruising noted today. Unfortunately. With that being said I think the main issue is that she's getting pressure to the site when she actually is sitting in her wheelchair which she does for most of the day according what she tells me today. I do think that a Prevalon Boots may help to offload this to a degree and this is something that I did recommend for her today. We have previously it looks like order bilateral Prevalon Boots but nonetheless she has one for the left not for the right she tells me. We were going to see if we can work on that. Electronic  Signature(s) Signed: 05/19/2018 12:21:38 AM By: Lenda Kelp PA-C Entered By: Lenda Kelp on 05/18/2018 10:12:58 [name], [name] ([id number]) -------------------------------------------------------------------------------- Physical Exam Details Patient Name: [name], [name] Date of Service: 05/18/2018 9:30 AM Medical Record Number: [id number] Patient Account Number: [id number] Date of Birth/Sex: 1926/03/27 (84 y.o. F) Treating RN: Phillis Haggis Primary Care Provider: SYSTEM, PCP Other Clinician: Referring Provider: Einar Crow Treating Provider/Extender: STONE III, Najee Manninen Weeks in Treatment: 2 Constitutional Well-nourished and well-hydrated in no acute distress. Respiratory normal breathing without difficulty. clear  to auscultation bilaterally. Cardiovascular regular rate and rhythm with normal S1, S2. Psychiatric this patient is able to make decisions and demonstrates good insight into disease process. Alert and Oriented x 3. pleasant and cooperative. Notes At this point patient's wound bed on the left heel seems to be showing signs of improvement this is good news. There still some necrotic tissue noted in the surface of the wound. In regard to the right lateral lower extremity she definitely has necrotic tissue noted along with some new contusion that I think is due to pressure. I really want her to have a Prevalon Boots to try to help with offloading although there may need to be other offloading measures put in place as well such as transferring to the bed laying on her side and doing other things to help alleviate this excess pressure. I did discuss this with the patient and she understands. Electronic Signature(s) Signed: 05/19/2018 12:21:38 AM By: Lenda Kelp PA-C Entered By: Lenda Kelp on 05/18/2018 10:13:50 [name], [name] ([id number]) -------------------------------------------------------------------------------- Physician Orders  Details Patient Name: [name]. Date of Service: 05/18/2018 9:30 AM Medical Record Number: [id number] Patient Account Number: [id number] Date of Birth/Sex: 23-Oct-1926 (84 y.o. F) Treating RN: Curtis Sites Primary Care Provider: SYSTEM, PCP Other Clinician: Referring Provider: Einar Crow Treating Provider/Extender: Linwood Dibbles, Cylan Borum Weeks in Treatment: 2 Verbal / Phone Orders: No Diagnosis Coding ICD-10 Coding Code Description [phone] Pressure ulcer of left heel, stage 3 L89.95 Pressure ulcer of unspecified site, unstageable I87.2 Venous insufficiency (chronic) (peripheral) N18.3 Chronic kidney disease, stage 3 (moderate) L08.9 Local infection of the skin and subcutaneous tissue, unspecified Wound Cleansing Wound #1 Right,Posterior Lower Leg o Clean wound with Normal Saline. o May Shower, gently pat wound dry prior to applying new dressing. Wound #2 Left Calcaneus o Clean wound with Normal Saline. o May Shower, gently pat wound dry prior to applying new dressing. Anesthetic (add to Medication List) Wound #1 Right,Posterior Lower Leg o Topical Lidocaine 4% cream applied to wound bed prior to debridement (In Clinic Only). Wound #2 Left Calcaneus o Topical Lidocaine 4% cream applied to wound bed prior to debridement (In Clinic Only). Primary Wound Dressing Wound #1 Right,Posterior Lower Leg o Santyl Ointment Wound #2 Left Calcaneus o Santyl Ointment Secondary Dressing Wound #1 Right,Posterior Lower Leg o Dry Gauze o Boardered Foam Dressing Wound #2 Left Calcaneus o Dry Gauze o Boardered Foam Dressing Dressing Change Frequency Wound #1 Right,Posterior Lower Leg Citro, Lennyx H. ([id number]) o Change dressing every day. Wound #2 Left Calcaneus o Change dressing every day. Follow-up Appointments Wound #1 Right,Posterior Lower Leg o Return Appointment in 2 weeks. Wound #2 Left Calcaneus o Return Appointment in 2  weeks. Edema Control Wound #1 Right,Posterior Lower Leg o Elevate legs to the level of the heart and pump ankles as often as possible Wound #2 Left Calcaneus o Elevate legs to the level of the heart and pump ankles as often as possible Off-Loading Wound #1 Right,Posterior Lower Leg o Other: - PLEASE PROVIDE PATIENT WITH BILATERAL PREVALON BOOTS FOR PATIENT TO WEAR AT ALL TIMES WHILE IN BED AND IN WHEELCHAIR Wound #2 Left Calcaneus o Other: - PLEASE PROVIDE PATIENT WITH BILATERAL PREVALON BOOTS FOR PATIENT TO WEAR AT ALL TIMES WHILE IN BED AND IN [facility] Additional Orders / Instructions Wound #1 Right,Posterior Lower Leg o Increase protein intake. o Activity as tolerated o Other: - Please add facility wound healing vitamins per facility protocol Wound #2 Left Calcaneus o Increase protein intake.   o Activity as tolerated o Other: - Please add facility wound healing vitamins per facility protocol Electronic Signature(s) Signed: 05/18/2018 5:42:13 PM By: Curtis Sites Signed: 05/19/2018 12:21:38 AM By: Lenda Kelp PA-C Entered By: Curtis Sites on 05/18/2018 10:09:04 [name], [name] ([id number]) -------------------------------------------------------------------------------- Problem List Details Patient Name: [name], [name] Date of Service: 05/18/2018 9:30 AM Medical Record Number: [id number] Patient Account Number: [id number] Date of Birth/Sex: 29-Jul-1926 (84 y.o. F) Treating RN: Phillis Haggis Primary Care Provider: SYSTEM, PCP Other Clinician: Referring Provider: Einar Crow Treating Provider/Extender: Linwood Dibbles, Leandrew Keech Weeks in Treatment: 2 Active Problems ICD-10 Evaluated Encounter Code Description Active Date Today Diagnosis L89.623 Pressure ulcer of left heel, stage 3 04/30/2018 No Yes L89.95 Pressure ulcer of unspecified site, unstageable 04/30/2018 No Yes I87.2 Venous insufficiency (chronic) (peripheral) 04/30/2018 No Yes N18.3  Chronic kidney disease, stage 3 (moderate) 04/30/2018 No Yes L08.9 Local infection of the skin and subcutaneous tissue, 04/30/2018 No Yes unspecified Inactive Problems Resolved Problems Electronic Signature(s) Signed: 05/19/2018 12:21:38 AM By: Lenda Kelp PA-C Entered By: Lenda Kelp on 05/18/2018 08:65:78 [name], [name] ([id number]) -------------------------------------------------------------------------------- Progress Note Details Patient Name: [name]. Date of Service: 05/18/2018 9:30 AM Medical Record Number: [id number] Patient Account Number: [id number] Date of Birth/Sex: 1926/10/08 (84 y.o. F) Treating RN: Phillis Haggis Primary Care Provider: SYSTEM, PCP Other Clinician: Referring Provider: Einar Crow Treating Provider/Extender: Linwood Dibbles, Sloan Galentine Weeks in Treatment: 2 Subjective Chief Complaint Information obtained from Patient She is here for multiple wounds History of Present Illness (HPI) 04/30/18- She is here for initial evaluation of right posterior calf and left heel pressure ulcers. She has a documented history of dementia but presents today alert and oriented o3, confused with time frame/dates pertaining to recent injury, hospitalization and placementotherwise she appears appropriate to make decisions at today's visit. On 01/13/18 she was admitted to [facility] after a fall, she sustained a closed fracture to the proximal tib-fib on the right side. She was treated nonsurgically with a full-length leg immobilizer/brace and NWB status. She began to complain of pain to the posterior aspect of the right leg, on 5/21 she was noted to have a pressure injury, the patient states that she was exchanged to a shorter knee immobilizer on Tuesday 6/4. This is currently being treated with Santyl at the nursing home, she was transitioned from rehabilitation to long-term care at [facility] [place]. She also presents with a left heel  pressure injury of unknown chronicity; there is no record of it in nursing home documentation. She had a blister noted to the left great toe, according to nursing home documentation, on 3/7. This now appears as a reabsorbing blood blister/deep tissue injury and is not currently open. She denies wearing any heel open offloading boots for heel pressure relief. She has been advised to keep pressure off of left heel and right posterior leg at all times; offloading heel boots have been ordered for facility to provide. She is currently on Bactrim for culture (5/31) with trimeth/sulfa sensitive staph species (coag neg). She does admit to loose stools, 1-3 per day; will monitor next week for improvement and consider testing for c-diff. She is a DNR/Pallitive care per facility records. She does have reduced ABI in office today and we discussed the potential for delayed healing given this result, the risk for deterioration given the level and possible need for future testing/intervention. It is her decision that we treat conservatively at this time, not sending her for additional tests. 05/07/18  on evaluation today patient appears to be doing somewhat better I feel in regard to her ulcers on the left heel and the right lateral leg. She's been tolerating the dressing changes without complication with that being said she continues to have some discomfort she is also still recovering from her tib/fib fracture on the right. She has additional swelling of the right lower extremity secondary to this. No fevers, chills, nausea, or vomiting noted at this time. 05/18/18 on evaluation today patient's wound on the left heel actually appears to be doing better. Unfortunately the wound on the right lateral lower extremity actually appears to be about the same maybe even a little bit more bruising noted today. Unfortunately. With that being said I think the main issue is that she's getting pressure to the site when she  actually is sitting in her wheelchair which she does for most of the day according what she tells me today. I do think that a Prevalon Boots may help to offload this to a degree and this is something that I did recommend for her today. We have previously it looks like order bilateral Prevalon Boots but nonetheless she has one for the left not for the right she tells me. We were going to see if we can work on that. Patient History Information obtained from Patient. Family History Diabetes - Father, Heart Disease - Father, Kidney Disease - Mother, No family history of Cancer, Hypertension, Lung Disease, Seizures, Stroke, Thyroid Problems, Tuberculosis. Social History Former smoker - 20 years stopped, Marital Status - Widowed, Alcohol Use - Rarely, Drug Use - No History, Caffeine Use - Maduro, Haasini H. ([id number]) Daily. Review of Systems (ROS) Constitutional Symptoms (General Health) Denies complaints or symptoms of Fever, Chills. Respiratory The patient has no complaints or symptoms. Cardiovascular The patient has no complaints or symptoms. Psychiatric The patient has no complaints or symptoms. Objective Constitutional Well-nourished and well-hydrated in no acute distress. Vitals Time Taken: 9:50 AM, Height: 62 in, Temperature: 97.5 F, Pulse: 83 bpm, Respiratory Rate: 18 breaths/min, Blood Pressure: 173/64 mmHg. Respiratory normal breathing without difficulty. clear to auscultation bilaterally. Cardiovascular regular rate and rhythm with normal S1, S2. Psychiatric this patient is able to make decisions and demonstrates good insight into disease process. Alert and Oriented x 3. pleasant and cooperative. General Notes: At this point patient's wound bed on the left heel seems to be showing signs of improvement this is good news. There still some necrotic tissue noted in the surface of the wound. In regard to the right lateral lower extremity she definitely has necrotic tissue  noted along with some new contusion that I think is due to pressure. I really want her to have a Prevalon Boots to try to help with offloading although there may need to be other offloading measures put in place as well such as transferring to the bed laying on her side and doing other things to help alleviate this excess pressure. I did discuss this with the patient and she understands. Integumentary (Hair, Skin) Wound #1 status is Open. Original cause of wound was Pressure Injury. The wound is located on the Right,Posterior Lower Leg. The wound measures 3.8cm length x 1cm width x 0.2cm depth; 2.985cm^2 area and 0.597cm^3 volume. There is Fat Layer (Subcutaneous Tissue) Exposed exposed. There is no tunneling or undermining noted. There is a large amount of serosanguineous drainage noted. The wound margin is distinct with the outline attached to the wound base. There is small (1-33%) red, pink granulation within  the wound bed. There is a large (67-100%) amount of necrotic tissue within the wound bed including Eschar and Adherent Slough. The periwound skin appearance did not exhibit: Callus, Crepitus, Excoriation, Induration, Rash, Scarring, Dry/Scaly, Maceration, Atrophie Blanche, Cyanosis, Ecchymosis, Hemosiderin Staining, Mottled, Pallor, Rubor, Erythema. Periwound temperature was noted as No Abnormality. The periwound has tenderness on palpation. Wound #2 status is Open. Original cause of wound was Pressure Injury. The wound is located on the Left Calcaneus. The wound measures 0.6cm length x 0.5cm width x 0.2cm depth; 0.236cm^2 area and 0.047cm^3 volume. There is Fat Layer Buczynski, Maika H. ([id number]) (Subcutaneous Tissue) Exposed exposed. There is no tunneling or undermining noted. There is a small amount of serosanguineous drainage noted. The wound margin is distinct with the outline attached to the wound base. There is no granulation within the wound bed. There is a large (67-100%) amount  of necrotic tissue within the wound bed including Eschar and Adherent Slough. The periwound skin appearance exhibited: Callus, Induration. The periwound skin appearance did not exhibit: Crepitus, Excoriation, Rash, Scarring, Dry/Scaly, Maceration, Atrophie Blanche, Cyanosis, Ecchymosis, Hemosiderin Staining, Mottled, Pallor, Rubor, Erythema. Periwound temperature was noted as No Abnormality. The periwound has tenderness on palpation. Assessment Active Problems ICD-10 Pressure ulcer of left heel, stage 3 Pressure ulcer of unspecified site, unstageable Venous insufficiency (chronic) (peripheral) Chronic kidney disease, stage 3 (moderate) Local infection of the skin and subcutaneous tissue, unspecified Plan Wound Cleansing: Wound #1 Right,Posterior Lower Leg: Clean wound with Normal Saline. May Shower, gently pat wound dry prior to applying new dressing. Wound #2 Left Calcaneus: Clean wound with Normal Saline. May Shower, gently pat wound dry prior to applying new dressing. Anesthetic (add to Medication List): Wound #1 Right,Posterior Lower Leg: Topical Lidocaine 4% cream applied to wound bed prior to debridement (In Clinic Only). Wound #2 Left Calcaneus: Topical Lidocaine 4% cream applied to wound bed prior to debridement (In Clinic Only). Primary Wound Dressing: Wound #1 Right,Posterior Lower Leg: Santyl Ointment Wound #2 Left Calcaneus: Santyl Ointment Secondary Dressing: Wound #1 Right,Posterior Lower Leg: Dry Gauze Boardered Foam Dressing Wound #2 Left Calcaneus: Dry Gauze Boardered Foam Dressing Dressing Change Frequency: Wound #1 Right,Posterior Lower Leg: Change dressing every day. Wound #2 Left Calcaneus: Change dressing every day. [name], [name] ([id number]) Follow-up Appointments: Wound #1 Right,Posterior Lower Leg: Return Appointment in 2 weeks. Wound #2 Left Calcaneus: Return Appointment in 2 weeks. Edema Control: Wound #1 Right,Posterior Lower  Leg: Elevate legs to the level of the heart and pump ankles as often as possible Wound #2 Left Calcaneus: Elevate legs to the level of the heart and pump ankles as often as possible Off-Loading: Wound #1 Right,Posterior Lower Leg: Other: - PLEASE PROVIDE PATIENT WITH BILATERAL PREVALON BOOTS FOR PATIENT TO WEAR AT ALL TIMES WHILE IN BED AND IN WHEELCHAIR Wound #2 Left Calcaneus: Other: - PLEASE PROVIDE PATIENT WITH BILATERAL PREVALON BOOTS FOR PATIENT TO WEAR AT ALL TIMES WHILE IN BED AND IN [facility] Additional Orders / Instructions: Wound #1 Right,Posterior Lower Leg: Increase protein intake. Activity as tolerated Other: - Please add facility wound healing vitamins per facility protocol Wound #2 Left Calcaneus: Increase protein intake. Activity as tolerated Other: - Please add facility wound healing vitamins per facility protocol At this point I'm gonna suggest we continue with the above wound care orders the main thing that I'm recommending is the addition of a Prevalon Boots for the right lower extremity to help with offloading the right lateral leg. By lifting the heel will also take  pressure off of this area. Hopefully they can get this in place fairly quickly for her as I think it's of utmost importance. She may also need additional offloading techniques to avoid additional injury to the right lower extremity. All of this was discussed with patient today. Please see above for specific wound care orders. We will see patient for re-evaluation in 1 week(s) here in the clinic. If anything worsens or changes patient will contact our office for additional recommendations. Electronic Signature(s) Signed: 05/19/2018 12:21:38 AM By: Lenda Kelp PA-C Entered By: Lenda Kelp on 05/18/2018 10:14:28 [name], [name] ([id number]) -------------------------------------------------------------------------------- ROS/PFSH Details Patient Name: [name]. Date of Service:  05/18/2018 9:30 AM Medical Record Number: [id number] Patient Account Number: [id number] Date of Birth/Sex: 12/15/1925 (84 y.o. F) Treating RN: Phillis Haggis Primary Care Provider: SYSTEM, PCP Other Clinician: Referring Provider: Einar Crow Treating Provider/Extender: Linwood Dibbles, Emmalyne Giacomo Weeks in Treatment: 2 Information Obtained From Patient Wound History Do you currently have one or more open woundso Yes How many open wounds do you currently haveo 2 Approximately how long have you had your woundso 2 weeks How have you been treating your wound(s) until nowo 2 weeks Has your wound(s) ever healed and then re-openedo No Have you had any lab work done in the past montho No Have you tested positive for an antibiotic resistant organism (MRSA, VRE)o No Have you tested positive for osteomyelitis (bone infection)o No Have you had any tests for circulation on your legso No Constitutional Symptoms (General Health) Complaints and Symptoms: Negative for: Fever; Chills Eyes Medical History: Positive for: Cataracts - one eye Negative for: Glaucoma; Optic Neuritis Ear/Nose/Mouth/Throat Medical History: Negative for: Chronic sinus problems/congestion; Middle ear problems Hematologic/Lymphatic Medical History: Positive for: Lymphedema Negative for: Anemia; Hemophilia; Human Immunodeficiency Virus; Sickle Cell Disease Respiratory Complaints and Symptoms: No Complaints or Symptoms Medical History: Negative for: Aspiration; Asthma; Chronic Obstructive Pulmonary Disease (COPD); Pneumothorax; Sleep Apnea; Tuberculosis Cardiovascular Complaints and Symptoms: No Complaints or Symptoms Medical HistoryDEDEE, LISS ([id number]) Positive for: Hypertension Negative for: Angina; Arrhythmia; Coronary Artery Disease; Deep Vein Thrombosis; Hypotension; Myocardial Infarction; Peripheral Arterial Disease; Peripheral Venous Disease; Phlebitis; Vasculitis Gastrointestinal Medical  History: Negative for: Cirrhosis ; Colitis; Crohnos; Hepatitis A; Hepatitis B; Hepatitis C Endocrine Medical History: Negative for: Type I Diabetes; Type II Diabetes Genitourinary Medical History: Negative for: End Stage Renal Disease Immunological Medical History: Negative for: Lupus Erythematosus; Raynaudos; Scleroderma Integumentary (Skin) Medical History: Negative for: History of Burn; History of pressure wounds Musculoskeletal Medical History: Positive for: Rheumatoid Arthritis Negative for: Gout; Osteoarthritis; Osteomyelitis Neurologic Medical History: Negative for: Dementia; Neuropathy; Quadriplegia; Paraplegia; Seizure Disorder Psychiatric Complaints and Symptoms: No Complaints or Symptoms Medical History: Negative for: Anorexia/bulimia; Confinement Anxiety HBO Extended History Items Eyes: Cataracts Immunizations Pneumococcal Vaccine: Received Pneumococcal Vaccination: Yes Implantable Devices Family and Social History MASSA, PE ([id number]) Cancer: No; Diabetes: Yes - Father; Heart Disease: Yes - Father; Hypertension: No; Kidney Disease: Yes - Mother; Lung Disease: No; Seizures: No; Stroke: No; Thyroid Problems: No; Tuberculosis: No; Former smoker - 20 years stopped; Marital Status - Widowed; Alcohol Use: Rarely; Drug Use: No History; Caffeine Use: Daily; Financial Concerns: No; Food, Clothing or Shelter Needs: No; Support System Lacking: No; Transportation Concerns: No; Advanced Directives: Yes (Not Provided); Patient does not want information on Advanced Directives; Do not resuscitate: No; Living Will: Yes (Not Provided); Medical Power of Attorney: No Physician Affirmation I have reviewed and agree with the above information. Electronic Signature(s) Signed: 05/19/2018 12:21:38 AM By:  Lenda Kelp PA-C Signed: 05/19/2018 4:02:05 PM By: Alejandro Mulling Entered By: Lenda Kelp on 05/18/2018 10:13:19 Prindle, [name]  ([id number]) -------------------------------------------------------------------------------- SuperBill Details Patient Name: [name]. Date of Service: 05/18/2018 Medical Record Number: [id number] Patient Account Number: [id number] Date of Birth/Sex: 11-10-1926 (84 y.o. F) Treating RN: Phillis Haggis Primary Care Provider: SYSTEM, PCP Other Clinician: Referring Provider: Einar Crow Treating Provider/Extender: Linwood Dibbles, Derhonda Eastlick Weeks in Treatment: 2 Diagnosis Coding ICD-10 Codes Code Description [phone] Pressure ulcer of left heel, stage 3 L89.95 Pressure ulcer of unspecified site, unstageable I87.2 Venous insufficiency (chronic) (peripheral) N18.3 Chronic kidney disease, stage 3 (moderate) L08.9 Local infection of the skin and subcutaneous tissue, unspecified Facility Procedures CPT4 Code: [id number] Description: 00867 - DEBRIDE W/O ANES NON SELECT Modifier: Quantity: 1 Physician Procedures CPT4 Code: 6195093 Description: 99213 - WC PHYS LEVEL 3 - EST PT ICD-10 Diagnosis Description L89.623 Pressure ulcer of left heel, stage 3 L89.95 Pressure ulcer of unspecified site, unstageable I87.2 Venous insufficiency (chronic) (peripheral) N18.3 Chronic kidney disease,  stage 3 (moderate) Modifier: Quantity: 1 Electronic Signature(s) Signed: 05/18/2018 5:42:13 PM By: Curtis Sites Signed: 05/19/2018 12:21:38 AM By: Lenda Kelp PA-C Entered By: Curtis Sites on 05/18/2018 11:13:16

## 2018-05-20 NOTE — Progress Notes (Signed)
JEZREEL, SISK ([id number]) Visit Report for 05/18/2018 Arrival Information Details Patient Name: [name], [name]. Date of Service: 05/18/2018 9:30 AM Medical Record Number: [id number] Patient Account Number: [id number] Date of Birth/Sex: 10/25/1926 (84 y.o. F) Treating RN: Renne Crigler Primary Care Burlene Montecalvo: SYSTEM, PCP Other Clinician: Referring Dala Breault: Einar Crow Treating Bernell Haynie/Extender: Linwood Dibbles, HOYT Weeks in Treatment: 2 Visit Information History Since Last Visit All ordered tests and consults were completed: No Patient Arrived: Wheel Chair Added or deleted any medications: No Arrival Time: 09:41 Any new allergies or adverse reactions: No Accompanied By: self Had a fall or experienced change in No Transfer Assistance: None activities of daily living that may affect Patient Identification Verified: Yes risk of falls: Secondary Verification Process Completed: Yes Signs or symptoms of abuse/neglect since last visito No Hospitalized since last visit: No Implantable device outside of the clinic excluding No cellular tissue based products placed in the center since last visit: Pain Present Now: Yes Electronic Signature(s) Signed: 05/18/2018 4:47:28 PM By: Renne Crigler Entered By: Renne Crigler on 05/18/2018 09:49:23 [name] ([id number]) -------------------------------------------------------------------------------- Encounter Discharge Information Details Patient Name: [name]. Date of Service: 05/18/2018 9:30 AM Medical Record Number: [id number] Patient Account Number: [id number] Date of Birth/Sex: [birth year] (84 y.o. F) Treating RN: Renne Crigler Primary Care Issai Werling: SYSTEM, PCP Other Clinician: Referring Tonjua Rossetti: Einar Crow Treating Emory Gallentine/Extender: Linwood Dibbles, HOYT Weeks in Treatment: 2 Encounter Discharge Information Items Discharge Condition: Stable Ambulatory Status: Wheelchair Discharge  Destination: Home Transportation: Private Auto Schedule Follow-up Appointment: Yes Clinical Summary of Care: Electronic Signature(s) Signed: 05/18/2018 4:47:28 PM By: Renne Crigler Entered By: Renne Crigler on 05/18/2018 10:22:36 [name] ([id number]) -------------------------------------------------------------------------------- Lower Extremity Assessment Details Patient Name: [name], [name] Date of Service: 05/18/2018 9:30 AM Medical Record Number: [id number] Patient Account Number: [id number] Date of Birth/Sex: 05-19-1926 (84 y.o. F) Treating RN: Renne Crigler Primary Care Deaysia Grigoryan: SYSTEM, PCP Other Clinician: Referring Keashia Haskins: Einar Crow Treating Sesilia Poucher/Extender: STONE III, HOYT Weeks in Treatment: 2 Edema Assessment Assessed: [Left: No] [Right: No] Edema: [Left: Yes] [Right: Yes] Calf Left: Right: Point of Measurement: 26 cm From Medial Instep 24.7 cm 27 cm Ankle Left: Right: Point of Measurement: 10 cm From Medial Instep 19.5 cm 22.5 cm Vascular Assessment Claudication: Claudication Assessment [Left:None] [Right:None] Pulses: Dorsalis Pedis Palpable: [Left:Yes] [Right:Yes] Posterior Tibial Extremity colors, hair growth, and conditions: Extremity Color: [Left:Normal] [Right:Normal] Hair Growth on Extremity: [Left:No] [Right:No] Temperature of Extremity: [Left:Warm] [Right:Warm] Capillary Refill: [Left:< 3 seconds] [Right:< 3 seconds] Toe Nail Assessment Left: Right: Thick: Yes Yes Discolored: Yes Yes Deformed: Yes Yes Improper Length and Hygiene: Yes Yes Electronic Signature(s) Signed: 05/18/2018 4:47:28 PM By: Renne Crigler Entered By: Renne Crigler on 05/18/2018 09:53:20 [name] ([id number]) -------------------------------------------------------------------------------- Multi Wound Chart Details Patient Name: [name]. Date of Service: 05/18/2018 9:30 AM Medical Record Number:  [id number] Patient Account Number: [id number] Date of Birth/Sex: 02-23-1926 (84 y.o. F) Treating RN: Curtis Sites Primary Care Jabaree Mercado: SYSTEM, PCP Other Clinician: Referring Grissel Tyrell: Einar Crow Treating Ariona Deschene/Extender: STONE III, HOYT Weeks in Treatment: 2 Vital Signs Height(in): 62 Pulse(bpm): 83 Weight(lbs): Blood Pressure(mmHg): 173/64 Body Mass Index(BMI): Temperature(F): 97.5 Respiratory Rate 18 (breaths/min): Photos: [1:No Photos] [2:No Photos] [N/A:N/A] Wound Location: [1:Right Lower Leg - Posterior] [2:Left Calcaneus] [N/A:N/A] Wounding Event: [1:Pressure Injury] [2:Pressure Injury] [N/A:N/A] Primary Etiology: [1:Pressure Ulcer] [2:Pressure Ulcer] [N/A:N/A] Comorbid History: [1:Cataracts, Lymphedema, Hypertension, Rheumatoid Arthritis] [2:Cataracts, Lymphedema, Hypertension, Rheumatoid Arthritis] [N/A:N/A] Date Acquired: [1:02/08/2018] [2:04/10/2018] [N/A:N/A] Weeks of Treatment: [1:2] [2:2] [N/A:N/A] Wound Status: [1:Open] [2:Open] [N/A:N/A]  Measurements L x W x D [1:3.8x1x0.2] [2:0.6x0.5x0.2] [N/A:N/A] (cm) Area (cm) : [1:2.985] [2:0.236] [N/A:N/A] Volume (cm) : [1:0.597] [2:0.047] [N/A:N/A] % Reduction in Area: [1:-8.60%] [2:-50.30%] [N/A:N/A] % Reduction in Volume: [1:-8.50%] [2:-51.60%] [N/A:N/A] Classification: [1:Category/Stage III] [2:Category/Stage III] [N/A:N/A] Exudate Amount: [1:Large] [2:Small] [N/A:N/A] Exudate Type: [1:Serosanguineous] [2:Serosanguineous] [N/A:N/A] Exudate Color: [1:red, brown] [2:red, brown] [N/A:N/A] Wound Margin: [1:Distinct, outline attached] [2:Distinct, outline attached] [N/A:N/A] Granulation Amount: [1:Small (1-33%)] [2:None Present (0%)] [N/A:N/A] Granulation Quality: [1:Red, Pink] [2:N/A] [N/A:N/A] Necrotic Amount: [1:Large (67-100%)] [2:Large (67-100%)] [N/A:N/A] Necrotic Tissue: [1:Eschar, Adherent Slough] [2:Eschar, Adherent Slough] [N/A:N/A] Exposed Structures: [1:Fat Layer (Subcutaneous Tissue) Exposed:  Yes Fascia: No Tendon: No Muscle: No Joint: No Bone: No] [2:Fat Layer (Subcutaneous Tissue) Exposed: Yes Fascia: No Tendon: No Muscle: No Joint: No Bone: No] [N/A:N/A] Epithelialization: [1:None] [2:None] [N/A:N/A] Periwound Skin Texture: [1:Excoriation: No Induration: No Callus: No Crepitus: No] [2:Induration: Yes Callus: Yes Excoriation: No Crepitus: No] [N/A:N/A] Rash: No Rash: No Scarring: No Scarring: No Periwound Skin Moisture: Maceration: No Maceration: No N/A Dry/Scaly: No Dry/Scaly: No Periwound Skin Color: Atrophie Blanche: No Atrophie Blanche: No N/A Cyanosis: No Cyanosis: No Ecchymosis: No Ecchymosis: No Erythema: No Erythema: No Hemosiderin Staining: No Hemosiderin Staining: No Mottled: No Mottled: No Pallor: No Pallor: No Rubor: No Rubor: No Temperature: No Abnormality No Abnormality N/A Tenderness on Palpation: Yes Yes N/A Wound Preparation: Ulcer Cleansing: Ulcer Cleansing: N/A Rinsed/Irrigated with Saline Rinsed/Irrigated with Saline Topical Anesthetic Applied: Topical Anesthetic Applied: Other: lidocaine 4% Other: lidocaine 4% Treatment Notes Electronic Signature(s) Signed: 05/18/2018 5:42:13 PM By: Curtis Sites Entered By: Curtis Sites on 05/18/2018 10:07:13 [name], [name] ([id number]) -------------------------------------------------------------------------------- Multi-Disciplinary Care Plan Details Patient Name: [name]. Date of Service: 05/18/2018 9:30 AM Medical Record Number: [id number] Patient Account Number: [id number] Date of Birth/Sex: Sep 28, 1926 (84 y.o. F) Treating RN: Curtis Sites Primary Care Michelle Wnek: SYSTEM, PCP Other Clinician: Referring Lequita Meadowcroft: Einar Crow Treating Daion Ginsberg/Extender: Linwood Dibbles, HOYT Weeks in Treatment: 2 Active Inactive ` Abuse / Safety / Falls / Self Care Management Nursing Diagnoses: Impaired physical mobility Goals: Patient will not experience any injury related to  falls Date Initiated: 04/30/2018 Target Resolution Date: 05/29/2018 Goal Status: Active Interventions: Assess fall risk on admission and as needed Notes: ` Nutrition Nursing Diagnoses: Potential for alteratiion in Nutrition/Potential for imbalanced nutrition Goals: Patient/caregiver agrees to and verbalizes understanding of need to use nutritional supplements and/or vitamins as prescribed Date Initiated: 04/30/2018 Target Resolution Date: 07/16/2018 Goal Status: Active Interventions: Assess patient nutrition upon admission and as needed per policy Notes: ` Orientation to the Wound Care Program Nursing Diagnoses: Knowledge deficit related to the wound healing center program Goals: Patient/caregiver will verbalize understanding of the Wound Healing Center Program Date Initiated: 04/30/2018 Target Resolution Date: 07/30/2018 Goal Status: Active Interventions: AGATA, LUCENTE ([id number]) Provide education on orientation to the wound center Notes: ` Wound/Skin Impairment Nursing Diagnoses: Impaired tissue integrity Goals: Ulcer/skin breakdown will heal within 14 weeks Date Initiated: 04/30/2018 Target Resolution Date: 07/30/2018 Goal Status: Active Interventions: Assess patient/caregiver ability to obtain necessary supplies Assess patient/caregiver ability to perform ulcer/skin care regimen upon admission and as needed Assess ulceration(s) every visit Notes: Electronic Signature(s) Signed: 05/18/2018 5:42:13 PM By: Curtis Sites Entered By: Curtis Sites on 05/18/2018 10:07:00 [name] ([id number]) -------------------------------------------------------------------------------- Pain Assessment Details Patient Name: [name]. Date of Service: 05/18/2018 9:30 AM Medical Record Number: [id number] Patient Account Number: [id number] Date of Birth/Sex: [birth year] (84 y.o. F) Treating RN: Renne Crigler Primary Care Kaymen Adrian: SYSTEM, PCP Other  Clinician: Referring Zebulon Gantt:  Einar Crow Treating Ayza Ripoll/Extender: Linwood Dibbles, HOYT Weeks in Treatment: 2 Active Problems Location of Pain Severity and Description of Pain Patient Has Paino Yes Site Locations With Dressing Change: Yes Duration of the Pain. Constant / Intermittento Intermittent Rate the pain. Current Pain Level: 3 Character of Pain Describe the Pain: Burning, Other: stinging Pain Management and Medication Current Pain Management: Electronic Signature(s) Signed: 05/18/2018 4:47:28 PM By: Renne Crigler Entered By: Renne Crigler on 05/18/2018 09:49:58 Herrle, [name] ([id number]) -------------------------------------------------------------------------------- Patient/Caregiver Education Details Patient Name: [name]. Date of Service: 05/18/2018 9:30 AM Medical Record Number: [id number] Patient Account Number: [id number] Date of Birth/Gender: Nov 21, 1926 (84 y.o. F) Treating RN: Renne Crigler Primary Care Physician: SYSTEM, PCP Other Clinician: Referring Physician: Einar Crow Treating Physician/Extender: Linwood Dibbles, HOYT Weeks in Treatment: 2 Education Assessment Education Provided To: Patient Education Topics Provided Wound Debridement: Handouts: Wound Debridement Methods: Explain/Verbal Responses: State content correctly Wound/Skin Impairment: Handouts: Caring for Your Ulcer Methods: Explain/Verbal Responses: State content correctly Electronic Signature(s) Signed: 05/18/2018 4:47:28 PM By: Renne Crigler Entered By: Renne Crigler on 05/18/2018 10:22:51 [name], [name] ([id number]) -------------------------------------------------------------------------------- Wound Assessment Details Patient Name: [name], [name] Date of Service: 05/18/2018 9:30 AM Medical Record Number: [id number] Patient Account Number: [id number] Date of Birth/Sex: 1926-05-31 (84 y.o. F) Treating RN: Renne Crigler Primary Care  Emelyn Roen: SYSTEM, PCP Other Clinician: Referring Neri Vieyra: Einar Crow Treating Marquail Bradwell/Extender: STONE III, HOYT Weeks in Treatment: 2 Wound Status Wound Number: 1 Primary Pressure Ulcer Etiology: Wound Location: Right Lower Leg - Posterior Wound Status: Open Wounding Event: Pressure Injury Comorbid Cataracts, Lymphedema, Hypertension, Date Acquired: 02/08/2018 History: Rheumatoid Arthritis Weeks Of Treatment: 2 Clustered Wound: No Photos Photo Uploaded By: Curtis Sites on 05/18/2018 17:43:39 Wound Measurements Length: (cm) 3.8 Width: (cm) 1 Depth: (cm) 0.2 Area: (cm) 2.985 Volume: (cm) 0.597 % Reduction in Area: -8.6% % Reduction in Volume: -8.5% Epithelialization: None Tunneling: No Undermining: No Wound Description Classification: Category/Stage III Wound Margin: Distinct, outline attached Exudate Amount: Large Exudate Type: Serosanguineous Exudate Color: red, brown Foul Odor After Cleansing: No Slough/Fibrino Yes Wound Bed Granulation Amount: Small (1-33%) Exposed Structure Granulation Quality: Red, Pink Fascia Exposed: No Necrotic Amount: Large (67-100%) Fat Layer (Subcutaneous Tissue) Exposed: Yes Necrotic Quality: Eschar, Adherent Slough Tendon Exposed: No Muscle Exposed: No Joint Exposed: No Bone Exposed: No Periwound Skin Texture Melby, Janace H. ([id number]) Texture Color No Abnormalities Noted: No No Abnormalities Noted: No Callus: No Atrophie Blanche: No Crepitus: No Cyanosis: No Excoriation: No Ecchymosis: No Induration: No Erythema: No Rash: No Hemosiderin Staining: No Scarring: No Mottled: No Pallor: No Moisture Rubor: No No Abnormalities Noted: No Dry / Scaly: No Temperature / Pain Maceration: No Temperature: No Abnormality Tenderness on Palpation: Yes Wound Preparation Ulcer Cleansing: Rinsed/Irrigated with Saline Topical Anesthetic Applied: Other: lidocaine 4%, Treatment Notes Wound #1 (Right, Posterior  Lower Leg) 1. Cleansed with: Clean wound with Normal Saline 2. Anesthetic Topical Lidocaine 4% cream to wound bed prior to debridement 4. Dressing Applied: Santyl Ointment 5. Secondary Dressing Applied Bordered Foam Dressing Dry Gauze Electronic Signature(s) Signed: 05/18/2018 4:47:28 PM By: Renne Crigler Entered By: Renne Crigler on 05/18/2018 09:53:51 [name], [name] ([id number]) -------------------------------------------------------------------------------- Wound Assessment Details Patient Name: [name], [name] Date of Service: 05/18/2018 9:30 AM Medical Record Number: [id number] Patient Account Number: [id number] Date of Birth/Sex: 11-23-1926 (84 y.o. F) Treating RN: Renne Crigler Primary Care Oluwatoyin Banales: SYSTEM, PCP Other Clinician: Referring Nuh Lipton: Einar Crow Treating [name]: STONE III, HOYT Weeks in Treatment: 2 Wound Status Wound Number: 2 Primary Pressure  Ulcer Etiology: Wound Location: Left Calcaneus Wound Status: Open Wounding Event: Pressure Injury Comorbid Cataracts, Lymphedema, Hypertension, Date Acquired: 04/10/2018 History: Rheumatoid Arthritis Weeks Of Treatment: 2 Clustered Wound: No Photos Photo Uploaded By: Curtis Sites on 05/18/2018 17:43:39 Wound Measurements Length: (cm) 0.6 Width: (cm) 0.5 Depth: (cm) 0.2 Area: (cm) 0.236 Volume: (cm) 0.047 % Reduction in Area: -50.3% % Reduction in Volume: -51.6% Epithelialization: None Tunneling: No Undermining: No Wound Description Classification: Category/Stage III Wound Margin: Distinct, outline attached Exudate Amount: Small Exudate Type: Serosanguineous Exudate Color: red, brown Foul Odor After Cleansing: No Slough/Fibrino Yes Wound Bed Granulation Amount: None Present (0%) Exposed Structure Necrotic Amount: Large (67-100%) Fascia Exposed: No Necrotic Quality: Eschar, Adherent Slough Fat Layer (Subcutaneous Tissue) Exposed: Yes Tendon Exposed:  No Muscle Exposed: No Joint Exposed: No Bone Exposed: No Periwound Skin Texture Elsbury, Vaniya H. ([id number]) Texture Color No Abnormalities Noted: No No Abnormalities Noted: No Callus: Yes Atrophie Blanche: No Crepitus: No Cyanosis: No Excoriation: No Ecchymosis: No Induration: Yes Erythema: No Rash: No Hemosiderin Staining: No Scarring: No Mottled: No Pallor: No Moisture Rubor: No No Abnormalities Noted: No Dry / Scaly: No Temperature / Pain Maceration: No Temperature: No Abnormality Tenderness on Palpation: Yes Wound Preparation Ulcer Cleansing: Rinsed/Irrigated with Saline Topical Anesthetic Applied: Other: lidocaine 4%, Treatment Notes Wound #2 (Left Calcaneus) 1. Cleansed with: Clean wound with Normal Saline 2. Anesthetic Topical Lidocaine 4% cream to wound bed prior to debridement 4. Dressing Applied: Santyl Ointment 5. Secondary Dressing Applied Bordered Foam Dressing Dry Gauze Electronic Signature(s) Signed: 05/18/2018 4:47:28 PM By: Renne Crigler Entered By: Renne Crigler on 05/18/2018 09:55:04 Maberry, Raneem [name] ([id number]) -------------------------------------------------------------------------------- Vitals Details Patient Name: [name]. Date of Service: 05/18/2018 9:30 AM Medical Record Number: [id number] Patient Account Number: [id number] Date of Birth/Sex: 06-Jan-1926 (84 y.o. F) Treating RN: Renne Crigler Primary Care Avis Tirone: SYSTEM, PCP Other Clinician: Referring Mellody Masri: Einar Crow Treating Tyrese Capriotti/Extender: STONE III, HOYT Weeks in Treatment: 2 Vital Signs Time Taken: 09:50 Temperature (F): 97.5 Height (in): 62 Pulse (bpm): 83 Respiratory Rate (breaths/min): 18 Blood Pressure (mmHg): 173/64 Reference Range: 80 - 120 mg / dl Electronic Signature(s) Signed: 05/18/2018 4:47:28 PM By: Renne Crigler Entered By: Renne Crigler on 05/18/2018 09:50:21

## 2018-05-21 ENCOUNTER — Ambulatory Visit: Payer: Medicare Other | Admitting: Physician Assistant

## 2018-05-24 ENCOUNTER — Encounter
Admission: RE | Admit: 2018-05-24 | Discharge: 2018-05-24 | Disposition: A | Discharge status: Home / Self Care | Payer: Medicare Other | Source: Ambulatory Visit | Attending: Internal Medicine | Admitting: Internal Medicine

## 2018-05-24 NOTE — Assessment & Plan Note (Signed)
Stable. Improving. In knee immobilizer- conservative treatment. On Cholecalciferol 4,000 units po Q Day.

## 2018-05-24 NOTE — Assessment & Plan Note (Signed)
Stable. Continues with conservative treatment with knee immobilizer. Pt reports increased pain in the evenings. Also reports intermittent spasms. On APAP 1,000 mg po TID and prn Oxycodone.

## 2018-05-24 NOTE — Progress Notes (Signed)
Location:    [facility] Home Room Number: 301B Place of Service:  SNF (31) Provider:  Lorenso Quarry, NP-C  System, Pcp Not In  Patient Care Team: System, Pcp Not In as PCP - General  Extended Emergency Contact Information Primary Emergency Contact: [name] Home Phone: [phone] Mobile Phone: [phone] Relation: Sister Secondary Emergency Contact: [name] Home Phone: [phone] Mobile Phone: [phone] Relation: Relative  Code Status:  DNR Goals of care: Advanced Directive information Advanced Directives 05/17/2018  Does Patient Have a Medical Advance Directive? Yes  Type of Advance Directive Out of facility DNR (pink MOST or yellow form)  Does patient want to make changes to medical advance directive? No - Patient declined  Would patient like information on creating a medical advance directive? -     Chief Complaint  Patient presents with  . Medical Management of Chronic Issues    Routine Visit    HPI:  Pt is a 84 y.o. female seen today for medical management of chronic diseases.    Age-related osteoporosis with current pathological fracture with routine healing Stable. Improving. In knee immobilizer- conservative treatment. On Cholecalciferol 4,000 units po Q Day.  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 Wound is granulating. Being followed by wound care RN. Treatment with Santyl, Allevyn dressing Q Day and Zinc 220 mg po Q Day along with ProStat and Ensure BID  Tibia/fibula fracture, right, closed, initial encounter Stable. Continues with conservative treatment with knee immobilizer. Pt reports increased pain in the evenings. Also reports intermittent spasms. On APAP 1,000 mg po TID and prn Oxycodone.     Past Medical History:  Diagnosis Date  . Age-related osteoporosis with current pathol fx with routine healing 01/18/2018  . Anxiety   . Arthritis   . At risk for falls   . Balance problems   . Benign hypertensive CKD 01/18/2018  .  Cataract   . Chronic kidney disease   . Dementia arising in the senium and presenium 01/18/2018  . Depression   . Disease of thyroid gland   . Frail elderly   . Gout   . Hypertension   . Impaired mobility   . Memory loss   . Neuromuscular disorder (HCC)   . Primary open angle glaucoma 09/11/2015  . Vision problems   . Visual impairment    Past Surgical History:  Procedure Laterality Date  . PARTIAL HYSTERECTOMY      No Known Allergies  Allergies as of 05/17/2018   No Known Allergies     Medication List        Accurate as of 05/17/18 11:59 PM. Always use your most recent med list.          acetaminophen 500 MG tablet Commonly known as:  TYLENOL Take 1,000 mg by mouth 3 (three) times daily.   allopurinol 100 MG tablet Commonly known as:  ZYLOPRIM Take 100 mg by mouth daily.   aspirin EC 81 MG tablet Take 81 mg by mouth daily.   Cholecalciferol 4000 units Caps Take 4,000 Units by mouth daily.   [employer] BACITRACIN ZINC ointment Generic drug:  bacitracin Apply 1 application topically daily. Place a small amount of ointment on a 2x2, place on wound on posterior right calf, cover with Allevyn Daily. (May leave Allevyn in place and change Q 3 days)   cyanocobalamin 1000 MCG tablet Take 1,000 mcg by mouth daily.   ENSURE ENLIVE PO Take 1 Bottle by mouth 2 (two) times daily between meals.   feeding supplement (PRO-STAT  SUGAR FREE 64) Liqd Take 30 mLs by mouth 2 (two) times daily between meals.   latanoprost 0.005 % ophthalmic solution Commonly known as:  XALATAN Place 1 drop into both eyes at bedtime.   losartan 25 MG tablet Commonly known as:  COZAAR Take 25 mg by mouth daily.   metoprolol tartrate 50 MG tablet Commonly known as:  LOPRESSOR Take 1 tablet (50 mg total) by mouth 2 (two) times daily.   mirtazapine 15 MG tablet Commonly known as:  REMERON Take 15 mg by mouth at bedtime.   NO-STING SKIN-PREP EX Apply topically. Apply liberal amount to the  tips of the toes and B-heels BID for skin protection   oxyCODONE 5 MG immediate release tablet Commonly known as:  Oxy IR/ROXICODONE Take 2 tablets (10 mg total) by mouth every 4 (four) hours as needed for moderate pain.   polyethylene glycol packet Commonly known as:  MIRALAX / GLYCOLAX Take 17 g by mouth daily as needed. Mix with 4-8 ounces fluid-- for constipation   SANTYL ointment Generic drug:  collagenase cleanse left heel wound with ns, apply santyl nickel thick to slough, cover with ns gauze and Allevyn dressing and also Cleanse right leg wound with ns, skin prep periwound, apply santyl to wound bed nickel thick, cover with ns gauze and foam dressing.   zinc sulfate 220 (50 Zn) MG capsule Take 220 mg by mouth daily.       Review of Systems  Constitutional: Negative for activity change, appetite change, chills, diaphoresis and fever.  HENT: Negative for congestion, mouth sores, nosebleeds, postnasal drip, sneezing, sore throat, trouble swallowing and voice change.   Respiratory: Negative for apnea, cough, choking, chest tightness, shortness of breath and wheezing.   Cardiovascular: Negative for chest pain, palpitations and leg swelling.  Gastrointestinal: Negative for abdominal distention, abdominal pain, constipation, diarrhea and nausea.  Genitourinary: Negative for difficulty urinating, dysuria, frequency and urgency.  Musculoskeletal: Positive for arthralgias (typical arthritis) and gait problem. Negative for back pain and myalgias.  Skin: Positive for wound. Negative for color change, pallor and rash.  Neurological: Positive for weakness. Negative for dizziness, tremors, syncope, speech difficulty, numbness and headaches.  Psychiatric/Behavioral: Negative for agitation and behavioral problems.  All other systems reviewed and are negative.   Immunization History  Administered Date(s) Administered  . Influenza,inj,Quad PF,6+ Mos 09/11/2015, 10/08/2016  .  Influenza,inj,quad, With Preservative 10/05/2017, 11/12/2017  . Pneumococcal Conjugate-13 10/08/2016  . Pneumococcal Polysaccharide-23 11/12/2017   Pertinent  Health Maintenance Due  Topic Date Due  . INFLUENZA VACCINE  06/24/2018  . DEXA SCAN  Completed  . PNA vac Low Risk Adult  Completed   No flowsheet data found. Functional Status Survey:    Vitals:   05/17/18 1012  BP: 129/73  Pulse: 82  Resp: 16  Temp: 98.4 F (36.9 C)  TempSrc: Oral  SpO2: 99%  Weight: 119 lb (54 kg)  Height: 5\' 4"  (1.626 m)   Body mass index is 20.43 kg/m. Physical Exam  Constitutional: She is oriented to person, place, and time. Vital signs are normal. She appears well-developed and well-nourished. She is active and cooperative. She does not appear ill. No distress.  HENT:  Head: Normocephalic and atraumatic.  Mouth/Throat: Uvula is midline, oropharynx is clear and moist and mucous membranes are normal. Mucous membranes are not pale, not dry and not cyanotic.  Eyes: Pupils are equal, round, and reactive to light. Conjunctivae, EOM and lids are normal.  Neck: Trachea normal, normal range of motion  and full passive range of motion without pain. Neck supple. No JVD present. No tracheal deviation, no edema and no erythema present. No thyromegaly present.  Cardiovascular: Normal rate, regular rhythm, normal heart sounds, intact distal pulses and normal pulses. Exam reveals no gallop, no distant heart sounds and no friction rub.  No murmur heard. Pulses:      Dorsalis pedis pulses are 2+ on the right side, and 2+ on the left side.  No edema  Pulmonary/Chest: Effort normal and breath sounds normal. No accessory muscle usage. No respiratory distress. She has no decreased breath sounds. She has no wheezes. She has no rhonchi. She has no rales. She exhibits no tenderness.  Abdominal: Soft. Normal appearance and bowel sounds are normal. She exhibits no distension and no ascites. There is no tenderness.    Musculoskeletal: Normal range of motion. She exhibits no edema.       Right lower leg: She exhibits tenderness.  Expected osteoarthritis, stiffness; Bilateral Calves soft, supple. Negative Homan's Sign. B- pedal pulses equal; decreased mobility, generalized weakness  Neurological: She is alert and oriented to person, place, and time. She has normal strength.  Skin: Skin is warm, dry and intact. She is not diaphoretic. No cyanosis. No pallor. Nails show no clubbing.     Psychiatric: She has a normal mood and affect. Her speech is normal and behavior is normal. Judgment and thought content normal. Cognition and memory are impaired. She exhibits abnormal recent memory.  Nursing note and vitals reviewed.   Labs reviewed: Recent Labs    01/15/18 0521 01/17/18 0334 01/26/18 0530  NA 142 142 144  K 4.0 4.1 3.9  CL 116* 117* 113*  CO2 20* 22 22  GLUCOSE 96 113* 75  BUN 13 12 21*  CREATININE 1.41* 1.30* 1.36*  CALCIUM 8.0* 7.8* 8.5*  MG  --   --  1.5*   Recent Labs    01/13/18 1429 01/15/18 0521 01/26/18 0530  AST 52* 59* 32  ALT 17 21 13*  ALKPHOS 95 62 138*  BILITOT 1.1 0.9 0.9  PROT 6.2* 4.8* 5.6*  ALBUMIN 3.4* 2.6* 2.4*   Recent Labs    01/13/18 1247 01/14/18 0347 01/26/18 0530  WBC 14.8* 9.5 7.8  NEUTROABS  --   --  5.2  HGB 12.8 10.8* 10.1*  HCT 39.3 32.8* 30.4*  MCV 103.2* 102.1* 103.3*  PLT 292 211 311   Lab Results  Component Value Date   TSH 1.729 01/26/2018   No results found for: HGBA1C Lab Results  Component Value Date   CHOL 109 01/26/2018   HDL 38 (L) 01/26/2018   LDLCALC 53 01/26/2018   TRIG 90 01/26/2018   CHOLHDL 2.9 01/26/2018    Significant Diagnostic Results in last 30 days:  No results found.  Assessment/Plan [name] was seen today for medical management of chronic issues.  Diagnoses and all orders for this visit:  Tibia/fibula fracture, right, closed, with routine healing, subsequent encounter  Pressure ulcer caused by  device  Age-related osteoporosis with current pathological fracture with routine healing   Above listed conditions stable  Continue current medication regimen, except  Schedule Oxycodone 5 mg po TID for pain  Continue daily dressing changes  Remove immobilizer Q shift for skin assessment  Monitor for worsening skin breakdown  Continue po nutritional supplements  Safety precautions  Fall precautions  Continue to be followed by Palliative Medicine  Family/ staff Communication:   Total Time:  Documentation:  Face to Face:  Family/Phone:  Labs/tests ordered:  Not due. Recent labs reviewed- stable  Medication list reviewed and assessed for continued appropriateness. Monthly medication orders reviewed and signed.  Brynda Rim, NP-C Geriatrics [facility] [phone], [place] [address] Cell Phone (Mon-Fri 8am-5pm):  [phone] On Call:  [phone] & follow prompts after 5pm & weekends Office Phone:  [phone] Office Fax:  [phone]

## 2018-05-24 NOTE — Assessment & Plan Note (Signed)
Stable. Wound is granulating. Being followed by wound care RN. Treatment with Santyl, Allevyn dressing Q Day and Zinc 220 mg po Q Day along with ProStat and Ensure BID

## 2018-06-01 ENCOUNTER — Ambulatory Visit: Payer: Medicare Other | Admitting: Physician Assistant

## 2018-06-04 ENCOUNTER — Encounter: Payer: Medicare Other | Attending: Physician Assistant | Admitting: Physician Assistant

## 2018-06-04 DIAGNOSIS — L89899 Pressure ulcer of other site, unspecified stage: Secondary | ICD-10-CM | POA: Insufficient documentation

## 2018-06-04 DIAGNOSIS — L97212 Non-pressure chronic ulcer of right calf with fat layer exposed: Secondary | ICD-10-CM | POA: Diagnosis not present

## 2018-06-04 DIAGNOSIS — N183 Chronic kidney disease, stage 3 (moderate): Secondary | ICD-10-CM | POA: Insufficient documentation

## 2018-06-04 DIAGNOSIS — Z66 Do not resuscitate: Secondary | ICD-10-CM | POA: Insufficient documentation

## 2018-06-04 DIAGNOSIS — L89623 Pressure ulcer of left heel, stage 3: Secondary | ICD-10-CM | POA: Diagnosis present

## 2018-06-04 DIAGNOSIS — Z87891 Personal history of nicotine dependence: Secondary | ICD-10-CM | POA: Insufficient documentation

## 2018-06-04 DIAGNOSIS — I129 Hypertensive chronic kidney disease with stage 1 through stage 4 chronic kidney disease, or unspecified chronic kidney disease: Secondary | ICD-10-CM | POA: Insufficient documentation

## 2018-06-04 DIAGNOSIS — I872 Venous insufficiency (chronic) (peripheral): Secondary | ICD-10-CM | POA: Diagnosis not present

## 2018-06-07 ENCOUNTER — Other Ambulatory Visit: Payer: Self-pay

## 2018-06-07 MED ORDER — OXYCODONE HCL 5 MG PO TABS: 5.0000 mg | ORAL_TABLET | ORAL | 0 refills | Status: DC | PRN

## 2018-06-07 MED ORDER — OXYCODONE HCL 5 MG PO TABS: 5.0000 mg | ORAL_TABLET | Freq: Three times a day (TID) | ORAL | 0 refills | Status: DC

## 2018-06-07 NOTE — Telephone Encounter (Signed)
Rx sent to [facility] phone : [phone] , fax : [phone]  

## 2018-06-08 ENCOUNTER — Other Ambulatory Visit: Payer: Self-pay

## 2018-06-08 MED ORDER — OXYCODONE HCL 5 MG PO TABS: 5.0000 mg | ORAL_TABLET | ORAL | 0 refills | Status: DC | PRN

## 2018-06-08 MED ORDER — OXYCODONE HCL 5 MG PO TABS: 5.0000 mg | ORAL_TABLET | Freq: Three times a day (TID) | ORAL | 0 refills | Status: DC

## 2018-06-08 NOTE — Progress Notes (Signed)
Lindsay, [name] ([id number]) Visit Report for 06/04/2018 Chief Complaint Document Details Patient Name: [name], [name]. Date of Service: 06/04/2018 10:00 AM Medical Record Number: [id number] Patient Account Number: [id number] Date of Birth/Sex: 1926-08-13 (84 y.o. F) Treating RN: Curtis Sites Primary Care Provider: Einar Crow Other Clinician: Referring Provider: Einar Crow Treating Provider/Extender: Linwood Dibbles, Joselle Deeds Weeks in Treatment: 5 Information Obtained from: Patient Chief Complaint She is here for multiple wounds Electronic Signature(s) Signed: 06/06/2018 11:23:32 PM By: Lenda Kelp PA-C Entered By: Lenda Kelp on 06/04/2018 10:09:28 Lindsay, [name] ([id number]) -------------------------------------------------------------------------------- Debridement Details Patient Name: [name]. Date of Service: 06/04/2018 10:00 AM Medical Record Number: [id number] Patient Account Number: [id number] Date of Birth/Sex: 1926-08-21 (84 y.o. F) Treating RN: Curtis Sites Primary Care Provider: Einar Crow Other Clinician: Referring Provider: Einar Crow Treating Provider/Extender: Linwood Dibbles, Analie Katzman Weeks in Treatment: 5 Debridement Performed for Wound #2 Left Calcaneus Assessment: Performed By: Physician STONE III, Mareesa Gathright E., PA-C Debridement Type: Debridement Pre-procedure Verification/Time Yes - 10:28 Out Taken: Start Time: 10:28 Pain Control: Lidocaine 4% Topical Solution Total Area Debrided (L x W): 0.4 (cm) x 0.3 (cm) = 0.12 (cm) Tissue and other material Non-Viable, Slough, Skin: Dermis , Skin: Epidermis, Slough debrided: Level: Skin/Epidermis Debridement Description: Selective/Open Wound Instrument: Curette Bleeding: None End Time: 10:31 Procedural Pain: 0 Post Procedural Pain: 0 Response to Treatment: Procedure was tolerated well Level of Consciousness: Awake and Alert Post Debridement Measurements of Total  Wound Length: (cm) 0.4 Stage: Category/Stage III Width: (cm) 0.3 Depth: (cm) 0.2 Volume: (cm) 0.019 Character of Wound/Ulcer Post Improved Debridement: Post Procedure Diagnosis Same as Pre-procedure Electronic Signature(s) Signed: 06/06/2018 11:23:32 PM By: Lenda Kelp PA-C Signed: 06/07/2018 5:53:18 PM By: Curtis Sites Entered By: Curtis Sites on 06/04/2018 10:30:03 [name] ([id number]) -------------------------------------------------------------------------------- HPI Details Patient Name: [name]. Date of Service: 06/04/2018 10:00 AM Medical Record Number: [id number] Patient Account Number: [id number] Date of Birth/Sex: Apr 11, 1926 (84 y.o. F) Treating RN: Curtis Sites Primary Care Provider: Einar Crow Other Clinician: Referring Provider: Einar Crow Treating Provider/Extender: Linwood Dibbles, Noele Icenhour Weeks in Treatment: 5 History of Present Illness HPI Description: 04/30/18- She is here for initial evaluation of right posterior calf and left heel pressure ulcers. She has a documented history of dementia but presents today alert and oriented o3, confused with time frame/dates pertaining to recent injury, hospitalization and placementotherwise she appears appropriate to make decisions at today's visit. On 01/13/18 she was admitted to [facility] after a fall, she sustained a closed fracture to the proximal tib-fib on the right side. She was treated nonsurgically with a full-length leg immobilizer/brace and NWB status. She began to complain of pain to the posterior aspect of the right leg, on 5/21 she was noted to have a pressure injury, the patient states that she was exchanged to a shorter knee immobilizer on Tuesday 6/4. This is currently being treated with Santyl at the nursing home, she was transitioned from rehabilitation to long-term care at [facility] [place]. She also presents with a left heel pressure injury of  unknown chronicity; there is no record of it in nursing home documentation. She had a blister noted to the left great toe, according to nursing home documentation, on 3/7. This now appears as a reabsorbing blood blister/deep tissue injury and is not currently open. She denies wearing any heel open offloading boots for heel pressure relief. She has been advised to keep pressure off of left heel and right posterior leg at  all times; offloading heel boots have been ordered for facility to provide. She is currently on Bactrim for culture (5/31) with trimeth/sulfa sensitive staph species (coag neg). She does admit to loose stools, 1-3 per day; will monitor next week for improvement and consider testing for c-diff. She is a DNR/Pallitive care per facility records. She does have reduced ABI in office today and we discussed the potential for delayed healing given this result, the risk for deterioration given the level and possible need for future testing/intervention. It is her decision that we treat conservatively at this time, not sending her for additional tests. 05/07/18 on evaluation today patient appears to be doing somewhat better I feel in regard to her ulcers on the left heel and the right lateral leg. She's been tolerating the dressing changes without complication with that being said she continues to have some discomfort she is also still recovering from her tib/fib fracture on the right. She has additional swelling of the right lower extremity secondary to this. No fevers, chills, nausea, or vomiting noted at this time. 05/18/18 on evaluation today patient's wound on the left heel actually appears to be doing better. Unfortunately the wound on the right lateral lower extremity actually appears to be about the same maybe even a little bit more bruising noted today. Unfortunately. With that being said I think the main issue is that she's getting pressure to the site when she actually is sitting in  her wheelchair which she does for most of the day according what she tells me today. I do think that a Prevalon Boots may help to offload this to a degree and this is something that I did recommend for her today. We have previously it looks like order bilateral Prevalon Boots but nonetheless she has one for the left not for the right she tells me. We were going to see if we can work on that. 06/04/18 on evaluation today patient actually appears to be doing better in regard to left heel ulcer that was actually some dead skin surrounding it appeared to be somewhat macerated I think she may tolerate some light debridement here in order to allow for a different dressing possibly Prisma to be utilized. In regard to the right lateral lower extremity I think that Santyl will likely still be the most appropriate dressing. Electronic Signature(s) Signed: 06/06/2018 11:23:32 PM By: Lenda Kelp PA-C Entered By: Lenda Kelp on 06/06/2018 23:08:42 AADYA, KINDLER ([id number]) -------------------------------------------------------------------------------- Physical Exam Details Patient Name: [name], [name] Date of Service: 06/04/2018 10:00 AM Medical Record Number: [id number] Patient Account Number: [id number] Date of Birth/Sex: May 20, 1926 (84 y.o. F) Treating RN: Curtis Sites Primary Care Provider: Einar Crow Other Clinician: Referring Provider: Einar Crow Treating Provider/Extender: STONE III, Tunisha Ruland Weeks in Treatment: 5 Constitutional Thin and well-hydrated in no acute distress. Respiratory normal breathing without difficulty. Psychiatric this patient is able to make decisions and demonstrates good insight into disease process. Alert and Oriented x 3. pleasant and cooperative. Notes Patient's wounds today did have slough noted on the right lateral lower extremity and her left heel had a little bit of dry skin noted surrounding the wound bed unfortunately. This was  today sharply debrided away flexibly to clear away the dead tissue post debridement the wound bed actually appears to be doing fairly well I do believe that she is a good candidate for switching to Prisma in that regard. Electronic Signature(s) Signed: 06/06/2018 11:23:32 PM By: Lenda Kelp PA-C Entered By: Linwood Dibbles,  Irma Roulhac on 06/06/2018 23:09:32 [name], [name] ([id number]) -------------------------------------------------------------------------------- Physician Orders Details Patient Name: ARHONDA, VANHUSS. Date of Service: 06/04/2018 10:00 AM Medical Record Number: [id number] Patient Account Number: [id number] Date of Birth/Sex: Aug 10, 1926 (84 y.o. F) Treating RN: Curtis Sites Primary Care Provider: Einar Crow Other Clinician: Referring Provider: Einar Crow Treating Provider/Extender: Linwood Dibbles, Abagayle Klutts Weeks in Treatment: 5 Verbal / Phone Orders: No Diagnosis Coding ICD-10 Coding Code Description [phone] Pressure ulcer of left heel, stage 3 L89.95 Pressure ulcer of unspecified site, unstageable I87.2 Venous insufficiency (chronic) (peripheral) N18.3 Chronic kidney disease, stage 3 (moderate) L08.9 Local infection of the skin and subcutaneous tissue, unspecified Wound Cleansing Wound #1 Right,Posterior Lower Leg o Clean wound with Normal Saline. o May Shower, gently pat wound dry prior to applying new dressing. Wound #2 Left Calcaneus o Clean wound with Normal Saline. o May Shower, gently pat wound dry prior to applying new dressing. Anesthetic (add to Medication List) Wound #1 Right,Posterior Lower Leg o Topical Lidocaine 4% cream applied to wound bed prior to debridement (In Clinic Only). Wound #2 Left Calcaneus o Topical Lidocaine 4% cream applied to wound bed prior to debridement (In Clinic Only). Primary Wound Dressing Wound #1 Right,Posterior Lower Leg o Santyl Ointment Wound #2 Left Calcaneus o Silver Collagen Secondary  Dressing Wound #1 Right,Posterior Lower Leg o Dry Gauze o Boardered Foam Dressing Wound #2 Left Calcaneus o Dry Gauze o Boardered Foam Dressing Dressing Change Frequency Wound #1 Right,Posterior Lower Leg [name], [name] ([id number]) o Change dressing every day. Wound #2 Left Calcaneus o Change dressing every day. Follow-up Appointments Wound #1 Right,Posterior Lower Leg o Return Appointment in 2 weeks. Wound #2 Left Calcaneus o Return Appointment in 2 weeks. Edema Control Wound #1 Right,Posterior Lower Leg o Elevate legs to the level of the heart and pump ankles as often as possible Wound #2 Left Calcaneus o Elevate legs to the level of the heart and pump ankles as often as possible Off-Loading Wound #1 Right,Posterior Lower Leg o Other: - PLEASE PROVIDE PATIENT WITH BILATERAL PREVALON BOOTS FOR PATIENT TO WEAR AT ALL TIMES WHILE IN BED AND IN WHEELCHAIR Wound #2 Left Calcaneus o Other: - PLEASE PROVIDE PATIENT WITH BILATERAL PREVALON BOOTS FOR PATIENT TO WEAR AT ALL TIMES WHILE IN BED AND IN [facility] Additional Orders / Instructions Wound #1 Right,Posterior Lower Leg o Increase protein intake. o Activity as tolerated o Other: - Please add facility wound healing vitamins per facility protocol Wound #2 Left Calcaneus o Increase protein intake. o Activity as tolerated o Other: - Please add facility wound healing vitamins per facility protocol Electronic Signature(s) Signed: 06/06/2018 11:23:32 PM By: Lenda Kelp PA-C Signed: 06/07/2018 5:53:18 PM By: Curtis Sites Entered By: Curtis Sites on 06/04/2018 10:33:14 Sens, Angle Rexene Edison ([id number]) -------------------------------------------------------------------------------- Problem List Details Patient Name: Riga, Rei H. Date of Service: 06/04/2018 10:00 AM Medical Record Number: [id number] Patient Account Number: [id number] Date of Birth/Sex: 06/08/1926 (84 y.o.  F) Treating RN: Curtis Sites Primary Care Provider: Einar Crow Other Clinician: Referring Provider: Einar Crow Treating Provider/Extender: Linwood Dibbles, Palmina Clodfelter Weeks in Treatment: 5 Active Problems ICD-10 Evaluated Encounter Code Description Active Date Today Diagnosis [phone] Pressure ulcer of left heel, stage 3 04/30/2018 No Yes L89.95 Pressure ulcer of unspecified site, unstageable 04/30/2018 No Yes I87.2 Venous insufficiency (chronic) (peripheral) 04/30/2018 No Yes N18.3 Chronic kidney disease, stage 3 (moderate) 04/30/2018 No Yes L08.9 Local infection of the skin and subcutaneous tissue, 04/30/2018 No Yes unspecified Inactive Problems Resolved Problems Electronic Signature(s) Signed:  06/06/2018 11:23:32 PM By: Lenda Kelp PA-C Entered By: Lenda Kelp on 06/04/2018 10:09:23 [name] ([id number]) -------------------------------------------------------------------------------- Progress Note Details Patient Name: [name], [name] Date of Service: 06/04/2018 10:00 AM Medical Record Number: [id number] Patient Account Number: [id number] Date of Birth/Sex: 11/11/1926 (84 y.o. F) Treating RN: Curtis Sites Primary Care Provider: Einar Crow Other Clinician: Referring Provider: Einar Crow Treating Provider/Extender: Linwood Dibbles, Henchy Mccauley Weeks in Treatment: 5 Subjective Chief Complaint Information obtained from Patient She is here for multiple wounds History of Present Illness (HPI) 04/30/18- She is here for initial evaluation of right posterior calf and left heel pressure ulcers. She has a documented history of dementia but presents today alert and oriented o3, confused with time frame/dates pertaining to recent injury, hospitalization and placementotherwise she appears appropriate to make decisions at today's visit. On 01/13/18 she was admitted to [facility] after a fall, she sustained a closed fracture to the proximal  tib-fib on the right side. She was treated nonsurgically with a full-length leg immobilizer/brace and NWB status. She began to complain of pain to the posterior aspect of the right leg, on 5/21 she was noted to have a pressure injury, the patient states that she was exchanged to a shorter knee immobilizer on Tuesday 6/4. This is currently being treated with Santyl at the nursing home, she was transitioned from rehabilitation to long-term care at [facility] [place]. She also presents with a left heel pressure injury of unknown chronicity; there is no record of it in nursing home documentation. She had a blister noted to the left great toe, according to nursing home documentation, on 3/7. This now appears as a reabsorbing blood blister/deep tissue injury and is not currently open. She denies wearing any heel open offloading boots for heel pressure relief. She has been advised to keep pressure off of left heel and right posterior leg at all times; offloading heel boots have been ordered for facility to provide. She is currently on Bactrim for culture (5/31) with trimeth/sulfa sensitive staph species (coag neg). She does admit to loose stools, 1-3 per day; will monitor next week for improvement and consider testing for c-diff. She is a DNR/Pallitive care per facility records. She does have reduced ABI in office today and we discussed the potential for delayed healing given this result, the risk for deterioration given the level and possible need for future testing/intervention. It is her decision that we treat conservatively at this time, not sending her for additional tests. 05/07/18 on evaluation today patient appears to be doing somewhat better I feel in regard to her ulcers on the left heel and the right lateral leg. She's been tolerating the dressing changes without complication with that being said she continues to have some discomfort she is also still recovering from her tib/fib fracture on the  right. She has additional swelling of the right lower extremity secondary to this. No fevers, chills, nausea, or vomiting noted at this time. 05/18/18 on evaluation today patient's wound on the left heel actually appears to be doing better. Unfortunately the wound on the right lateral lower extremity actually appears to be about the same maybe even a little bit more bruising noted today. Unfortunately. With that being said I think the main issue is that she's getting pressure to the site when she actually is sitting in her wheelchair which she does for most of the day according what she tells me today. I do think that a Prevalon Boots may help to  offload this to a degree and this is something that I did recommend for her today. We have previously it looks like order bilateral Prevalon Boots but nonetheless she has one for the left not for the right she tells me. We were going to see if we can work on that. 06/04/18 on evaluation today patient actually appears to be doing better in regard to left heel ulcer that was actually some dead skin surrounding it appeared to be somewhat macerated I think she may tolerate some light debridement here in order to allow for a different dressing possibly Prisma to be utilized. In regard to the right lateral lower extremity I think that Santyl will likely still be the most appropriate dressing. Patient History Information obtained from Patient. Family History HESTA, ADAMSON ([id number]) Diabetes - Father, Heart Disease - Father, Kidney Disease - Mother, No family history of Cancer, Hypertension, Lung Disease, Seizures, Stroke, Thyroid Problems, Tuberculosis. Social History Former smoker - 20 years stopped, Marital Status - Widowed, Alcohol Use - Rarely, Drug Use - No History, Caffeine Use - Daily. Review of Systems (ROS) Constitutional Symptoms (General Health) Denies complaints or symptoms of Fever, Chills. Respiratory The patient has no complaints or  symptoms. Cardiovascular The patient has no complaints or symptoms. Psychiatric The patient has no complaints or symptoms. Objective Constitutional Thin and well-hydrated in no acute distress. Vitals Time Taken: 10:00 AM, Height: 62 in, Temperature: 98.2 F, Pulse: 72 bpm, Respiratory Rate: 16 breaths/min, Blood Pressure: 153/60 mmHg. Respiratory normal breathing without difficulty. Psychiatric this patient is able to make decisions and demonstrates good insight into disease process. Alert and Oriented x 3. pleasant and cooperative. General Notes: Patient's wounds today did have slough noted on the right lateral lower extremity and her left heel had a little bit of dry skin noted surrounding the wound bed unfortunately. This was today sharply debrided away flexibly to clear away the dead tissue post debridement the wound bed actually appears to be doing fairly well I do believe that she is a good candidate for switching to Prisma in that regard. Integumentary (Hair, Skin) Wound #1 status is Open. Original cause of wound was Pressure Injury. The wound is located on the Right,Posterior Lower Leg. The wound measures 3.8cm length x 1.2cm width x 0.2cm depth; 3.581cm^2 area and 0.716cm^3 volume. There is Fat Layer (Subcutaneous Tissue) Exposed exposed. There is no tunneling or undermining noted. There is a large amount of serosanguineous drainage noted. The wound margin is distinct with the outline attached to the wound base. There is small (1-33%) red, pink granulation within the wound bed. There is a large (67-100%) amount of necrotic tissue within the wound bed including Eschar and Adherent Slough. The periwound skin appearance did not exhibit: Callus, Crepitus, Excoriation, Induration, Rash, Scarring, Dry/Scaly, Maceration, Atrophie Blanche, Cyanosis, Ecchymosis, Hemosiderin Staining, Mottled, Pallor, Rubor, Erythema. Periwound temperature was noted as No Abnormality. The periwound has  tenderness on palpation. Wound #2 status is Open. Original cause of wound was Pressure Injury. The wound is located on the Left Calcaneus. The wound measures 0.4cm length x 0.3cm width x 0.2cm depth; 0.094cm^2 area and 0.019cm^3 volume. There is Fat Layer Pagliuca, [name] ([id number]) (Subcutaneous Tissue) Exposed exposed. There is no tunneling or undermining noted. There is a small amount of serosanguineous drainage noted. The wound margin is distinct with the outline attached to the wound base. There is no granulation within the wound bed. There is no necrotic tissue within the wound bed. The periwound skin appearance  exhibited: Callus, Induration. The periwound skin appearance did not exhibit: Crepitus, Excoriation, Rash, Scarring, Dry/Scaly, Maceration, Atrophie Blanche, Cyanosis, Ecchymosis, Hemosiderin Staining, Mottled, Pallor, Rubor, Erythema. Periwound temperature was noted as No Abnormality. The periwound has tenderness on palpation. Assessment Active Problems ICD-10 Pressure ulcer of left heel, stage 3 Pressure ulcer of unspecified site, unstageable Venous insufficiency (chronic) (peripheral) Chronic kidney disease, stage 3 (moderate) Local infection of the skin and subcutaneous tissue, unspecified Procedures Wound #2 Pre-procedure diagnosis of Wound #2 is a Pressure Ulcer located on the Left Calcaneus . There was a Selective/Open Wound Skin/Epidermis Debridement with a total area of 0.12 sq cm performed by STONE III, Caedyn Raygoza E., PA-C. With the following instrument(s): Curette to remove Non-Viable tissue/material. Material removed includes Slough, Skin: Dermis, and Skin: Epidermis after achieving pain control using Lidocaine 4% Topical Solution. No specimens were taken. A time out was conducted at 10:28, prior to the start of the procedure. There was no bleeding. The procedure was tolerated well with a pain level of 0 throughout and a pain level of 0 following the procedure.  Patient s Level of Consciousness post procedure was recorded as Awake and Alert. Post Debridement Measurements: 0.4cm length x 0.3cm width x 0.2cm depth; 0.019cm^3 volume. Post debridement Stage noted as Category/Stage III. Character of Wound/Ulcer Post Debridement is improved. Post procedure Diagnosis Wound #2: Same as Pre-Procedure Plan Wound Cleansing: Wound #1 Right,Posterior Lower Leg: Clean wound with Normal Saline. May Shower, gently pat wound dry prior to applying new dressing. Wound #2 Left Calcaneus: Clean wound with Normal Saline. May Shower, gently pat wound dry prior to applying new dressing. Anesthetic (add to Medication List): Wound #1 Right,Posterior Lower Leg: Topical Lidocaine 4% cream applied to wound bed prior to debridement (In Clinic Only). Wound #2 Left Calcaneus: [name], Damiyah H. ([id number]) Topical Lidocaine 4% cream applied to wound bed prior to debridement (In Clinic Only). Primary Wound Dressing: Wound #1 Right,Posterior Lower Leg: Santyl Ointment Wound #2 Left Calcaneus: Silver Collagen Secondary Dressing: Wound #1 Right,Posterior Lower Leg: Dry Gauze Boardered Foam Dressing Wound #2 Left Calcaneus: Dry Gauze Boardered Foam Dressing Dressing Change Frequency: Wound #1 Right,Posterior Lower Leg: Change dressing every day. Wound #2 Left Calcaneus: Change dressing every day. Follow-up Appointments: Wound #1 Right,Posterior Lower Leg: Return Appointment in 2 weeks. Wound #2 Left Calcaneus: Return Appointment in 2 weeks. Edema Control: Wound #1 Right,Posterior Lower Leg: Elevate legs to the level of the heart and pump ankles as often as possible Wound #2 Left Calcaneus: Elevate legs to the level of the heart and pump ankles as often as possible Off-Loading: Wound #1 Right,Posterior Lower Leg: Other: - PLEASE PROVIDE PATIENT WITH BILATERAL PREVALON BOOTS FOR PATIENT TO WEAR AT ALL TIMES WHILE IN BED AND IN WHEELCHAIR Wound #2 Left  Calcaneus: Other: - PLEASE PROVIDE PATIENT WITH BILATERAL PREVALON BOOTS FOR PATIENT TO WEAR AT ALL TIMES WHILE IN BED AND IN [facility] Additional Orders / Instructions: Wound #1 Right,Posterior Lower Leg: Increase protein intake. Activity as tolerated Other: - Please add facility wound healing vitamins per facility protocol Wound #2 Left Calcaneus: Increase protein intake. Activity as tolerated Other: - Please add facility wound healing vitamins per facility protocol We will continue with the above wound care orders for the next week and see where things stand at follow-up. Please see above for specific wound care orders. We will see patient for re-evaluation in 1 week(s) here in the clinic. If anything worsens or changes patient will contact our office for additional recommendations. Electronic  Signature(s) Signed: 06/06/2018 11:23:32 PM By: Reva Bores, Lamiyah HMarland Kitchen ([id number]) Entered By: Lenda Kelp on 06/06/2018 23:21:20 [name], [name] ([id number]) -------------------------------------------------------------------------------- ROS/PFSH Details Patient Name: [name]. Date of Service: 06/04/2018 10:00 AM Medical Record Number: [id number] Patient Account Number: [id number] Date of Birth/Sex: Jul 01, 1926 (84 y.o. F) Treating RN: Curtis Sites Primary Care Provider: Einar Crow Other Clinician: Referring Provider: Einar Crow Treating Provider/Extender: Linwood Dibbles, [place] Hope Weeks in Treatment: 5 Information Obtained From Patient Wound History Do you currently have one or more open woundso Yes How many open wounds do you currently haveo 2 Approximately how long have you had your woundso 2 weeks How have you been treating your wound(s) until nowo 2 weeks Has your wound(s) ever healed and then re-openedo No Have you had any lab work done in the past montho No Have you tested positive for an antibiotic resistant organism (MRSA, VRE)o  No Have you tested positive for osteomyelitis (bone infection)o No Have you had any tests for circulation on your legso No Constitutional Symptoms (General Health) Complaints and Symptoms: Negative for: Fever; Chills Eyes Medical History: Positive for: Cataracts - one eye Negative for: Glaucoma; Optic Neuritis Ear/Nose/Mouth/Throat Medical History: Negative for: Chronic sinus problems/congestion; Middle ear problems Hematologic/Lymphatic Medical History: Positive for: Lymphedema Negative for: Anemia; Hemophilia; Human Immunodeficiency Virus; Sickle Cell Disease Respiratory Complaints and Symptoms: No Complaints or Symptoms Medical History: Negative for: Aspiration; Asthma; Chronic Obstructive Pulmonary Disease (COPD); Pneumothorax; Sleep Apnea; Tuberculosis Cardiovascular Complaints and Symptoms: No Complaints or Symptoms Medical HistoryMIKAIA, JANVIER ([id number]) Positive for: Hypertension Negative for: Angina; Arrhythmia; Coronary Artery Disease; Deep Vein Thrombosis; Hypotension; Myocardial Infarction; Peripheral Arterial Disease; Peripheral Venous Disease; Phlebitis; Vasculitis Gastrointestinal Medical History: Negative for: Cirrhosis ; Colitis; Crohnos; Hepatitis A; Hepatitis B; Hepatitis C Endocrine Medical History: Negative for: Type I Diabetes; Type II Diabetes Genitourinary Medical History: Negative for: End Stage Renal Disease Immunological Medical History: Negative for: Lupus Erythematosus; Raynaudos; Scleroderma Integumentary (Skin) Medical History: Negative for: History of Burn; History of pressure wounds Musculoskeletal Medical History: Positive for: Rheumatoid Arthritis Negative for: Gout; Osteoarthritis; Osteomyelitis Neurologic Medical History: Negative for: Dementia; Neuropathy; Quadriplegia; Paraplegia; Seizure Disorder Psychiatric Complaints and Symptoms: No Complaints or Symptoms Medical History: Negative for: Anorexia/bulimia;  Confinement Anxiety HBO Extended History Items Eyes: Cataracts Immunizations Pneumococcal Vaccine: Received Pneumococcal Vaccination: Yes Implantable Devices Family and Social History [name], [name] ([id number]) Cancer: No; Diabetes: Yes - Father; Heart Disease: Yes - Father; Hypertension: No; Kidney Disease: Yes - Mother; Lung Disease: No; Seizures: No; Stroke: No; Thyroid Problems: No; Tuberculosis: No; Former smoker - 20 years stopped; Marital Status - Widowed; Alcohol Use: Rarely; Drug Use: No History; Caffeine Use: Daily; Financial Concerns: No; Food, Clothing or Shelter Needs: No; Support System Lacking: No; Transportation Concerns: No; Advanced Directives: Yes (Not Provided); Patient does not want information on Advanced Directives; Do not resuscitate: No; Living Will: Yes (Not Provided); Medical Power of Attorney: No Physician Affirmation I have reviewed and agree with the above information. Electronic Signature(s) Signed: 06/06/2018 11:23:32 PM By: Lenda Kelp PA-C Signed: 06/07/2018 5:53:18 PM By: Curtis Sites Entered By: Lenda Kelp on 06/06/2018 23:09:04 [name], [name] ([id number]) -------------------------------------------------------------------------------- SuperBill Details Patient Name: [name]. Date of Service: 06/04/2018 Medical Record Number: [id number] Patient Account Number: [id number] Date of Birth/Sex: 1926/10/05 (84 y.o. F) Treating RN: Curtis Sites Primary Care Provider: Einar Crow Other Clinician: Referring Provider: Einar Crow Treating Provider/Extender: STONE III, Maha Fischel Weeks in Treatment: 5 Diagnosis Coding ICD-10 Codes  Code Description [phone] Pressure ulcer of left heel, stage 3 L89.95 Pressure ulcer of unspecified site, unstageable I87.2 Venous insufficiency (chronic) (peripheral) N18.3 Chronic kidney disease, stage 3 (moderate) L08.9 Local infection of the skin and subcutaneous tissue,  unspecified Facility Procedures CPT4 Code: [id number] Description: 97597 - DEBRIDE WOUND 1ST 20 SQ CM OR < ICD-10 Diagnosis Description L89.623 Pressure ulcer of left heel, stage 3 Modifier: Quantity: 1 Physician Procedures CPT4 Code: [id number] Description: 97597 - WC PHYS DEBR WO ANESTH 20 SQ CM ICD-10 Diagnosis Description L89.623 Pressure ulcer of left heel, stage 3 Modifier: Quantity: 1 Electronic Signature(s) Signed: 06/06/2018 11:23:32 PM By: Lenda Kelp PA-C Entered By: Lenda Kelp on 06/06/2018 23:21:33

## 2018-06-08 NOTE — Progress Notes (Signed)
IYARI, HAGNER ([id number]) Visit Report for 06/04/2018 Arrival Information Details Patient Name: Lindsay, [name]. Date of Service: 06/04/2018 10:00 AM Medical Record Number: [id number] Patient Account Number: [id number] Date of Birth/Sex: 04/10/1926 (84 y.o. F) Treating RN: Rema Jasmine Primary Care [name]: [name] Other Clinician: Referring [name]: [name] Treating [name]/Extender: [name], [name] Weeks in Treatment: 5 Visit Information History Since Last Visit Added or deleted any medications: No Patient Arrived: Wheel Chair Any new allergies or adverse reactions: No Arrival Time: 10:05 Had a fall or experienced change in No Transfer Assistance: Other activities of daily living that may affect Patient Identification Verified: Yes risk of falls: Secondary Verification Process Completed: Yes Signs or symptoms of abuse/neglect since last visito No Hospitalized since last visit: No Implantable device outside of the clinic excluding No cellular tissue based products placed in the center since last visit: Has Dressing in Place as Prescribed: Yes Pain Present Now: No Notes pt requested to maintain in her [facility] Electronic Signature(s) Signed: 06/04/2018 10:24:24 AM By: Rema Jasmine Entered By: Rema Jasmine on 06/04/2018 10:06:06 [name], [name] ([id number]) -------------------------------------------------------------------------------- Lower Extremity Assessment Details Patient Name: [name], [name] Date of Service: 06/04/2018 10:00 AM Medical Record Number: [id number] Patient Account Number: [id number] Date of Birth/Sex: Oct 23, 1926 (84 y.o. F) Treating RN: Rema Jasmine Primary Care Kailan Carmen: [name] Other Clinician: Referring Zion Ta: [name] Treating Meriel Kelliher/Extender: [name], [name] Weeks in Treatment: 5 Edema Assessment Assessed: [Left: No] [Right: No] [Left: Edema] [Right: :] Calf Left: Right: Point of Measurement:  26 cm From Medial Instep 25 cm 27 cm Ankle Left: Right: Point of Measurement: 10 cm From Medial Instep 20.5 cm 21 cm Vascular Assessment Claudication: Claudication Assessment [Left:None] [Right:None] Pulses: Dorsalis Pedis Palpable: [Left:Yes] [Right:Yes] Posterior Tibial Extremity colors, hair growth, and conditions: Extremity Color: [Left:Normal] [Right:Normal] Hair Growth on Extremity: [Left:No] [Right:No] Temperature of Extremity: [Left:Cool] [Right:Cool] Capillary Refill: [Left:< 3 seconds] [Right:< 3 seconds] Toe Nail Assessment Left: Right: Thick: Yes Yes Discolored: Yes Yes Deformed: Yes Improper Length and Hygiene: Yes Yes Electronic Signature(s) Signed: 06/04/2018 10:24:24 AM By: Rema Jasmine Entered By: Rema Jasmine on 06/04/2018 10:18:57 [name], [name] ([id number]) -------------------------------------------------------------------------------- Multi Wound Chart Details Patient Name: [name], [name] Date of Service: 06/04/2018 10:00 AM Medical Record Number: [id number] Patient Account Number: [id number] Date of Birth/Sex: May 03, 1926 (84 y.o. F) Treating RN: [name] Primary Care Dilyn Smiles: [name] Other Clinician: Referring Chisom Aust: [name] Treating Alitzel Cookson/Extender: [name], [name] Weeks in Treatment: 5 Vital Signs Height(in): 62 Pulse(bpm): 72 Weight(lbs): Blood Pressure(mmHg): 153/60 Body Mass Index(BMI): Temperature(F): 98.2 Respiratory Rate 16 (breaths/min): Photos: [N/A:N/A] Wound Location: Right Lower Leg - Posterior Left Calcaneus N/A Wounding Event: Pressure Injury Pressure Injury N/A Primary Etiology: Pressure Ulcer Pressure Ulcer N/A Comorbid History: Cataracts, Lymphedema, Cataracts, Lymphedema, N/A Hypertension, Rheumatoid Hypertension, Rheumatoid Arthritis Arthritis Date Acquired: 02/08/2018 04/10/2018 N/A Weeks of Treatment: 5 5 N/A Wound Status: Open Open N/A Measurements L x W x D 3.8x1.2x0.2  0.4x0.3x0.2 N/A (cm) Area (cm) : 3.581 0.094 N/A Volume (cm) : 0.716 0.019 N/A % Reduction in Area: -30.30% 40.10% N/A % Reduction in Volume: -30.20% 38.70% N/A Classification: Category/Stage III Category/Stage III N/A Exudate Amount: Large Small N/A Exudate Type: Serosanguineous Serosanguineous N/A Exudate Color: red, brown red, brown N/A Wound Margin: Distinct, outline attached Distinct, outline attached N/A Granulation Amount: Small (1-33%) None Present (0%) N/A Granulation Quality: Red, Pink N/A N/A Necrotic Amount: Large (67-100%) None Present (0%) N/A Necrotic Tissue: Eschar, Adherent Slough N/A N/A Exposed Structures: Fat Layer (  Subcutaneous Fat Layer (Subcutaneous N/A Tissue) Exposed: Yes Tissue) Exposed: Yes Fascia: No Fascia: No Tendon: No Tendon: No Muscle: No Muscle: No [name], [name] ([id number]) Joint: No Joint: No Bone: No Bone: No Epithelialization: None None N/A Periwound Skin Texture: Excoriation: No Induration: Yes N/A Induration: No Callus: Yes Callus: No Excoriation: No Crepitus: No Crepitus: No Rash: No Rash: No Scarring: No Scarring: No Periwound Skin Moisture: Maceration: No Maceration: No N/A Dry/Scaly: No Dry/Scaly: No Periwound Skin Color: Atrophie Blanche: No Atrophie Blanche: No N/A Cyanosis: No Cyanosis: No Ecchymosis: No Ecchymosis: No Erythema: No Erythema: No Hemosiderin Staining: No Hemosiderin Staining: No Mottled: No Mottled: No Pallor: No Pallor: No Rubor: No Rubor: No Temperature: No Abnormality No Abnormality N/A Tenderness on Palpation: Yes Yes N/A Wound Preparation: Ulcer Cleansing: Ulcer Cleansing: N/A Rinsed/Irrigated with Saline Rinsed/Irrigated with Saline Topical Anesthetic Applied: Topical Anesthetic Applied: Other: lidocaine 4% Other: lidocaine 4% Treatment Notes Electronic Signature(s) Signed: 06/07/2018 5:53:18 PM By: [name] Entered By: [name] on 06/04/2018  10:28:59 [name] ([id number]) -------------------------------------------------------------------------------- Multi-Disciplinary Care Plan Details Patient Name: [name], [name] Date of Service: 06/04/2018 10:00 AM Medical Record Number: [id number] Patient Account Number: [id number] Date of Birth/Sex: 10/14/1926 (84 y.o. F) Treating RN: [name] Primary Care Eliceo Gladu: [name] Other Clinician: Referring Khristian Seals: [name] Treating Malyssa Maris/Extender: [name], [name] Weeks in Treatment: 5 Active Inactive ` Abuse / Safety / Falls / Self Care Management Nursing Diagnoses: Impaired physical mobility Goals: Patient will not experience any injury related to falls Date Initiated: 04/30/2018 Target Resolution Date: 05/29/2018 Goal Status: Active Interventions: Assess fall risk on admission and as needed Notes: ` Nutrition Nursing Diagnoses: Potential for alteratiion in Nutrition/Potential for imbalanced nutrition Goals: Patient/caregiver agrees to and verbalizes understanding of need to use nutritional supplements and/or vitamins as prescribed Date Initiated: 04/30/2018 Target Resolution Date: 07/16/2018 Goal Status: Active Interventions: Assess patient nutrition upon admission and as needed per policy Notes: ` Orientation to the Wound Care Program Nursing Diagnoses: Knowledge deficit related to the wound healing center program Goals: Patient/caregiver will verbalize understanding of the Wound Healing Center Program Date Initiated: 04/30/2018 Target Resolution Date: 07/30/2018 Goal Status: Active Interventions: PAETON, BAYES ([id number]) Provide education on orientation to the wound center Notes: ` Wound/Skin Impairment Nursing Diagnoses: Impaired tissue integrity Goals: Ulcer/skin breakdown will heal within 14 weeks Date Initiated: 04/30/2018 Target Resolution Date: 07/30/2018 Goal Status: Active Interventions: Assess  patient/caregiver ability to obtain necessary supplies Assess patient/caregiver ability to perform ulcer/skin care regimen upon admission and as needed Assess ulceration(s) every visit Notes: Electronic Signature(s) Signed: 06/07/2018 5:53:18 PM By: [name] Entered By: [name] on 06/04/2018 10:28:44 Tayloe, [name] ([id number]) -------------------------------------------------------------------------------- Pain Assessment Details Patient Name: [name]. Date of Service: 06/04/2018 10:00 AM Medical Record Number: [id number] Patient Account Number: [id number] Date of Birth/Sex: 1926-08-20 (84 y.o. F) Treating RN: Rema Jasmine Primary Care Tuwanda Vokes: [name] Other Clinician: Referring Jonus Coble: [name] Treating Treesa Mccully/Extender: [name], [name] Weeks in Treatment: 5 Active Problems Location of Pain Severity and Description of Pain Patient Has Paino No Site Locations Pain Management and Medication Current Pain Management: Goals for Pain Management Topical or injectable lidocaine is offered to patient for acute pain when surgical debridement is performed. If needed, Patient is instructed to use over the counter pain medication for the following 24-48 hours after debridement. Wound care MDs do not prescribed pain medications. Patient has chronic pain or uncontrolled pain. Patient has been instructed to make an appointment with their Primary  Care Physician for pain management. Electronic Signature(s) Signed: 06/04/2018 10:24:24 AM By: Rema Jasmine Entered By: Rema Jasmine on 06/04/2018 10:07:09 [name] ([id number]) -------------------------------------------------------------------------------- Wound Assessment Details Patient Name: [name], [name] Date of Service: 06/04/2018 10:00 AM Medical Record Number: [id number] Patient Account Number: [id number] Date of Birth/Sex: 1926/02/28 (84 y.o. F) Treating RN: Rema Jasmine Primary Care  Tiziana Cislo: [name] Other Clinician: Referring Ashleyanne Hemmingway: [name] Treating Karam Dunson/Extender: [name], [name] Weeks in Treatment: 5 Wound Status Wound Number: 1 Primary Pressure Ulcer Etiology: Wound Location: Right Lower Leg - Posterior Wound Status: Open Wounding Event: Pressure Injury Comorbid Cataracts, Lymphedema, Hypertension, Date Acquired: 02/08/2018 History: Rheumatoid Arthritis Weeks Of Treatment: 5 Clustered Wound: No Photos Photo Uploaded By: Rema Jasmine on 06/04/2018 10:23:59 Wound Measurements Length: (cm) 3.8 Width: (cm) 1.2 Depth: (cm) 0.2 Area: (cm) 3.581 Volume: (cm) 0.716 % Reduction in Area: -30.3% % Reduction in Volume: -30.2% Epithelialization: None Tunneling: No Undermining: No Wound Description Classification: Category/Stage III Foul Odor A Wound Margin: Distinct, outline attached Slough/Fibr Exudate Amount: Large Exudate Type: Serosanguineous Exudate Color: red, brown fter Cleansing: No ino Yes Wound Bed Granulation Amount: Small (1-33%) Exposed Structure Granulation Quality: Red, Pink Fascia Exposed: No Necrotic Amount: Large (67-100%) Fat Layer (Subcutaneous Tissue) Exposed: Yes Necrotic Quality: Eschar, Adherent Slough Tendon Exposed: No Muscle Exposed: No Joint Exposed: No Bone Exposed: No Periwound Skin Texture [name], [name] ([id number]) Texture Color No Abnormalities Noted: No No Abnormalities Noted: No Callus: No Atrophie Blanche: No Crepitus: No Cyanosis: No Excoriation: No Ecchymosis: No Induration: No Erythema: No Rash: No Hemosiderin Staining: No Scarring: No Mottled: No Pallor: No Moisture Rubor: No No Abnormalities Noted: No Dry / Scaly: No Temperature / Pain Maceration: No Temperature: No Abnormality Tenderness on Palpation: Yes Wound Preparation Ulcer Cleansing: Rinsed/Irrigated with Saline Topical Anesthetic Applied: Other: lidocaine 4%, Electronic Signature(s) Signed:  06/04/2018 10:24:24 AM By: Rema Jasmine Entered By: Rema Jasmine on 06/04/2018 10:11:34 Struble, [name] ([id number]) -------------------------------------------------------------------------------- Wound Assessment Details Patient Name: [name], [name] Date of Service: 06/04/2018 10:00 AM Medical Record Number: [id number] Patient Account Number: [id number] Date of Birth/Sex: [birth year] (84 y.o. F) Treating RN: Rema Jasmine Primary Care Laelani Vasko: [name] Other Clinician: Referring Camani Sesay: [name] Treating Finlay Godbee/Extender: [name], [name] Weeks in Treatment: 5 Wound Status Wound Number: 2 Primary Pressure Ulcer Etiology: Wound Location: Left Calcaneus Wound Status: Open Wounding Event: Pressure Injury Comorbid Cataracts, Lymphedema, Hypertension, Date Acquired: 04/10/2018 History: Rheumatoid Arthritis Weeks Of Treatment: 5 Clustered Wound: No Photos Photo Uploaded By: Rema Jasmine on 06/04/2018 10:24:00 Wound Measurements Length: (cm) 0.4 Width: (cm) 0.3 Depth: (cm) 0.2 Area: (cm) 0.094 Volume: (cm) 0.019 % Reduction in Area: 40.1% % Reduction in Volume: 38.7% Epithelialization: None Tunneling: No Undermining: No Wound Description Classification: Category/Stage III Foul Odor A Wound Margin: Distinct, outline attached Slough/Fibr Exudate Amount: Small Exudate Type: Serosanguineous Exudate Color: red, brown fter Cleansing: No ino Yes Wound Bed Granulation Amount: None Present (0%) Exposed Structure Necrotic Amount: None Present (0%) Fascia Exposed: No Fat Layer (Subcutaneous Tissue) Exposed: Yes Tendon Exposed: No Muscle Exposed: No Joint Exposed: No Bone Exposed: No Periwound Skin Texture [name], [name] ([id number]) Texture Color No Abnormalities Noted: No No Abnormalities Noted: No Callus: Yes Atrophie Blanche: No Crepitus: No Cyanosis: No Excoriation: No Ecchymosis: No Induration: Yes Erythema: No Rash: No Hemosiderin  Staining: No Scarring: No Mottled: No Pallor: No Moisture Rubor: No No Abnormalities Noted: No Dry / Scaly: No Temperature / Pain Maceration: No Temperature: No Abnormality Tenderness on Palpation:  Yes Wound Preparation Ulcer Cleansing: Rinsed/Irrigated with Saline Topical Anesthetic Applied: Other: lidocaine 4%, Electronic Signature(s) Signed: 06/04/2018 10:24:24 AM By: Rema Jasmine Entered By: Rema Jasmine on 06/04/2018 10:15:55 Nixon, [name] ([id number]) -------------------------------------------------------------------------------- Vitals Details Patient Name: [name]. Date of Service: 06/04/2018 10:00 AM Medical Record Number: [id number] Patient Account Number: [id number] Date of Birth/Sex: Jan 21, 1926 (84 y.o. F) Treating RN: Rema Jasmine Primary Care Teagyn Fishel: [name] Other Clinician: Referring Hristopher Missildine: [name] Treating Gershom Brobeck/Extender: [name], [name] Weeks in Treatment: 5 Vital Signs Time Taken: 10:00 Temperature (F): 98.2 Height (in): 62 Pulse (bpm): 72 Respiratory Rate (breaths/min): 16 Blood Pressure (mmHg): 153/60 Reference Range: 80 - 120 mg / dl Electronic Signature(s) Signed: 06/04/2018 10:24:24 AM By: Rema Jasmine Entered ByRema Jasmine on 06/04/2018 10:07:43

## 2018-06-08 NOTE — Telephone Encounter (Signed)
Rx sent to [facility] phone : [phone] , fax : [phone]  

## 2018-06-15 ENCOUNTER — Non-Acute Institutional Stay (SKILLED_NURSING_FACILITY): Payer: Medicare Other | Admitting: Adult Health

## 2018-06-15 ENCOUNTER — Encounter: Payer: Self-pay | Admitting: Adult Health

## 2018-06-15 DIAGNOSIS — E538 Deficiency of other specified B group vitamins: Secondary | ICD-10-CM

## 2018-06-15 DIAGNOSIS — M1A9XX0 Chronic gout, unspecified, without tophus (tophi): Secondary | ICD-10-CM

## 2018-06-15 DIAGNOSIS — I10 Essential (primary) hypertension: Secondary | ICD-10-CM | POA: Diagnosis not present

## 2018-06-15 DIAGNOSIS — F32A Depression, unspecified: Secondary | ICD-10-CM

## 2018-06-15 DIAGNOSIS — F0390 Unspecified dementia without behavioral disturbance: Secondary | ICD-10-CM

## 2018-06-15 DIAGNOSIS — G8929 Other chronic pain: Secondary | ICD-10-CM

## 2018-06-15 DIAGNOSIS — F329 Major depressive disorder, single episode, unspecified: Secondary | ICD-10-CM | POA: Diagnosis not present

## 2018-06-15 NOTE — Progress Notes (Signed)
Location:  [facility] Room Number: 301B Place of Service:  SNF [phone]) Provider:  Kenard Gower, NP  Patient Care Team: Lauro Regulus, MD as PCP - General (Internal Medicine)  Extended Emergency Contact Information Primary Emergency Contact: Blair Heys Home Phone: [phone] Mobile Phone: [phone] Relation: Sister Secondary Emergency Contact: Luanne Bras Home Phone: [phone] Mobile Phone: [phone] Relation: Relative  Code Status:  DNR  Goals of care: Advanced Directive information Advanced Directives 06/15/2018  Does Patient Have a Medical Advance Directive? Yes  Type of Advance Directive Out of facility DNR (pink MOST or yellow form)  Does patient want to make changes to medical advance directive? No - Patient declined  Would patient like information on creating a medical advance directive? -  Pre-existing out of facility DNR order (yellow form or pink MOST form) Yellow form placed in chart (order not valid for inpatient use)     Chief Complaint  Patient presents with  . Medical Management of Chronic Issues    Routine Visit    HPI:  Pt is an 84 y.o. female seen today for medical management of chronic diseases.  She is a long-term care resident of [facility]	[place].  She has a PMH of CKD, dementia, depression/anxiety, osteoporosis, thyroid disease, and gout. She was seen in her room today.   Past Medical History:  Diagnosis Date  . Age-related osteoporosis with current pathol fx with routine healing 01/18/2018  . Anxiety   . Arthritis   . At risk for falls   . Balance problems   . Benign hypertensive CKD 01/18/2018  . Cataract   . Chronic kidney disease   . Dementia arising in the senium and presenium 01/18/2018  . Depression   . Disease of thyroid gland   . Frail elderly   . Gout   . Hypertension   . Impaired mobility   . Memory loss   . Neuromuscular disorder (HCC)   . Primary open angle glaucoma  09/11/2015  . Vision problems   . Visual impairment    Past Surgical History:  Procedure Laterality Date  . PARTIAL HYSTERECTOMY      No Known Allergies  Outpatient Encounter Medications as of 06/15/2018  Medication Sig  . acetaminophen (TYLENOL) 500 MG tablet Take 1,000 mg by mouth 3 (three) times daily.   Marland Kitchen allopurinol (ZYLOPRIM) 100 MG tablet Take 100 mg by mouth daily.   . Amino Acids-Protein Hydrolys (FEEDING SUPPLEMENT, PRO-STAT SUGAR FREE 64,) LIQD Take 30 mLs by mouth 2 (two) times daily between meals.  Marland Kitchen aspirin EC 81 MG tablet Take 81 mg by mouth daily.  . Cholecalciferol 4000 units CAPS Take 4,000 Units by mouth daily.  . collagenase (SANTYL) ointment cleanse left heel wound with ns, apply santyl nickel thick to slough, cover with ns gauze and Allevyn dressing and also Cleanse right leg wound with ns, skin prep periwound, apply santyl to wound bed nickel thick, cover with ns gauze and foam dressing.  . cyanocobalamin 1000 MCG tablet Take 1,000 mcg by mouth daily.  . Infant Care Products [facility] EX) Apply liberal amount topically to area of skin irritation prn. OK to leave at bedside.  . latanoprost (XALATAN) 0.005 % ophthalmic solution Place 1 drop into both eyes at bedtime.  Marland Kitchen losartan (COZAAR) 25 MG tablet Take 25 mg by mouth daily.  . methocarbamol (ROBAXIN) 500 MG tablet Take 500 mg by mouth every 8 (eight) hours as needed for muscle spasms.  . metoprolol tartrate (LOPRESSOR)  50 MG tablet Take 1 tablet (50 mg total) by mouth 2 (two) times daily.  . mirtazapine (REMERON) 15 MG tablet Take 15 mg by mouth at bedtime.   . Nutritional Supplements (ENSURE ENLIVE PO) Take 1 Bottle by mouth 2 (two) times daily between meals.  Marland Kitchen oxyCODONE (OXY IR/ROXICODONE) 5 MG immediate release tablet Take 1 tablet (5 mg total) by mouth every 4 (four) hours as needed.  Marland Kitchen oxyCODONE (OXY IR/ROXICODONE) 5 MG immediate release tablet Take 1 tablet (5 mg total) by mouth 3 (three) times daily.  .  polyethylene glycol (MIRALAX / GLYCOLAX) packet Take 17 g by mouth daily as needed. Mix with 4-8 ounces fluid-- for constipation   . sennosides-docusate sodium (SENOKOT-S) 8.6-50 MG tablet Take 2 tablets by mouth 2 (two) times daily.  . Skin Protectants, Misc. (NO-STING SKIN-PREP EX) Apply topically. Apply liberal amount to the tips of the toes and B-heels BID for skin protection  . zinc sulfate 220 (50 Zn) MG capsule Take 220 mg by mouth daily.  . [DISCONTINUED] bacitracin ([employer] BACITRACIN ZINC) ointment Apply 1 application topically daily. Place a small amount of ointment on a 2x2, place on wound on posterior right calf, cover with Allevyn Daily. (May leave Allevyn in place and change Q 3 days)   No facility-administered encounter medications on file as of 06/15/2018.     Review of Systems  GENERAL: No change in appetite, no fatigue, no weight changes, no fever, chills or weakness MOUTH and THROAT: Denies oral discomfort, gingival pain or bleeding RESPIRATORY: no cough, SOB, DOE, wheezing, hemoptysis CARDIAC: No chest pain, or palpitations GI: No abdominal pain, diarrhea, constipation, heart burn, nausea or vomiting PSYCHIATRIC: Denies feelings of depression or anxiety. No report of hallucinations, insomnia, paranoia, or agitation   Immunization History  Administered Date(s) Administered  . Influenza,inj,Quad PF,6+ Mos 09/11/2015, 10/08/2016  . Influenza,inj,quad, With Preservative 10/05/2017, 11/12/2017  . PPD Test 01/17/2018  . Pneumococcal Conjugate-13 10/08/2016  . Pneumococcal Polysaccharide-23 11/12/2017   Pertinent  Health Maintenance Due  Topic Date Due  . INFLUENZA VACCINE  06/24/2018  . DEXA SCAN  Completed  . PNA vac Low Risk Adult  Completed      Vitals:   06/15/18 1030  BP: 140/76  Pulse: 82  Resp: 12  Temp: 98.5 F (36.9 C)  TempSrc: Oral  SpO2: 100%  Weight: 115 lb (52.2 kg)  Height: 5\' 4"  (1.626 m)   Body mass index is 19.74 kg/m.  Physical  Exam  GENERAL APPEARANCE:  In no acute distress.  SKIN: Right lateral lower leg ulcer with dressing, left heel has black discoloration with dressing MOUTH and THROAT: Lips are without lesions. Oral mucosa is moist and without lesions. Tongue is normal in shape, size, and color and without lesions RESPIRATORY: Breathing is even & unlabored, BS CTAB CARDIAC: RRR, no murmur,no extra heart sounds, BLE 1+ edema GI: Abdomen soft, normal BS, no masses, no tenderness EXTREMITIES:  No cyanosis, right knee has immobilizer PSYCHIATRIC: Alert to self, disoriented to time and place.  Affect and behavior are appropriate   Labs reviewed: Recent Labs    01/15/18 0521 01/17/18 0334 01/26/18 0530  NA 142 142 144  K 4.0 4.1 3.9  CL 116* 117* 113*  CO2 20* 22 22  GLUCOSE 96 113* 75  BUN 13 12 21*  CREATININE 1.41* 1.30* 1.36*  CALCIUM 8.0* 7.8* 8.5*  MG  --   --  1.5*   Recent Labs    01/13/18 1429 01/15/18 01/17/18  01/26/18 0530  AST 52* 59* 32  ALT 17 21 13*  ALKPHOS 95 62 138*  BILITOT 1.1 0.9 0.9  PROT 6.2* 4.8* 5.6*  ALBUMIN 3.4* 2.6* 2.4*   Recent Labs    01/13/18 1247 01/14/18 0347 01/26/18 0530  WBC 14.8* 9.5 7.8  NEUTROABS  --   --  5.2  HGB 12.8 10.8* 10.1*  HCT 39.3 32.8* 30.4*  MCV 103.2* 102.1* 103.3*  PLT 292 211 311   Lab Results  Component Value Date   TSH 1.729 01/26/2018    Lab Results  Component Value Date   CHOL 109 01/26/2018   HDL 38 (L) 01/26/2018   LDLCALC 53 01/26/2018   TRIG 90 01/26/2018   CHOLHDL 2.9 01/26/2018    Assessment/Plan  1. Essential hypertension - well-controlled, continue Losartan 25 mg 1 tab daily, Metoprolol tartrate 50 mg 1 tab BID   2. 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 Other chronic pain - pain is well-controlled, Robaxin is not covered by her insurance so it will be discontinued upon depletion of current supply, will decrease Oxycodone 5  mg from TID to BID   4. Chronic depression - mood is stable, continue Mirtazapine 15 mg 1 tab Q HS   5. Vitamin B12 deficiency - continue Cyanocobalamin 1,000 mcg 1 tab daily Lab Results  Component Value Date   VITAMINB12 144 (L) 01/26/2018     6. Dementia without behavioral disturbance, unspecified dementia type - continue supportive care, fall precautions    Family/ staff Communication: Discussed plan of care with resident.  Labs/tests ordered:  CBC and CMP  Goals of care:   Long-term care   Kenard Gower, NP [facility] [phone] (Monday-Friday 8:00 a.m. - 5:00 p.m.) [phone] (after hours)

## 2018-06-16 ENCOUNTER — Other Ambulatory Visit
Admission: RE | Admit: 2018-06-16 | Discharge: 2018-06-16 | Disposition: A | Discharge status: Home / Self Care | Payer: Medicare Other | Source: Ambulatory Visit | Attending: Internal Medicine | Admitting: Internal Medicine

## 2018-06-16 DIAGNOSIS — I129 Hypertensive chronic kidney disease with stage 1 through stage 4 chronic kidney disease, or unspecified chronic kidney disease: Secondary | ICD-10-CM | POA: Insufficient documentation

## 2018-06-16 LAB — CBC WITH DIFFERENTIAL/PLATELET
BASOS PCT: 0 %
Basophils Absolute: 0 10*3/uL (ref 0–0.1)
EOS ABS: 0 10*3/uL (ref 0–0.7)
Eosinophils Relative: 0 %
HCT: 33.2 % — ABNORMAL LOW (ref 35.0–47.0)
HEMOGLOBIN: 11.1 g/dL — AB (ref 12.0–16.0)
Lymphocytes Relative: 45 %
Lymphs Abs: 2.2 10*3/uL (ref 1.0–3.6)
MCH: 33.6 pg (ref 26.0–34.0)
MCHC: 33.5 g/dL (ref 32.0–36.0)
MCV: 100.3 fL — ABNORMAL HIGH (ref 80.0–100.0)
Monocytes Absolute: 0.7 10*3/uL (ref 0.2–0.9)
Monocytes Relative: 14 %
NEUTROS PCT: 41 %
Neutro Abs: 2 10*3/uL (ref 1.4–6.5)
Platelets: 226 10*3/uL (ref 150–440)
RBC: 3.3 MIL/uL — AB (ref 3.80–5.20)
RDW: 16.5 % — ABNORMAL HIGH (ref 11.5–14.5)
WBC: 4.9 10*3/uL (ref 3.6–11.0)

## 2018-06-16 LAB — COMPREHENSIVE METABOLIC PANEL
ALBUMIN: 3.1 g/dL — AB (ref 3.5–5.0)
ALK PHOS: 101 U/L (ref 38–126)
ALT: 7 U/L (ref 0–44)
ANION GAP: 3 — AB (ref 5–15)
AST: 17 U/L (ref 15–41)
BILIRUBIN TOTAL: 0.6 mg/dL (ref 0.3–1.2)
BUN: 38 mg/dL — ABNORMAL HIGH (ref 8–23)
CO2: 22 mmol/L (ref 22–32)
Calcium: 9 mg/dL (ref 8.9–10.3)
Chloride: 119 mmol/L — ABNORMAL HIGH (ref 98–111)
Creatinine, Ser: 1.66 mg/dL — ABNORMAL HIGH (ref 0.44–1.00)
GFR calc non Af Amer: 26 mL/min — ABNORMAL LOW (ref >60)
GFR, EST AFRICAN AMERICAN: 30 mL/min — AB (ref >60)
GLUCOSE: 86 mg/dL (ref 70–99)
POTASSIUM: 3.5 mmol/L (ref 3.5–5.1)
SODIUM: 144 mmol/L (ref 135–145)
TOTAL PROTEIN: 6.1 g/dL — AB (ref 6.5–8.1)

## 2018-06-17 ENCOUNTER — Encounter: Payer: Medicare Other | Admitting: Nurse Practitioner

## 2018-06-17 DIAGNOSIS — L89623 Pressure ulcer of left heel, stage 3: Secondary | ICD-10-CM | POA: Diagnosis not present

## 2018-06-23 DIAGNOSIS — M109 Gout, unspecified: Secondary | ICD-10-CM | POA: Insufficient documentation

## 2018-06-24 ENCOUNTER — Encounter: Payer: Medicare Other | Attending: Nurse Practitioner | Admitting: Nurse Practitioner

## 2018-06-24 ENCOUNTER — Encounter
Admission: RE | Admit: 2018-06-24 | Discharge: 2018-06-24 | Disposition: A | Discharge status: Home / Self Care | Payer: Medicare Other | Source: Ambulatory Visit | Attending: Internal Medicine | Admitting: Internal Medicine

## 2018-06-24 DIAGNOSIS — I10 Essential (primary) hypertension: Secondary | ICD-10-CM | POA: Diagnosis not present

## 2018-06-24 DIAGNOSIS — M069 Rheumatoid arthritis, unspecified: Secondary | ICD-10-CM | POA: Diagnosis not present

## 2018-06-24 DIAGNOSIS — Z87891 Personal history of nicotine dependence: Secondary | ICD-10-CM | POA: Diagnosis not present

## 2018-06-24 DIAGNOSIS — I872 Venous insufficiency (chronic) (peripheral): Secondary | ICD-10-CM | POA: Insufficient documentation

## 2018-06-24 DIAGNOSIS — L89623 Pressure ulcer of left heel, stage 3: Secondary | ICD-10-CM | POA: Insufficient documentation

## 2018-06-24 DIAGNOSIS — Z66 Do not resuscitate: Secondary | ICD-10-CM | POA: Diagnosis not present

## 2018-06-24 DIAGNOSIS — L89894 Pressure ulcer of other site, stage 4: Secondary | ICD-10-CM | POA: Insufficient documentation

## 2018-06-29 ENCOUNTER — Other Ambulatory Visit: Payer: Self-pay

## 2018-06-29 MED ORDER — OXYCODONE HCL 5 MG PO TABS: 5.0000 mg | ORAL_TABLET | Freq: Two times a day (BID) | ORAL | 0 refills | Status: DC

## 2018-06-29 NOTE — Telephone Encounter (Signed)
Rx sent to [facility] phone : [phone] , fax : [phone]  

## 2018-07-07 NOTE — Progress Notes (Signed)
Lindsay, APPERSON ([id number]) Visit Report for 06/24/2018 Chief Complaint Document Details Patient Name: [name], [name]. Date of Service: 06/24/2018 10:00 AM Medical Record Number: [id number] Patient Account Number: [id number] Date of Birth/Sex: 1926-04-03 (84 y.o. F) Treating RN: Phillis Haggis Primary Care Provider: Einar Crow Other Clinician: Referring Provider: Einar Crow Treating Provider/Extender: Kathreen Cosier in Treatment: 7 Information Obtained from: Patient Chief Complaint She is here for multiple wounds Electronic Signature(s) Signed: 06/24/2018 12:40:42 PM By: Bonnell Public Entered By: Bonnell Public on 06/24/2018 12:40:42 Prisco, Ashantee Rexene Edison ([id number]) -------------------------------------------------------------------------------- Debridement Details Patient Name: [name]. Date of Service: 06/24/2018 10:00 AM Medical Record Number: [id number] Patient Account Number: [id number] Date of Birth/Sex: 1926/10/29 (84 y.o. F) Treating RN: Phillis Haggis Primary Care Provider: Einar Crow Other Clinician: Referring Provider: Einar Crow Treating Provider/Extender: Kathreen Cosier in Treatment: 7 Debridement Performed for Wound #1 Right,Posterior Lower Leg Assessment: Performed By: Physician Bonnell Public, NP Debridement Type: Debridement Pre-procedure Verification/Time Yes - 10:55 Out Taken: Start Time: 10:55 Pain Control: Lidocaine 4% Topical Solution Total Area Debrided (L x W): 4 (cm) x 1 (cm) = 4 (cm) Tissue and other material Non-Viable, Slough, Subcutaneous, Fibrin/Exudate, Slough debrided: Level: Skin/Subcutaneous Tissue Debridement Description: Excisional Instrument: Blade, Forceps Bleeding: Minimum Hemostasis Achieved: Pressure End Time: 10:58 Procedural Pain: 0 Post Procedural Pain: 0 Response to Treatment: Procedure was tolerated well Level of Consciousness: Awake and Alert Post Debridement  Measurements of Total Wound Length: (cm) 4 Stage: Category/Stage IV Width: (cm) 1 Depth: (cm) 0.3 Volume: (cm) 0.942 Character of Wound/Ulcer Post Requires Further Debridement Debridement: Post Procedure Diagnosis Same as Pre-procedure Electronic Signature(s) Signed: 06/24/2018 12:39:34 PM By: Bonnell Public Signed: 06/28/2018 4:59:33 PM By: Alejandro Mulling Entered By: Bonnell Public on 06/24/2018 12:39:34 Gasper, Auriana Rexene Edison ([id number]) -------------------------------------------------------------------------------- HPI Details Patient Name: [name]. Date of Service: 06/24/2018 10:00 AM Medical Record Number: [id number] Patient Account Number: [id number] Date of Birth/Sex: [birth year] (84 y.o. F) Treating RN: Phillis Haggis Primary Care Provider: Einar Crow Other Clinician: Referring Provider: Einar Crow Treating Provider/Extender: Kathreen Cosier in Treatment: 7 History of Present Illness HPI Description: 04/30/18- She is here for initial evaluation of right posterior calf and left heel pressure ulcers. She has a documented history of dementia but presents today alert and oriented o3, confused with time frame/dates pertaining to recent injury, hospitalization and placementotherwise she appears appropriate to make decisions at today's visit. On 01/13/18 she was admitted to [facility] after a fall, she sustained a closed fracture to the proximal tib-fib on the right side. She was treated nonsurgically with a full-length leg immobilizer/brace and NWB status. She began to complain of pain to the posterior aspect of the right leg, on 5/21 she was noted to have a pressure injury, the patient states that she was exchanged to a shorter knee immobilizer on Tuesday 6/4. This is currently being treated with Santyl at the nursing home, she was transitioned from rehabilitation to long-term care at [facility] [place]. She also presents with a  left heel pressure injury of unknown chronicity; there is no record of it in nursing home documentation. She had a blister noted to the left great toe, according to nursing home documentation, on 3/7. This now appears as a reabsorbing blood blister/deep tissue injury and is not currently open. She denies wearing any heel open offloading boots for heel pressure relief. She has been advised to keep pressure off of left heel and right posterior leg at all times; offloading heel boots have  been ordered for facility to provide. She is currently on Bactrim for culture (5/31) with trimeth/sulfa sensitive staph species (coag neg). She does admit to loose stools, 1-3 per day; will monitor next week for improvement and consider testing for c-diff. She is a DNR/Pallitive care per facility records. She does have reduced ABI in office today and we discussed the potential for delayed healing given this result, the risk for deterioration given the level and possible need for future testing/intervention. It is her decision that we treat conservatively at this time, not sending her for additional tests. 05/07/18 on evaluation today patient appears to be doing somewhat better I feel in regard to her ulcers on the left heel and the right lateral leg. She's been tolerating the dressing changes without complication with that being said she continues to have some discomfort she is also still recovering from her tib/fib fracture on the right. She has additional swelling of the right lower extremity secondary to this. No fevers, chills, nausea, or vomiting noted at this time. 05/18/18 on evaluation today patient's wound on the left heel actually appears to be doing better. Unfortunately the wound on the right lateral lower extremity actually appears to be about the same maybe even a little bit more bruising noted today. Unfortunately. With that being said I think the main issue is that she's getting pressure to the site when  she actually is sitting in her wheelchair which she does for most of the day according what she tells me today. I do think that a Prevalon Boots may help to offload this to a degree and this is something that I did recommend for her today. We have previously it looks like order bilateral Prevalon Boots but nonetheless she has one for the left not for the right she tells me. We were going to see if we can work on that. 06/04/18 on evaluation today patient actually appears to be doing better in regard to left heel ulcer that was actually some dead skin surrounding it appeared to be somewhat macerated I think she may tolerate some light debridement here in order to allow for a different dressing possibly Prisma to be utilized. In regard to the right lateral lower extremity I think that Santyl will likely still be the most appropriate dressing. 06/17/18-She is seen in follow-up evaluation for right posterior calf and left posterior heel ulcer. 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 She continues to complain of significant pain to the left posterior heel. She is tolerating dressing changes which we will continue 06/24/18-she is seen in follow-up evaluation for right posterior calf and left posterior heel ulcer. The right posterior calf ulcer is stable/improved with any nonviable tissue, tendon remains exposed. The left posterior heel is improved with pain. She admits to compliance with wearing offloading boots at night while in bed. We will continue with same treatment plan she can follow- up in 2 weeks Electronic Signature(s) Signed: 06/24/2018 12:41:42 PM By: Elmore Guise ([id number]) Entered By: Bonnell Public on 06/24/2018 12:41:42 ANORA, SCHWENKE ([id number]) -------------------------------------------------------------------------------- Physician Orders Details Patient Name: Sammons, Tenna H. Date of Service: 06/24/2018 10:00 AM Medical Record  Number: [id number] Patient Account Number: [id number] Date of Birth/Sex: Nov 20, 1926 (84 y.o. F) Treating RN: Phillis Haggis Primary Care Provider: Einar Crow Other Clinician: Referring Provider: Einar Crow Treating Provider/Extender: Kathreen Cosier in Treatment: 7 Verbal / Phone Orders: Yes Clinician: Ashok Cordia, Debi Read Back and Verified: Yes Diagnosis Coding Wound Cleansing  Wound #1 Right,Posterior Lower Leg o Clean wound with Normal Saline. o Cleanse wound with mild soap and water o May Shower, gently pat wound dry prior to applying new dressing. Wound #2 Left Calcaneus o Clean wound with Normal Saline. o Cleanse wound with mild soap and water o May Shower, gently pat wound dry prior to applying new dressing. Anesthetic (add to Medication List) Wound #1 Right,Posterior Lower Leg o Topical Lidocaine 4% cream applied to wound bed prior to debridement (In Clinic Only). Wound #2 Left Calcaneus o Topical Lidocaine 4% cream applied to wound bed prior to debridement (In Clinic Only). Primary Wound Dressing Wound #1 Right,Posterior Lower Leg o Saline moistened gauze o Santyl Ointment Wound #2 Left Calcaneus o Silver Collagen - lightly moisten with saline Secondary Dressing Wound #1 Right,Posterior Lower Leg o Dry Gauze o Boardered Foam Dressing Wound #2 Left Calcaneus o Dry Gauze o Boardered Foam Dressing Dressing Change Frequency Wound #1 Right,Posterior Lower Leg o Change dressing every day. Wound #2 Left Calcaneus o Change dressing every day. Follow-up Appointments KAREE, FORGE ([id number]) Wound #1 Right,Posterior Lower Leg o Return Appointment in 2 weeks. Wound #2 Left Calcaneus o Return Appointment in 2 weeks. Edema Control Wound #1 Right,Posterior Lower Leg o Elevate legs to the level of the heart and pump ankles as often as possible Wound #2 Left Calcaneus o Elevate legs to the level of the  heart and pump ankles as often as possible Off-Loading Wound #1 Right,Posterior Lower Leg o Other: - PLEASE PROVIDE PATIENT WITH BILATERAL PREVALON BOOTS FOR PATIENT TO WEAR AT ALL TIMES WHILE IN BED AND IN WHEELCHAIR Wound #2 Left Calcaneus o Other: - PLEASE PROVIDE PATIENT WITH BILATERAL PREVALON BOOTS FOR PATIENT TO WEAR AT ALL TIMES WHILE IN BED AND IN [facility] Additional Orders / Instructions Wound #1 Right,Posterior Lower Leg o Increase protein intake. o Activity as tolerated o Other: - Please add facility wound healing vitamins per facility protocol Wound #2 Left Calcaneus o Increase protein intake. o Activity as tolerated o Other: - Please add facility wound healing vitamins per facility protocol Medications-please add to medication list. Wound #1 Right,Posterior Lower Leg o Santyl Enzymatic Ointment Patient Medications Allergies: No Known Drug Allergies Notifications Medication Indication Start End lidocaine DOSE 1 - topical 4 % cream - 1 cream topical Electronic Signature(s) Signed: 06/24/2018 9:54:05 PM By: Bonnell Public Signed: 06/28/2018 4:59:33 PM By: Alejandro Mulling Entered By: Alejandro Mulling on 06/24/2018 11:00:42 MIKIYAH, GLASNER ([id number]) -------------------------------------------------------------------------------- Prescription 06/24/2018 Patient Name: [name]. Provider: Bonnell Public NP Date of Birth: [birth year] NPI#: [id number] Sex: F DEA#: [id number] Phone #: [phone] License #: Patient Address: [facility] [address] [facility] [place], [place] [address] [address], [address] [place], [place] [address] [phone] Allergies No Known Drug Allergies Medication Medication: Route: Strength: Form: lidocaine topical 4% cream Class: TOPICAL LOCAL ANESTHETICS Dose: Frequency / Time: Indication: 1 1 cream topical Number of Refills: Number of  Units: 0 Generic Substitution: Start Date: End Date: Administered at Substitution Permitted Facility: Yes Time Administered: Time Discontinued: Note to Pharmacy: Signature(s): Date(s): Electronic Signature(s) Signed: 06/24/2018 9:54:05 PM By: Bonnell Public Signed: 06/28/2018 4:59:33 PM By: Alejandro Mulling Entered By: Alejandro Mulling on 06/24/2018 11:00:43 KAYDENSE, RIZO ([id number]) Perot, Carlene Coria ([id number]) --------------------------------------------------------------------------------  Problem List Details Patient Name: Applegate, Aseel H. Date of Service: 06/24/2018 10:00 AM Medical Record Number: [id number] Patient Account Number: [id number] Date of Birth/Sex: May 29, 1926 (84 y.o. F) Treating RN: Ashok Cordia,  Debi Primary Care Provider: Einar Crow Other Clinician: Referring Provider: Einar Crow Treating Provider/Extender: Kathreen Cosier in Treatment: 7 Active Problems ICD-10 Evaluated Encounter Code Description Active Date Today Diagnosis L89.623 Pressure ulcer of left heel, stage 3 04/30/2018 No Yes L89.94 Pressure ulcer of unspecified site, stage 4 04/30/2018 No Yes I87.2 Venous insufficiency (chronic) (peripheral) 04/30/2018 No Yes N18.3 Chronic kidney disease, stage 3 (moderate) 04/30/2018 No Yes L08.9 Local infection of the skin and subcutaneous tissue, 04/30/2018 No Yes unspecified Inactive Problems Resolved Problems Electronic Signature(s) Signed: 06/24/2018 12:39:00 PM By: Bonnell Public Entered By: Bonnell Public on 06/24/2018 12:39:00 Gorczyca, Carlene Coria ([id number]) -------------------------------------------------------------------------------- Progress Note Details Patient Name: [name]. Date of Service: 06/24/2018 10:00 AM Medical Record Number: [id number] Patient Account Number: [id number] Date of Birth/Sex: 07/23/1926 (84 y.o. F) Treating RN: Phillis Haggis Primary Care Provider: Einar Crow Other  Clinician: Referring Provider: Einar Crow Treating Provider/Extender: Kathreen Cosier in Treatment: 7 Subjective Chief Complaint Information obtained from Patient She is here for multiple wounds History of Present Illness (HPI) 04/30/18- She is here for initial evaluation of right posterior calf and left heel pressure ulcers. She has a documented history of dementia but presents today alert and oriented fo3, confused with time frame/dates pertaining to recent injury, hospitalization and placementotherwise she appears appropriate to make decisions at today's visit. On 01/13/18 she was admitted to [facility] after a fall, she sustained a closed fracture to the proximal tib-fib on the right side. She was treated nonsurgically with a full-length leg immobilizer/brace and NWB status. She began to complain of pain to the posterior aspect of the right leg, on 5/21 she was noted to have a pressure injury, the patient states that she was exchanged to a shorter knee immobilizer on Tuesday 6/4. This is currently being treated with Santyl at the nursing home, she was transitioned from rehabilitation to long-term care at [facility] [place]. She also presents with a left heel pressure injury of unknown chronicity; there is no record of it in nursing home documentation. She had a blister noted to the left great toe, according to nursing home documentation, on 3/7. This now appears as a reabsorbing blood blister/deep tissue injury and is not currently open. She denies wearing any heel open offloading boots for heel pressure relief. She has been advised to keep pressure off of left heel and right posterior leg at all times; offloading heel boots have been ordered for facility to provide. She is currently on Bactrim for culture (5/31) with trimeth/sulfa sensitive staph species (coag neg). She does admit to loose stools, 1-3 per day; will monitor next week for improvement and  consider testing for c-diff. She is a DNR/Pallitive care per facility records. She does have reduced ABI in office today and we discussed the potential for delayed healing given this result, the risk for deterioration given the level and possible need for future testing/intervention. It is her decision that we treat conservatively at this time, not sending her for additional tests. 05/07/18 on evaluation today patient appears to be doing somewhat better I feel in regard to her ulcers on the left heel and the right lateral leg. She's been tolerating the dressing changes without complication with that being said she continues to have some discomfort she is also still recovering from her tib/fib fracture on the right. She has additional swelling of the right lower extremity secondary to this. No fevers, chills, nausea, or vomiting noted at this time. 05/18/18 on evaluation today  patient's wound on the left heel actually appears to be doing better. Unfortunately the wound on the right lateral lower extremity actually appears to be about the same maybe even a little bit more bruising noted today. Unfortunately. With that being said I think the main issue is that she's getting pressure to the site when she actually is sitting in her wheelchair which she does for most of the day according what she tells me today. I do think that a Prevalon Boots may help to offload this to a degree and this is something that I did recommend for her today. We have previously it looks like order bilateral Prevalon Boots but nonetheless she has one for the left not for the right she tells me. We were going to see if we can work on that. 06/04/18 on evaluation today patient actually appears to be doing better in regard to left heel ulcer that was actually some dead skin surrounding it appeared to be somewhat macerated I think she may tolerate some light debridement here in order to allow for a different dressing possibly Prisma  to be utilized. In regard to the right lateral lower extremity I think that Santyl will likely still be the most appropriate dressing. 06/17/18-She is seen in follow-up evaluation for right posterior calf and left posterior heel ulcer. 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 She continues to complain of significant pain to the left posterior heel. She is tolerating dressing changes which we will continue 06/24/18-she is seen in follow-up evaluation for right posterior calf and left posterior heel ulcer. The right posterior calf ulcer is stable/improved with any nonviable tissue, tendon remains exposed. The left posterior heel is improved with pain. She admits KYLII, ENNIS. ([id number]) to compliance with wearing offloading boots at night while in bed. We will continue with same treatment plan she can follow- up in 2 weeks Objective Constitutional Vitals Time Taken: 10:25 AM, Height: 62 in, Temperature: 97.8 F, Pulse: 70 bpm, Respiratory Rate: 18 breaths/min, Blood Pressure: 164/61 mmHg. Integumentary (Hair, Skin) Wound #1 status is Open. Original cause of wound was Pressure Injury. The wound is located on the Right,Posterior Lower Leg. The wound measures 4cm length x 1cm width x 0.2cm depth; 3.142cm^2 area and 0.628cm^3 volume. There is tendon and Fat Layer (Subcutaneous Tissue) Exposed exposed. There is no tunneling or undermining noted. There is a large amount of serous drainage noted. The wound margin is flat and intact. There is no granulation within the wound bed. There is a large (67-100%) amount of necrotic tissue within the wound bed including Eschar and Adherent Slough. The periwound skin appearance did not exhibit: Callus, Crepitus, Excoriation, Induration, Rash, Scarring, Dry/Scaly, Maceration, Atrophie Blanche, Cyanosis, Ecchymosis, Hemosiderin Staining, Mottled, Pallor, Rubor, Erythema. Periwound temperature was noted as No Abnormality. The periwound  has tenderness on palpation. Wound #2 status is Open. Original cause of wound was Pressure Injury. The wound is located on the Left Calcaneus. The wound measures 0.4cm length x 0.2cm width x 0.1cm depth; 0.063cm^2 area and 0.006cm^3 volume. There is Fat Layer (Subcutaneous Tissue) Exposed exposed. There is no tunneling or undermining noted. There is a small amount of serous drainage noted. The wound margin is flat and intact. There is small (1-33%) red, pink granulation within the wound bed. There is a large (67-100%) amount of necrotic tissue within the wound bed including Adherent Slough. The periwound skin appearance exhibited: Callus, Induration. The periwound skin appearance did not exhibit: Crepitus, Excoriation,  Rash, Scarring, Dry/Scaly, Maceration, Atrophie Blanche, Cyanosis, Ecchymosis, Hemosiderin Staining, Mottled, Pallor, Rubor, Erythema. Periwound temperature was noted as No Abnormality. The periwound has tenderness on palpation. Assessment Active Problems ICD-10 Pressure ulcer of left heel, stage 3 Pressure ulcer of unspecified site, stage 4 Venous insufficiency (chronic) (peripheral) Chronic kidney disease, stage 3 (moderate) Local infection of the skin and subcutaneous tissue, unspecified Procedures Streiff, Laetitia H. ([id number]) Wound #1 Pre-procedure diagnosis of Wound #1 is a Pressure Ulcer located on the Right,Posterior Lower Leg . There was a Excisional Skin/Subcutaneous Tissue Debridement with a total area of 4 sq cm performed by Bonnell Public, NP. With the following instrument(s): Blade, and Forceps to remove Non-Viable tissue/material. Material removed includes Subcutaneous Tissue, Slough, and Fibrin/Exudate after achieving pain control using Lidocaine 4% Topical Solution. No specimens were taken. A time out was conducted at 10:55, prior to the start of the procedure. A Minimum amount of bleeding was controlled with Pressure. The procedure was tolerated well with a  pain level of 0 throughout and a pain level of 0 following the procedure. Patient s Level of Consciousness post procedure was recorded as Awake and Alert. Post Debridement Measurements: 4cm length x 1cm width x 0.3cm depth; 0.942cm^3 volume. Post debridement Stage noted as Category/Stage IV. Character of Wound/Ulcer Post Debridement requires further debridement. Post procedure Diagnosis Wound #1: Same as Pre-Procedure Plan Wound Cleansing: Wound #1 Right,Posterior Lower Leg: Clean wound with Normal Saline. Cleanse wound with mild soap and water May Shower, gently pat wound dry prior to applying new dressing. Wound #2 Left Calcaneus: Clean wound with Normal Saline. Cleanse wound with mild soap and water May Shower, gently pat wound dry prior to applying new dressing. Anesthetic (add to Medication List): Wound #1 Right,Posterior Lower Leg: Topical Lidocaine 4% cream applied to wound bed prior to debridement (In Clinic Only). Wound #2 Left Calcaneus: Topical Lidocaine 4% cream applied to wound bed prior to debridement (In Clinic Only). Primary Wound Dressing: Wound #1 Right,Posterior Lower Leg: Saline moistened gauze Santyl Ointment Wound #2 Left Calcaneus: Silver Collagen - lightly moisten with saline Secondary Dressing: Wound #1 Right,Posterior Lower Leg: Dry Gauze Boardered Foam Dressing Wound #2 Left Calcaneus: Dry Gauze Boardered Foam Dressing Dressing Change Frequency: Wound #1 Right,Posterior Lower Leg: Change dressing every day. Wound #2 Left Calcaneus: Change dressing every day. Follow-up Appointments: Wound #1 Right,Posterior Lower Leg: Return Appointment in 2 weeks. Wound #2 Left Calcaneus: Return Appointment in 2 weeks. Edema Control: Wound #1 Right,Posterior Lower Leg: Marcelino, Dezaria H. ([id number]) Elevate legs to the level of the heart and pump ankles as often as possible Wound #2 Left Calcaneus: Elevate legs to the level of the heart and pump ankles as  often as possible Off-Loading: Wound #1 Right,Posterior Lower Leg: Other: - PLEASE PROVIDE PATIENT WITH BILATERAL PREVALON BOOTS FOR PATIENT TO WEAR AT ALL TIMES WHILE IN BED AND IN [facility] Wound #2 Left Calcaneus: Other: - PLEASE PROVIDE PATIENT WITH BILATERAL PREVALON BOOTS FOR PATIENT TO WEAR AT ALL TIMES WHILE IN BED AND IN [facility] Additional Orders / Instructions: Wound #1 Right,Posterior Lower Leg: Increase protein intake. Activity as tolerated Other: - Please add facility wound healing vitamins per facility protocol Wound #2 Left Calcaneus: Increase protein intake. Activity as tolerated Other: - Please add facility wound healing vitamins per facility protocol Medications-please add to medication list.: Wound #1 Right,Posterior Lower Leg: Santyl Enzymatic Ointment The following medication(s) was prescribed: lidocaine topical 4 % cream 1 1 cream topical was prescribed at facility Electronic Signature(s) Signed:  06/24/2018 12:41:54 PM By: Bonnell Public Entered By: Bonnell Public on 06/24/2018 12:41:54 Bovee, Carlene Coria ([id number]) -------------------------------------------------------------------------------- SuperBill Details Patient Name: [name] H. Date of Service: 06/24/2018 Medical Record Number: [id number] Patient Account Number: [id number] Date of Birth/Sex: 18-Feb-1926 (84 y.o. F) Treating RN: Phillis Haggis Primary Care Provider: Einar Crow Other Clinician: Referring Provider: Einar Crow Treating Provider/Extender: Kathreen Cosier in Treatment: 7 Diagnosis Coding ICD-10 Codes Code Description [phone] Pressure ulcer of left heel, stage 3 L89.94 Pressure ulcer of unspecified site, stage 4 I87.2 Venous insufficiency (chronic) (peripheral) N18.3 Chronic kidney disease, stage 3 (moderate) L08.9 Local infection of the skin and subcutaneous tissue, unspecified Facility Procedures CPT4 Code: [id number] Description: 11042 - DEB SUBQ  TISSUE 20 SQ CM/< ICD-10 Diagnosis Description L89.94 Pressure ulcer of unspecified site, stage 4 Modifier: Quantity: 1 Physician Procedures CPT4 Code: [id number] Description: 11042 - WC PHYS SUBQ TISS 20 SQ CM ICD-10 Diagnosis Description L89.94 Pressure ulcer of unspecified site, stage 4 Modifier: Quantity: 1 Electronic Signature(s) Signed: 06/24/2018 12:42:06 PM By: Bonnell Public Entered By: Bonnell Public on 06/24/2018 12:42:05

## 2018-07-07 NOTE — Progress Notes (Signed)
BETHAN, ADAMEK ([id number]) Visit Report for 06/24/2018 Arrival Information Details Patient Name: [name], [name]. Date of Service: 06/24/2018 10:00 AM Medical Record Number: [id number] Patient Account Number: [id number] Date of Birth/Sex: 1926-06-22 (84 y.o. F) Treating RN: Renne Crigler Primary Care Yosgart Pavey: Einar Crow Other Clinician: Referring Nekeshia Lenhardt: Einar Crow Treating Thi Klich/Extender: Kathreen Cosier in Treatment: 7 Visit Information History Since Last Visit All ordered tests and consults were completed: No Patient Arrived: Wheel Chair Added or deleted any medications: No Arrival Time: 10:24 Any new allergies or adverse reactions: No Transfer Assistance: EasyPivot Patient Lift Had a fall or experienced change in No activities of daily living that may affect Patient Identification Verified: Yes risk of falls: Secondary Verification Process Yes Signs or symptoms of abuse/neglect since last visito No Completed: Hospitalized since last visit: No Implantable device outside of the clinic excluding No cellular tissue based products placed in the center since last visit: Pain Present Now: No Electronic Signature(s) Signed: 06/25/2018 4:54:12 PM By: Renne Crigler Entered By: Renne Crigler on 06/24/2018 10:24:41 Saric, Donnalyn Rexene Edison ([id number]) -------------------------------------------------------------------------------- Lower Extremity Assessment Details Patient Name: [name]. Date of Service: 06/24/2018 10:00 AM Medical Record Number: [id number] Patient Account Number: [id number] Date of Birth/Sex: 07-06-1926 (84 y.o. F) Treating RN: Renne Crigler Primary Care Ambriel Gorelick: Einar Crow Other Clinician: Referring Analucia Hush: Einar Crow Treating Valynn Schamberger/Extender: Kathreen Cosier in Treatment: 7 Edema Assessment Assessed: [Left: No] [Right: No] Edema: [Left: Yes] [Right: Yes] Vascular  Assessment Claudication: Claudication Assessment [Left:None] [Right:None] Pulses: Dorsalis Pedis Palpable: [Left:Yes] [Right:Yes] Posterior Tibial Extremity colors, hair growth, and conditions: Extremity Color: [Left:Normal] [Right:Normal] Hair Growth on Extremity: [Left:No] [Right:No] Temperature of Extremity: [Left:Warm] [Right:Warm] Capillary Refill: [Left:< 3 seconds] [Right:< 3 seconds] Toe Nail Assessment Left: Right: Thick: Yes Yes Discolored: Yes Yes Deformed: No No Improper Length and Hygiene: Yes Electronic Signature(s) Signed: 06/25/2018 4:54:12 PM By: Renne Crigler Entered By: Renne Crigler on 06/24/2018 10:36:15 Sypher, Hildagarde Rexene Edison ([id number]) -------------------------------------------------------------------------------- Multi Wound Chart Details Patient Name: [name]. Date of Service: 06/24/2018 10:00 AM Medical Record Number: [id number] Patient Account Number: [id number] Date of Birth/Sex: 25-Aug-1926 (84 y.o. F) Treating RN: Phillis Haggis Primary Care Evan Osburn: Einar Crow Other Clinician: Referring Suheily Birks: Einar Crow Treating Lacretia Tindall/Extender: Kathreen Cosier in Treatment: 7 Vital Signs Height(in): 62 Pulse(bpm): 70 Weight(lbs): Blood Pressure(mmHg): 164/61 Body Mass Index(BMI): Temperature(F): 97.8 Respiratory Rate 18 (breaths/min): Photos: [1:No Photos] [2:No Photos] [N/A:N/A] Wound Location: [1:Right Lower Leg - Posterior] [2:Left Calcaneus] [N/A:N/A] Wounding Event: [1:Pressure Injury] [2:Pressure Injury] [N/A:N/A] Primary Etiology: [1:Pressure Ulcer] [2:Pressure Ulcer] [N/A:N/A] Comorbid History: [1:Cataracts, Lymphedema, Hypertension, Rheumatoid Arthritis] [2:Cataracts, Lymphedema, Hypertension, Rheumatoid Arthritis] [N/A:N/A] Date Acquired: [1:02/08/2018] [2:04/10/2018] [N/A:N/A] Weeks of Treatment: [1:7] [2:7] [N/A:N/A] Wound Status: [1:Open] [2:Open] [N/A:N/A] Measurements L x W x D [1:4x1x0.2]  [2:0.4x0.2x0.1] [N/A:N/A] (cm) Area (cm) : [1:3.142] [2:0.063] [N/A:N/A] Volume (cm) : [1:0.628] [2:0.006] [N/A:N/A] % Reduction in Area: [1:-14.30%] [2:59.90%] [N/A:N/A] % Reduction in Volume: [1:-14.20%] [2:80.60%] [N/A:N/A] Classification: [1:Category/Stage III] [2:Category/Stage III] [N/A:N/A] Exudate Amount: [1:Large] [2:Small] [N/A:N/A] Exudate Type: [1:Serous] [2:Serous] [N/A:N/A] Exudate Color: [1:amber] [2:amber] [N/A:N/A] Wound Margin: [1:Flat and Intact] [2:Flat and Intact] [N/A:N/A] Granulation Amount: [1:None Present (0%)] [2:Small (1-33%)] [N/A:N/A] Granulation Quality: [1:N/A] [2:Red, Pink] [N/A:N/A] Necrotic Amount: [1:Large (67-100%)] [2:Large (67-100%)] [N/A:N/A] Necrotic Tissue: [1:Eschar, Adherent Slough] [2:Adherent Slough] [N/A:N/A] Exposed Structures: [1:Fat Layer (Subcutaneous Tissue) Exposed: Yes Fascia: No Tendon: No Muscle: No Joint: No Bone: No] [2:Fat Layer (Subcutaneous Tissue) Exposed: Yes Fascia: No Tendon: No Muscle: No Joint: No Bone: No] [N/A:N/A] Epithelialization: [1:Small (1-33%)] [2:Small (  1-33%)] [N/A:N/A] Periwound Skin Texture: [1:Excoriation: No Induration: No Callus: No Crepitus: No] [2:Induration: Yes Callus: Yes Excoriation: No Crepitus: No] [N/A:N/A] Rash: No Rash: No Scarring: No Scarring: No Periwound Skin Moisture: Maceration: No Maceration: No N/A Dry/Scaly: No Dry/Scaly: No Periwound Skin Color: Atrophie Blanche: No Atrophie Blanche: No N/A Cyanosis: No Cyanosis: No Ecchymosis: No Ecchymosis: No Erythema: No Erythema: No Hemosiderin Staining: No Hemosiderin Staining: No Mottled: No Mottled: No Pallor: No Pallor: No Rubor: No Rubor: No Temperature: No Abnormality No Abnormality N/A Tenderness on Palpation: Yes Yes N/A Wound Preparation: Ulcer Cleansing: Ulcer Cleansing: N/A Rinsed/Irrigated with Saline Rinsed/Irrigated with Saline Topical Anesthetic Applied: Topical Anesthetic Applied: Other: lidocaine  4% Other: lidocaine 4% Treatment Notes Electronic Signature(s) Signed: 06/28/2018 4:59:33 PM By: Alejandro Mulling Entered By: Alejandro Mulling on 06/24/2018 10:54:48 Richins, Carlene Coria ([id number]) -------------------------------------------------------------------------------- Multi-Disciplinary Care Plan Details Patient Name: [name]. Date of Service: 06/24/2018 10:00 AM Medical Record Number: [id number] Patient Account Number: [id number] Date of Birth/Sex: 12/11/1925 (84 y.o. F) Treating RN: Phillis Haggis Primary Care Breydan Shillingburg: Einar Crow Other Clinician: Referring Darin Redmann: Einar Crow Treating Govind Furey/Extender: Kathreen Cosier in Treatment: 7 Active Inactive ` Abuse / Safety / Falls / Self Care Management Nursing Diagnoses: Impaired physical mobility Goals: Patient will not experience any injury related to falls Date Initiated: 04/30/2018 Target Resolution Date: 05/29/2018 Goal Status: Active Interventions: Assess fall risk on admission and as needed Notes: ` Nutrition Nursing Diagnoses: Potential for alteratiion in Nutrition/Potential for imbalanced nutrition Goals: Patient/caregiver agrees to and verbalizes understanding of need to use nutritional supplements and/or vitamins as prescribed Date Initiated: 04/30/2018 Target Resolution Date: 07/16/2018 Goal Status: Active Interventions: Assess patient nutrition upon admission and as needed per policy Notes: ` Orientation to the Wound Care Program Nursing Diagnoses: Knowledge deficit related to the wound healing center program Goals: Patient/caregiver will verbalize understanding of the Wound Healing Center Program Date Initiated: 04/30/2018 Target Resolution Date: 07/30/2018 Goal Status: Active Interventions: DMIYAH, MARTONE ([id number]) Provide education on orientation to the wound center Notes: ` Wound/Skin Impairment Nursing Diagnoses: Impaired tissue  integrity Goals: Ulcer/skin breakdown will heal within 14 weeks Date Initiated: 04/30/2018 Target Resolution Date: 07/30/2018 Goal Status: Active Interventions: Assess patient/caregiver ability to obtain necessary supplies Assess patient/caregiver ability to perform ulcer/skin care regimen upon admission and as needed Assess ulceration(s) every visit Notes: Electronic Signature(s) Signed: 06/28/2018 4:59:33 PM By: Alejandro Mulling Entered By: Alejandro Mulling on 06/24/2018 10:54:23 Bachmeier, Verdie Rexene Edison ([id number]) -------------------------------------------------------------------------------- Pain Assessment Details Patient Name: [name]. Date of Service: 06/24/2018 10:00 AM Medical Record Number: [id number] Patient Account Number: [id number] Date of Birth/Sex: 12-27-1925 (84 y.o. F) Treating RN: Renne Crigler Primary Care Kamariyah Timberlake: Einar Crow Other Clinician: Referring Tylasia Fletchall: Einar Crow Treating Keary Hanak/Extender: Kathreen Cosier in Treatment: 7 Active Problems Location of Pain Severity and Description of Pain Patient Has Paino No Site Locations Pain Management and Medication Current Pain Management: Electronic Signature(s) Signed: 06/25/2018 4:54:12 PM By: Renne Crigler Entered By: Renne Crigler on 06/24/2018 10:24:50 Osso, Aliviya [name]. ([id number]) -------------------------------------------------------------------------------- Wound Assessment Details Patient Name: Watson, Kearia [name]. Date of Service: 06/24/2018 10:00 AM Medical Record Number: [id number] Patient Account Number: [id number] Date of Birth/Sex: 17-Oct-1926 (84 y.o. F) Treating RN: Renne Crigler Primary Care Shandiin Eisenbeis: Einar Crow Other Clinician: Referring Erdem Naas: Einar Crow Treating Graeson Nouri/Extender: Kathreen Cosier in Treatment: 7 Wound Status Wound Number: 1 Primary Pressure Ulcer Etiology: Wound Location: Right Lower Leg - Posterior Wound  Status: Open Wounding Event: Pressure Injury Comorbid Cataracts, Lymphedema, Hypertension, Date Acquired: 02/08/2018  History: Rheumatoid Arthritis Weeks Of Treatment: 7 Clustered Wound: No Photos Photo Uploaded By: Renne Crigler on 06/25/2018 12:48:20 Wound Measurements Length: (cm) 4 Width: (cm) 1 Depth: (cm) 0.2 Area: (cm) 3.142 Volume: (cm) 0.628 % Reduction in Area: -14.3% % Reduction in Volume: -14.2% Epithelialization: Small (1-33%) Tunneling: No Undermining: No Wound Description Classification: Category/Stage IV Wound Margin: Flat and Intact Exudate Amount: Large Exudate Type: Serous Exudate Color: amber Foul Odor After Cleansing: No Slough/Fibrino Yes Wound Bed Granulation Amount: None Present (0%) Exposed Structure Necrotic Amount: Large (67-100%) Fascia Exposed: No Necrotic Quality: Eschar, Adherent Slough Fat Layer (Subcutaneous Tissue) Exposed: Yes Tendon Exposed: Yes Muscle Exposed: No Joint Exposed: No Bone Exposed: No Periwound Skin Texture Wyeth, Rhylie [name]. ([id number]) Texture Color No Abnormalities Noted: No No Abnormalities Noted: No Callus: No Atrophie Blanche: No Crepitus: No Cyanosis: No Excoriation: No Ecchymosis: No Induration: No Erythema: No Rash: No Hemosiderin Staining: No Scarring: No Mottled: No Pallor: No Moisture Rubor: No No Abnormalities Noted: No Dry / Scaly: No Temperature / Pain Maceration: No Temperature: No Abnormality Tenderness on Palpation: Yes Wound Preparation Ulcer Cleansing: Rinsed/Irrigated with Saline Topical Anesthetic Applied: Other: lidocaine 4%, Electronic Signature(s) Signed: 06/24/2018 12:40:25 PM By: Bonnell Public Signed: 06/25/2018 4:54:12 PM By: Renne Crigler Entered By: Bonnell Public on 06/24/2018 12:40:24 ELNA, RADOVICH ([id number]) -------------------------------------------------------------------------------- Wound Assessment Details Patient Name: Seto, Felicia  [name]. Date of Service: 06/24/2018 10:00 AM Medical Record Number: [id number] Patient Account Number: [id number] Date of Birth/Sex: 11-03-1926 (84 y.o. F) Treating RN: Renne Crigler Primary Care Javona Bergevin: Einar Crow Other Clinician: Referring Charon Akamine: Einar Crow Treating Dovber Ernest/Extender: Kathreen Cosier in Treatment: 7 Wound Status Wound Number: 2 Primary Pressure Ulcer Etiology: Wound Location: Left Calcaneus Wound Status: Open Wounding Event: Pressure Injury Comorbid Cataracts, Lymphedema, Hypertension, Date Acquired: 04/10/2018 History: Rheumatoid Arthritis Weeks Of Treatment: 7 Clustered Wound: No Photos Photo Uploaded By: Renne Crigler on 06/25/2018 12:48:21 Wound Measurements Length: (cm) 0.4 Width: (cm) 0.2 Depth: (cm) 0.1 Area: (cm) 0.063 Volume: (cm) 0.006 % Reduction in Area: 59.9% % Reduction in Volume: 80.6% Epithelialization: Small (1-33%) Tunneling: No Undermining: No Wound Description Classification: Category/Stage III Wound Margin: Flat and Intact Exudate Amount: Small Exudate Type: Serous Exudate Color: amber Foul Odor After Cleansing: No Slough/Fibrino Yes Wound Bed Granulation Amount: Small (1-33%) Exposed Structure Granulation Quality: Red, Pink Fascia Exposed: No Necrotic Amount: Large (67-100%) Fat Layer (Subcutaneous Tissue) Exposed: Yes Necrotic Quality: Adherent Slough Tendon Exposed: No Muscle Exposed: No Joint Exposed: No Bone Exposed: No Periwound Skin Texture [name], Meaghen [name]. ([id number]) Texture Color No Abnormalities Noted: No No Abnormalities Noted: No Callus: Yes Atrophie Blanche: No Crepitus: No Cyanosis: No Excoriation: No Ecchymosis: No Induration: Yes Erythema: No Rash: No Hemosiderin Staining: No Scarring: No Mottled: No Pallor: No Moisture Rubor: No No Abnormalities Noted: No Dry / Scaly: No Temperature / Pain Maceration: No Temperature: No Abnormality Tenderness on  Palpation: Yes Wound Preparation Ulcer Cleansing: Rinsed/Irrigated with Saline Topical Anesthetic Applied: Other: lidocaine 4%, Electronic Signature(s) Signed: 06/25/2018 4:54:12 PM By: Renne Crigler Entered By: Renne Crigler on 06/24/2018 10:37:31 Erhard, Adaora Rexene Edison ([id number]) -------------------------------------------------------------------------------- Vitals Details Patient Name: [name]. Date of Service: 06/24/2018 10:00 AM Medical Record Number: [id number] Patient Account Number: [id number] Date of Birth/Sex: 1926/07/02 (84 y.o. F) Treating RN: Renne Crigler Primary Care Adajah Cocking: Einar Crow Other Clinician: Referring Jendayi Berling: Einar Crow Treating Remee Charley/Extender: Kathreen Cosier in Treatment: 7 Vital Signs Time Taken: 10:25 Temperature (F): 97.8 Height (in): 62 Pulse (bpm): 70 Respiratory Rate (breaths/min): 18 Blood  Pressure (mmHg): 164/61 Reference Range: 80 - 120 mg / dl Electronic Signature(s) Signed: 06/25/2018 4:54:12 PM By: Renne Crigler Entered By: Renne Crigler on 06/24/2018 10:25:09

## 2018-07-09 ENCOUNTER — Encounter: Payer: Medicare Other | Admitting: Physician Assistant

## 2018-07-09 DIAGNOSIS — L89623 Pressure ulcer of left heel, stage 3: Secondary | ICD-10-CM | POA: Diagnosis not present

## 2018-07-14 ENCOUNTER — Non-Acute Institutional Stay (SKILLED_NURSING_FACILITY): Payer: Medicare Other | Admitting: Internal Medicine

## 2018-07-14 ENCOUNTER — Encounter: Payer: Self-pay | Admitting: Internal Medicine

## 2018-07-14 DIAGNOSIS — M779 Enthesopathy, unspecified: Secondary | ICD-10-CM

## 2018-07-14 DIAGNOSIS — T85898A Other specified complication of other internal prosthetic devices, implants and grafts, initial encounter: Secondary | ICD-10-CM | POA: Diagnosis not present

## 2018-07-14 DIAGNOSIS — L8990 Pressure ulcer of unspecified site, unspecified stage: Secondary | ICD-10-CM | POA: Diagnosis not present

## 2018-07-14 NOTE — Progress Notes (Signed)
Location:  [facility] Room Number: 301B Place of Service:  SNF [phone]) Provider:  Dr. Lacretia Nicks, MD  Patient Care Team: Lauro Regulus, MD as PCP - General (Internal Medicine)  Extended Emergency Contact Information Primary Emergency Contact: Blair Heys Home Phone: [phone] Mobile Phone: [phone] Relation: Sister Secondary Emergency Contact: Luanne Bras Home Phone: [phone] Mobile Phone: [phone] Relation: Relative  Code Status:  DNR Goals of care: Advanced Directive information Advanced Directives 07/14/2018  Does Patient Have a Medical Advance Directive? Yes  Type of Advance Directive Out of facility DNR (pink MOST or yellow form)  Does patient want to make changes to medical advance directive? No - Patient declined  Would patient like information on creating a medical advance directive? -  Pre-existing out of facility DNR order (yellow form or pink MOST form) Yellow form placed in chart (order not valid for inpatient use)     Chief Complaint  Patient presents with  . Acute Visit    Lump under Right Knee     HPI:  Pt is a 84 y.o. female seen today for an acute visit for evaluation of Lump under her Right knee. Patient has h/o Hypertension, Pressure Ulcer, Closed fracture of Right Tibia fibula, and dementia She was c/o feeling bony protuberance below her Right knee. According to the Nurses she has had that since her admission in facility but now she is c/o Pain when she walks. Patient also has wound in both her LE. And she has been following with Wound care. She was c/o Pain in both her Legs due to these wounds. She did not have any other complains. No h/o Fall. And she is walking and putting weight on her legs.  Past Medical History:  Diagnosis Date  . Age-related osteoporosis with current pathol fx with routine healing 01/18/2018  . Anxiety   . Arthritis   . At risk for falls   .  Balance problems   . Benign hypertensive CKD 01/18/2018  . Cataract   . Chronic kidney disease   . Dementia arising in the senium and presenium 01/18/2018  . Depression   . Disease of thyroid gland   . Frail elderly   . Gout   . Hypertension   . Impaired mobility   . Memory loss   . Neuromuscular disorder (HCC)   . Primary open angle glaucoma 09/11/2015  . Vision problems   . Visual impairment    Past Surgical History:  Procedure Laterality Date  . PARTIAL HYSTERECTOMY      No Known Allergies  Outpatient Encounter Medications as of 07/14/2018  Medication Sig  . acetaminophen (TYLENOL) 500 MG tablet Take 1,000 mg by mouth 3 (three) times daily.   Marland Kitchen allopurinol (ZYLOPRIM) 100 MG tablet Take 100 mg by mouth daily.   . Amino Acids-Protein Hydrolys (FEEDING SUPPLEMENT, PRO-STAT SUGAR FREE 64,) LIQD Take 30 mLs by mouth 2 (two) times daily between meals.  Marland Kitchen aspirin EC 81 MG tablet Take 81 mg by mouth daily.  . Cholecalciferol 4000 units CAPS Take 4,000 Units by mouth daily.  . collagenase (SANTYL) ointment cleanse left heel wound with ns, apply santyl nickel thick to slough, cover with ns gauze and Allevyn dressing and also Cleanse right leg wound with ns, skin prep periwound, apply santyl to wound bed nickel thick, cover with ns gauze and foam dressing.  . cyanocobalamin 1000 MCG tablet Take 1,000 mcg by mouth daily.  Thornell [employer]ius Care Products (  DERMACLOUD EX) Apply liberal amount topically to area of skin irritation prn. OK to leave at bedside.  . latanoprost (XALATAN) 0.005 % ophthalmic solution Place 1 drop into both eyes at bedtime.  Marland Kitchen losartan (COZAAR) 25 MG tablet Take 25 mg by mouth daily.  . magnesium hydroxide (MILK OF MAGNESIA) 400 MG/5ML suspension Take 30 mLs by mouth every 4 (four) hours as needed. Constipation/ no BM for 2 days  . methocarbamol (ROBAXIN) 500 MG tablet Take 500 mg by mouth every 8 (eight) hours as needed for muscle spasms.  . metoprolol tartrate (LOPRESSOR)  50 MG tablet Take 1 tablet (50 mg total) by mouth 2 (two) times daily.  . mirtazapine (REMERON) 15 MG tablet Take 15 mg by mouth at bedtime.   . Nutritional Supplements (ENSURE ENLIVE PO) Take 1 Bottle by mouth 2 (two) times daily between meals.  Marland Kitchen oxyCODONE (OXY IR/ROXICODONE) 5 MG immediate release tablet Take 1 tablet (5 mg total) by mouth every 4 (four) hours as needed.  Marland Kitchen oxyCODONE (OXY IR/ROXICODONE) 5 MG immediate release tablet Take 1 tablet (5 mg total) by mouth 2 (two) times daily.  . polyethylene glycol (MIRALAX / GLYCOLAX) packet Take 17 g by mouth daily as needed. Mix with 4-8 ounces fluid-- for constipation   . sennosides-docusate sodium (SENOKOT-S) 8.6-50 MG tablet Take 2 tablets by mouth 2 (two) times daily.  . Skin Protectants, Misc. (NO-STING SKIN-PREP EX) Apply topically. Apply liberal amount to the tips of the toes and B-heels BID for skin protection  . zinc sulfate 220 (50 Zn) MG capsule Take 220 mg by mouth daily.   No facility-administered encounter medications on file as of 07/14/2018.     Review of Systems  Review of Systems  Constitutional: Negative for activity change, appetite change, chills, diaphoresis, fatigue and fever.  HENT: Negative for mouth sores, postnasal drip, rhinorrhea, sinus pain and sore throat.   Respiratory: Negative for apnea, cough, chest tightness, shortness of breath and wheezing.   Cardiovascular: Negative for chest pain, palpitations and leg swelling.  Gastrointestinal: Negative for abdominal distention, abdominal pain, constipation, diarrhea, nausea and vomiting.  Genitourinary: Negative for dysuria and frequency.  Musculoskeletal: Negative for arthralgias, joint swelling and myalgias.  Skin: Negative for rash.  Neurological: Negative for dizziness, syncope, weakness, light-headedness and numbness.  Psychiatric/Behavioral: Negative for behavioral problems, confusion and sleep disturbance.     Immunization History  Administered Date(s)  Administered  . Influenza,inj,Quad PF,6+ Mos 09/11/2015, 10/08/2016  . Influenza,inj,quad, With Preservative 10/05/2017, 11/12/2017  . PPD Test 01/17/2018  . Pneumococcal Conjugate-13 10/08/2016  . Pneumococcal Polysaccharide-23 11/12/2017   Pertinent  Health Maintenance Due  Topic Date Due  . INFLUENZA VACCINE  06/24/2018  . DEXA SCAN  Completed  . PNA vac Low Risk Adult  Completed   No flowsheet data found. Functional Status Survey:    Vitals:   07/14/18 1338  BP: 115/60  Pulse: 92  Resp: 12  Temp: 98.4 F (36.9 C)  TempSrc: Oral  SpO2: 100%  Weight: 113 lb 3.2 oz (51.3 kg)  Height: 5\' 4"  (1.626 m)   Body mass index is 19.43 kg/m. Physical Exam  Constitutional: She appears well-developed.  HENT:  Head: Normocephalic.  Mouth/Throat: Oropharynx is clear and moist.  Eyes: Pupils are equal, round, and reactive to light.  Neck: Neck supple.  Cardiovascular: Normal rate and regular rhythm.  Pulmonary/Chest: Effort normal and breath sounds normal.  Abdominal: Soft. Bowel sounds are normal.  Musculoskeletal:  Mild edema Bilateral Has Bony Protuberance below  her Right Knee. It is not Painful.  Neurological: She is alert.  Skin: Skin is warm and dry.  Psychiatric: She has a normal mood and affect. Her behavior is normal. Thought content normal.    Labs reviewed: Recent Labs    01/17/18 0334 01/26/18 0530 06/16/18 0620  NA 142 144 144  K 4.1 3.9 3.5  CL 117* 113* 119*  CO2 22 22 22   GLUCOSE 113* 75 86  BUN 12 21* 38*  CREATININE 1.30* 1.36* 1.66*  CALCIUM 7.8* 8.5* 9.0  MG  --  1.5*  --    Recent Labs    01/15/18 0521 01/26/18 0530 06/16/18 0620  AST 59* 32 17  ALT 21 13* 7  ALKPHOS 62 138* 101  BILITOT 0.9 0.9 0.6  PROT 4.8* 5.6* 6.1*  ALBUMIN 2.6* 2.4* 3.1*   Recent Labs    01/14/18 0347 01/26/18 0530 06/16/18 0620  WBC 9.5 7.8 4.9  NEUTROABS  --  5.2 2.0  HGB 10.8* 10.1* 11.1*  HCT 32.8* 30.4* 33.2*  MCV 102.1* 103.3* 100.3*  PLT 211  311 226   Lab Results  Component Value Date   TSH 1.729 01/26/2018   No results found for: HGBA1C Lab Results  Component Value Date   CHOL 109 01/26/2018   HDL 38 (L) 01/26/2018   LDLCALC 53 01/26/2018   TRIG 90 01/26/2018   CHOLHDL 2.9 01/26/2018    Significant Diagnostic Results in last 30 days:  No results found.  Assessment/Plan ? Bony Spur in Right Knee I reviewed her last Xray and she had this protuberance before with Fracture just below it. Will repeat Xray to r/o any new fracture Her pain is related more to her Wounds  Pressure Wounds in Both Extremities Getting Santyl dressing  Follows with Wound care clinic Protein Calorie Malnutrition Patient is getting her dentures repaired. Ordered for 03/28/2018 staff Communication:   Labs/tests ordered:

## 2018-07-16 ENCOUNTER — Encounter: Payer: Self-pay | Admitting: Adult Health

## 2018-07-16 ENCOUNTER — Non-Acute Institutional Stay (SKILLED_NURSING_FACILITY): Payer: Medicare Other | Admitting: Adult Health

## 2018-07-16 DIAGNOSIS — R630 Anorexia: Secondary | ICD-10-CM

## 2018-07-16 DIAGNOSIS — M1A0710 Idiopathic chronic gout, right ankle and foot, without tophus (tophi): Secondary | ICD-10-CM | POA: Diagnosis not present

## 2018-07-16 DIAGNOSIS — E538 Deficiency of other specified B group vitamins: Secondary | ICD-10-CM

## 2018-07-16 DIAGNOSIS — I10 Essential (primary) hypertension: Secondary | ICD-10-CM

## 2018-07-16 DIAGNOSIS — G8929 Other chronic pain: Secondary | ICD-10-CM

## 2018-07-16 DIAGNOSIS — F0390 Unspecified dementia without behavioral disturbance: Secondary | ICD-10-CM

## 2018-07-16 NOTE — Progress Notes (Signed)
Location:  [facility] Room Number: 301B Place of Service:  SNF ([phone]) Provider:  Kenard Gower, NP  Patient Care Team: Lauro Regulus, MD as PCP - General (Internal Medicine)  Extended Emergency Contact Information Primary Emergency Contact: Blair Heys Home Phone: [phone] Mobile Phone: [phone] Relation: Sister Secondary Emergency Contact: Luanne Bras Home Phone: [phone] Mobile Phone: [phone] Relation: Relative  Code Status:  DNR  Goals of care: Advanced Directive information Advanced Directives 07/14/2018  Does Patient Have a Medical Advance Directive? Yes  Type of Advance Directive Out of facility DNR (pink MOST or yellow form)  Does patient want to make changes to medical advance directive? No - Patient declined  Would patient like information on creating a medical advance directive? -  Pre-existing out of facility DNR order (yellow form or pink MOST form) Yellow form placed in chart (order not valid for inpatient use)     Chief Complaint  Patient presents with  . Medical Management of Chronic Issues    Routine Visit    HPI:  Pt is a 84 y.o. female seen today for medical management of chronic diseases. She has PMH of osteoporosis, arthritis, gout, and dementia. She was seen in her room today. She was complaining of right knee pain. X-ray did not show any new fracture. She has a chronic ulcer on the lateral side of her right shin for which she follows up with wound clinic. Latest weight is 113.2 lbs Body mass index is 19.43 kg/m.  BPs are well-controlled - 115/60, 138/82, 140/76.   Past Medical History:  Diagnosis Date  . Age-related osteoporosis with current pathol fx with routine healing 01/18/2018  . Anxiety   . Arthritis   . At risk for falls   . Balance problems   . Benign hypertensive CKD 01/18/2018  . Cataract   . Chronic kidney disease   . Dementia arising in the senium and presenium  01/18/2018  . Depression   . Disease of thyroid gland   . Frail elderly   . Gout   . Hypertension   . Impaired mobility   . Memory loss   . Neuromuscular disorder (HCC)   . Primary open angle glaucoma 09/11/2015  . Vision problems   . Visual impairment    Past Surgical History:  Procedure Laterality Date  . PARTIAL HYSTERECTOMY      No Known Allergies  Outpatient Encounter Medications as of 07/16/2018  Medication Sig  . allopurinol (ZYLOPRIM) 100 MG tablet Take 100 mg by mouth daily.   Marland Kitchen aspirin EC 81 MG tablet Take 81 mg by mouth daily.  . cyanocobalamin 1000 MCG tablet Take 1,000 mcg by mouth daily.  . Infant Care Products [facility] EX) Apply liberal amount topically to area of skin irritation prn. OK to leave at bedside.  . latanoprost (XALATAN) 0.005 % ophthalmic solution Place 1 drop into both eyes at bedtime.  Marland Kitchen losartan (COZAAR) 25 MG tablet Take 25 mg by mouth daily.  . methocarbamol (ROBAXIN) 500 MG tablet Take 500 mg by mouth every 8 (eight) hours as needed for muscle spasms.  . metoprolol tartrate (LOPRESSOR) 50 MG tablet Take 1 tablet (50 mg total) by mouth 2 (two) times daily.  . Nutritional Supplements (ENSURE ENLIVE PO) Take 1 Bottle by mouth 2 (two) times daily between meals.  Marland Kitchen acetaminophen (TYLENOL) 500 MG tablet Take 1,000 mg by mouth 3 (three) times daily.   . Amino Acids-Protein Hydrolys (FEEDING SUPPLEMENT, PRO-STAT SUGAR FREE 64,) LIQD  Take 30 mLs by mouth 2 (two) times daily between meals.  . Cholecalciferol 4000 units CAPS Take 4,000 Units by mouth daily.  . collagenase (SANTYL) ointment cleanse left heel wound with ns, apply santyl nickel thick to slough, cover with ns gauze and Allevyn dressing and also Cleanse right leg wound with ns, skin prep periwound, apply santyl to wound bed nickel thick, cover with ns gauze and foam dressing.  . magnesium hydroxide (MILK OF MAGNESIA) 400 MG/5ML suspension Take 30 mLs by mouth every 4 (four) hours as needed.  Constipation/ no BM for 2 days  . mirtazapine (REMERON) 15 MG tablet Take 15 mg by mouth at bedtime.   Marland Kitchen oxyCODONE (OXY IR/ROXICODONE) 5 MG immediate release tablet Take 1 tablet (5 mg total) by mouth every 4 (four) hours as needed.  Marland Kitchen oxyCODONE (OXY IR/ROXICODONE) 5 MG immediate release tablet Take 1 tablet (5 mg total) by mouth 2 (two) times daily.  . polyethylene glycol (MIRALAX / GLYCOLAX) packet Take 17 g by mouth daily as needed. Mix with 4-8 ounces fluid-- for constipation   . sennosides-docusate sodium (SENOKOT-S) 8.6-50 MG tablet Take 2 tablets by mouth 2 (two) times daily.  . Skin Protectants, Misc. (NO-STING SKIN-PREP EX) Apply topically. Apply liberal amount to the tips of the toes and B-heels BID for skin protection  . zinc sulfate 220 (50 Zn) MG capsule Take 220 mg by mouth daily.   No facility-administered encounter medications on file as of 07/16/2018.     Review of Systems  GENERAL: No fever, chills or weakness MOUTH and THROAT: Denies oral discomfort, gingival pain or bleeding, pain from teeth or hoarseness   RESPIRATORY: no cough, SOB, DOE, wheezing, hemoptysis CARDIAC: No chest pain, edema or palpitations GI: No abdominal pain, diarrhea, constipation, heart burn, nausea or vomiting GU: Denies dysuria, frequency, hematuria, or discharge PSYCHIATRIC: Denies feelings of depression or anxiety. No report of hallucinations, insomnia, paranoia, or agitation    Immunization History  Administered Date(s) Administered  . Influenza,inj,Quad PF,6+ Mos 09/11/2015, 10/08/2016  . Influenza,inj,quad, With Preservative 10/05/2017, 11/12/2017  . PPD Test 01/17/2018  . Pneumococcal Conjugate-13 10/08/2016  . Pneumococcal Polysaccharide-23 11/12/2017   Pertinent  Health Maintenance Due  Topic Date Due  . INFLUENZA VACCINE  06/24/2018  . DEXA SCAN  Completed  . PNA vac Low Risk Adult  Completed      Vitals:   07/16/18 1253  BP: 115/60  Pulse: 80  Resp: 12  Temp: 98.4 F  (36.9 C)  TempSrc: Oral  SpO2: 100%  Weight: 113 lb 3.2 oz (51.3 kg)  Height: 5\' 4"  (1.626 m)   Body mass index is 19.43 kg/m.  Physical Exam  GENERAL APPEARANCE: In no acute distress.  SKIN:  Ulcer on right lower leg lateral shin, left heel has black scab  MOUTH and THROAT: Lips are without lesions. Oral mucosa is moist and without lesions. Tongue is normal in shape, size, and color and without lesions RESPIRATORY: Breathing is even & unlabored, BS CTAB CARDIAC: RRR, no murmur,no extra heart sounds, BLE 1+ edema GI: Abdomen soft, normal BS, no masses, no tenderness EXTREMITIES:  Able to move X 4 extremities NEUROLOGICAL: There is no tremor. Speech is clear PSYCHIATRIC: Alert to self, disoriented to time and place. Affect and behavior are appropriate   Labs reviewed: Recent Labs    01/17/18 0334 01/26/18 0530 06/16/18 0620  NA 142 144 144  K 4.1 3.9 3.5  CL 117* 113* 119*  CO2 22 22 22   GLUCOSE  113* 75 86  BUN 12 21* 38*  CREATININE 1.30* 1.36* 1.66*  CALCIUM 7.8* 8.5* 9.0  MG  --  1.5*  --    Recent Labs    01/15/18 0521 01/26/18 0530 06/16/18 0620  AST 59* 32 17  ALT 21 13* 7  ALKPHOS 62 138* 101  BILITOT 0.9 0.9 0.6  PROT 4.8* 5.6* 6.1*  ALBUMIN 2.6* 2.4* 3.1*   Recent Labs    01/14/18 0347 01/26/18 0530 06/16/18 0620  WBC 9.5 7.8 4.9  NEUTROABS  --  5.2 2.0  HGB 10.8* 10.1* 11.1*  HCT 32.8* 30.4* 33.2*  MCV 102.1* 103.3* 100.3*  PLT 211 311 226   Lab Results  Component Value Date   TSH 1.729 01/26/2018    Lab Results  Component Value Date   CHOL 109 01/26/2018   HDL 38 (L) 01/26/2018   LDLCALC 53 01/26/2018   TRIG 90 01/26/2018   CHOLHDL 2.9 01/26/2018     Assessment/Plan  1. Other chronic pain - x-ray of right knee did not show any fracture, will continue oxycodone 5 mg 1 tab BID   2. Chronic gout of right foot, unspecified cause - stable, continue allopurinol 100 mg 1 tab daily   3. Essential hypertension - ell-controlled,  continue metoprolol titrate 50 mg 1 tab twice a day   4. Poor appetite -  Weight 113.2 pounds BMI 19.43, wight is stable,continue mirtazapine 15 mg 1 tab daily at bedtime   5. Vitamin B 12 deficiency - continue vitamin B12 1000 g 1 tab daily   6. Dementia without behavioral disturbance, unspecified dementia type - continue supportive care, fall precautions    Family/ staff Communication: Discussed plan of care with resident.  Labs/tests ordered:  None  Goals of care:  Long-term care   Kenard Gower, NP [facility] [phone] (Monday-Friday 8:00 a.m. - 5:00 p.m.) [phone] (after hours)

## 2018-07-20 ENCOUNTER — Other Ambulatory Visit
Admission: RE | Admit: 2018-07-20 | Discharge: 2018-07-20 | Disposition: A | Discharge status: Home / Self Care | Payer: Medicare Other | Source: Ambulatory Visit | Attending: Internal Medicine | Admitting: Internal Medicine

## 2018-07-20 DIAGNOSIS — N184 Chronic kidney disease, stage 4 (severe): Secondary | ICD-10-CM | POA: Insufficient documentation

## 2018-07-20 LAB — BASIC METABOLIC PANEL
ANION GAP: 6 (ref 5–15)
BUN: 48 mg/dL — ABNORMAL HIGH (ref 8–23)
CALCIUM: 9.2 mg/dL (ref 8.9–10.3)
CO2: 22 mmol/L (ref 22–32)
CREATININE: 1.71 mg/dL — AB (ref 0.44–1.00)
Chloride: 116 mmol/L — ABNORMAL HIGH (ref 98–111)
GFR, EST AFRICAN AMERICAN: 29 mL/min — AB (ref >60)
GFR, EST NON AFRICAN AMERICAN: 25 mL/min — AB (ref >60)
Glucose, Bld: 91 mg/dL (ref 70–99)
Potassium: 4.1 mmol/L (ref 3.5–5.1)
SODIUM: 144 mmol/L (ref 135–145)

## 2018-07-21 LAB — PARATHYROID HORMONE, INTACT (NO CA): PTH: 42 pg/mL (ref 15–65)

## 2018-07-22 ENCOUNTER — Encounter: Payer: Medicare Other | Admitting: Physician Assistant

## 2018-07-22 DIAGNOSIS — L89623 Pressure ulcer of left heel, stage 3: Secondary | ICD-10-CM | POA: Diagnosis not present

## 2018-07-22 NOTE — Progress Notes (Signed)
QUINESHIA, RUHL ([id number]) Visit Report for 07/09/2018 Chief Complaint Document Details Patient Name: [name], [name]. Date of Service: 07/09/2018 10:15 AM Medical Record Number: [id number] Patient Account Number: [id number] Date of Birth/Sex: 1926-04-02 (84 y.o. F) Treating RN: Curtis Sites Primary Care Provider: Einar Crow Other Clinician: Referring Provider: Einar Crow Treating Provider/Extender: Linwood Dibbles, Aleigha Gilani Weeks in Treatment: 10 Information Obtained from: Patient Chief Complaint She is here for multiple wounds Electronic Signature(s) Signed: 07/12/2018 8:05:21 AM By: Lenda Kelp PA-C Entered By: Lenda Kelp on 07/09/2018 10:12:51 Prosise, Etter Rexene Edison ([id number]) -------------------------------------------------------------------------------- Debridement Details Patient Name: [name]. Date of Service: 07/09/2018 10:15 AM Medical Record Number: [id number] Patient Account Number: [id number] Date of Birth/Sex: [birth year] (84 y.o. F) Treating RN: Curtis Sites Primary Care Provider: Einar Crow Other Clinician: Referring Provider: Einar Crow Treating Provider/Extender: Linwood Dibbles, Tanelle Lanzo Weeks in Treatment: 10 Debridement Performed for Wound #2 Left Calcaneus Assessment: Performed By: Physician STONE III, Kristian Hazzard E., PA-C Debridement Type: Debridement Pre-procedure Verification/Time Yes - 11:22 Out Taken: Start Time: 11:22 Pain Control: Lidocaine 4% Topical Solution Total Area Debrided (L x W): 0.1 (cm) x 0.1 (cm) = 0.01 (cm) Tissue and other material Viable, Non-Viable, Callus, Slough, Subcutaneous, Slough debrided: Level: Skin/Subcutaneous Tissue Debridement Description: Excisional Instrument: Curette Bleeding: Minimum Hemostasis Achieved: Pressure End Time: 11:27 Procedural Pain: 0 Post Procedural Pain: 0 Response to Treatment: Procedure was tolerated well Level of Consciousness: Awake and Alert Post  Debridement Measurements of Total Wound Length: (cm) 0.2 Stage: Category/Stage III Width: (cm) 0.4 Depth: (cm) 0.5 Volume: (cm) 0.031 Character of Wound/Ulcer Post Improved Debridement: Post Procedure Diagnosis Same as Pre-procedure Electronic Signature(s) Signed: 07/09/2018 5:05:24 PM By: Curtis Sites Signed: 07/12/2018 8:05:21 AM By: Lenda Kelp PA-C Entered By: Curtis Sites on 07/09/2018 11:27:48 Teti, Leisa H. ([id number]) -------------------------------------------------------------------------------- Debridement Details Patient Name: [name]. Date of Service: 07/09/2018 10:15 AM Medical Record Number: [id number] Patient Account Number: [id number] Date of Birth/Sex: March 07, 1926 (84 y.o. F) Treating RN: Curtis Sites Primary Care Provider: Einar Crow Other Clinician: Referring Provider: Einar Crow Treating Provider/Extender: Linwood Dibbles, Aleksis Jiggetts Weeks in Treatment: 10 Debridement Performed for Wound #1 Right,Posterior Lower Leg Assessment: Performed By: Physician STONE III, Dauna Ziska E., PA-C Debridement Type: Debridement Pre-procedure Verification/Time Yes - 11:27 Out Taken: Start Time: 11:27 Pain Control: Lidocaine 4% Topical Solution Total Area Debrided (L x W): 4.4 (cm) x 1.2 (cm) = 5.28 (cm) Tissue and other material Viable, Non-Viable, Slough, Subcutaneous, Slough debrided: Level: Skin/Subcutaneous Tissue Debridement Description: Excisional Instrument: Curette Bleeding: Moderate Hemostasis Achieved: Pressure End Time: 11:29 Procedural Pain: 0 Post Procedural Pain: 0 Response to Treatment: Procedure was tolerated well Level of Consciousness: Awake and Alert Post Debridement Measurements of Total Wound Length: (cm) 4.4 Stage: Category/Stage IV Width: (cm) 1.2 Depth: (cm) 0.5 Volume: (cm) 2.073 Character of Wound/Ulcer Post Improved Debridement: Post Procedure Diagnosis Same as Pre-procedure Electronic  Signature(s) Signed: 07/09/2018 5:05:24 PM By: Curtis Sites Signed: 07/12/2018 8:05:21 AM By: Lenda Kelp PA-C Entered By: Curtis Sites on 07/09/2018 11:28:58 [name], [name] ([id number]) -------------------------------------------------------------------------------- HPI Details Patient Name: [name]. Date of Service: 07/09/2018 10:15 AM Medical Record Number: [id number] Patient Account Number: [id number] Date of Birth/Sex: 21-Nov-1926 (84 y.o. F) Treating RN: Curtis Sites Primary Care Provider: Einar Crow Other Clinician: Referring Provider: Einar Crow Treating Provider/Extender: Linwood Dibbles, Hurbert Duran Weeks in Treatment: 10 History of Present Illness HPI Description: 04/30/18- She is here for initial evaluation of right posterior calf and left heel pressure ulcers. She has a documented  history of dementia but presents today alert and oriented o3, confused with time frame/dates pertaining to recent injury, hospitalization and placementotherwise she appears appropriate to make decisions at today's visit. On 01/13/18 she was admitted to [facility] after a fall, she sustained a closed fracture to the proximal tib-fib on the right side. She was treated nonsurgically with a full-length leg immobilizer/brace and NWB status. She began to complain of pain to the posterior aspect of the right leg, on 5/21 she was noted to have a pressure injury, the patient states that she was exchanged to a shorter knee immobilizer on Tuesday 6/4. This is currently being treated with Santyl at the nursing home, she was transitioned from rehabilitation to long-term care at [facility] [place]. She also presents with a left heel pressure injury of unknown chronicity; there is no record of it in nursing home documentation. She had a blister noted to the left great toe, according to nursing home documentation, on 3/7. This now appears as a reabsorbing blood  blister/deep tissue injury and is not currently open. She denies wearing any heel open offloading boots for heel pressure relief. She has been advised to keep pressure off of left heel and right posterior leg at all times; offloading heel boots have been ordered for facility to provide. She is currently on Bactrim for culture (5/31) with trimeth/sulfa sensitive staph species (coag neg). She does admit to loose stools, 1-3 per day; will monitor next week for improvement and consider testing for c-diff. She is a DNR/Pallitive care per facility records. She does have reduced ABI in office today and we discussed the potential for delayed healing given this result, the risk for deterioration given the level and possible need for future testing/intervention. It is her decision that we treat conservatively at this time, not sending her for additional tests. 05/07/18 on evaluation today patient appears to be doing somewhat better I feel in regard to her ulcers on the left heel and the right lateral leg. She's been tolerating the dressing changes without complication with that being said she continues to have some discomfort she is also still recovering from her tib/fib fracture on the right. She has additional swelling of the right lower extremity secondary to this. No fevers, chills, nausea, or vomiting noted at this time. 05/18/18 on evaluation today patient's wound on the left heel actually appears to be doing better. Unfortunately the wound on the right lateral lower extremity actually appears to be about the same maybe even a little bit more bruising noted today. Unfortunately. With that being said I think the main issue is that she's getting pressure to the site when she actually is sitting in her wheelchair which she does for most of the day according what she tells me today. I do think that a Prevalon Boots may help to offload this to a degree and this is something that I did recommend for her today.  We have previously it looks like order bilateral Prevalon Boots but nonetheless she has one for the left not for the right she tells me. We were going to see if we can work on that. 06/04/18 on evaluation today patient actually appears to be doing better in regard to left heel ulcer that was actually some dead skin surrounding it appeared to be somewhat macerated I think she may tolerate some light debridement here in order to allow for a different dressing possibly Prisma to be utilized. In regard to the right lateral lower extremity  I think that Santyl will likely still be the most appropriate dressing. 06/17/18-She is seen in follow-up evaluation for right posterior calf and left posterior heel ulcer. 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 She continues to complain of significant pain to the left posterior heel. She is tolerating dressing changes which we will continue 06/24/18-she is seen in follow-up evaluation for right posterior calf and left posterior heel ulcer. The right posterior calf ulcer is stable/improved with any nonviable tissue, tendon remains exposed. The left posterior heel is improved with pain. She admits to compliance with wearing offloading boots at night while in bed. We will continue with same treatment plan she can follow- up in 2 weeks 07/09/18 on evaluation today patient appears to be doing decently well in regard to her wounds in my pinion. She has been tolerating the dressing changes as well as debridement with only minimal discomfort. Her right lateral lower Trinity seems to [place], SUDA FORBESS. ([id number]) be the worst areas for debridement is concerned. Fortunately there's no evidence of infection at this point in time. Her heel still has some depth to it were using Prisma at this point. Electronic Signature(s) Signed: 07/12/2018 8:05:21 AM By: Lenda Kelp PA-C Entered By: Lenda Kelp on 07/09/2018 11:33:14 Bleich, Carlene Coria  ([id number]) -------------------------------------------------------------------------------- Physical Exam Details Patient Name: Cabriales, Saesha H. Date of Service: 07/09/2018 10:15 AM Medical Record Number: [id number] Patient Account Number: [id number] Date of Birth/Sex: 1926/10/15 (84 y.o. F) Treating RN: Curtis Sites Primary Care Provider: Einar Crow Other Clinician: Referring Provider: Einar Crow Treating Provider/Extender: STONE III, Shamir Sedlar Weeks in Treatment: 10 Constitutional Well-nourished and well-hydrated in no acute distress. Respiratory normal breathing without difficulty. Psychiatric this patient is able to make decisions and demonstrates good insight into disease process. Alert and Oriented x 3. pleasant and cooperative. Notes Patient's wounds did require sharp debridement today and this was performed without complication post debridement the wound beds did appear to be doing better which is good news. Overall I'm pleased with how things are progressing. Nonetheless I still believe that she is benefiting from the debridement and I explained how this works I'm gonna continue the sample for the right lateral lower extremity I think this is definitely of benefit for her as well. Electronic Signature(s) Signed: 07/12/2018 8:05:21 AM By: Lenda Kelp PA-C Entered By: Lenda Kelp on 07/09/2018 11:33:52 Aull, Carlene Coria ([id number]) -------------------------------------------------------------------------------- Physician Orders Details Patient Name: [name]. Date of Service: 07/09/2018 10:15 AM Medical Record Number: [id number] Patient Account Number: [id number] Date of Birth/Sex: 1926-10-02 (84 y.o. F) Treating RN: Curtis Sites Primary Care Provider: Einar Crow Other Clinician: Referring Provider: Einar Crow Treating Provider/Extender: Linwood Dibbles, Charleston Hankin Weeks in Treatment: 10 Verbal / Phone Orders: No Diagnosis  Coding ICD-10 Coding Code Description [phone] Pressure ulcer of left heel, stage 3 L89.94 Pressure ulcer of unspecified site, stage 4 I87.2 Venous insufficiency (chronic) (peripheral) N18.3 Chronic kidney disease, stage 3 (moderate) L08.9 Local infection of the skin and subcutaneous tissue, unspecified Wound Cleansing Wound #1 Right,Posterior Lower Leg o Clean wound with Normal Saline. o Cleanse wound with mild soap and water o May Shower, gently pat wound dry prior to applying new dressing. Wound #2 Left Calcaneus o Clean wound with Normal Saline. o Cleanse wound with mild soap and water o May Shower, gently pat wound dry prior to applying new dressing. Anesthetic (add to Medication List) Wound #1 Right,Posterior Lower Leg o Topical Lidocaine 4% cream applied to wound  bed prior to debridement (In Clinic Only). Wound #2 Left Calcaneus o Topical Lidocaine 4% cream applied to wound bed prior to debridement (In Clinic Only). Primary Wound Dressing Wound #1 Right,Posterior Lower Leg o Saline moistened gauze o Santyl Ointment Wound #2 Left Calcaneus o Silver Collagen - lightly moisten with saline Secondary Dressing Wound #1 Right,Posterior Lower Leg o Dry Gauze o Boardered Foam Dressing Wound #2 Left Calcaneus o Dry Gauze o Boardered Foam Dressing Tortorelli, Taniqua H. ([id number]) Dressing Change Frequency Wound #1 Right,Posterior Lower Leg o Change dressing every day. Wound #2 Left Calcaneus o Change dressing every day. Follow-up Appointments Wound #1 Right,Posterior Lower Leg o Return Appointment in 2 weeks. Wound #2 Left Calcaneus o Return Appointment in 2 weeks. Edema Control Wound #1 Right,Posterior Lower Leg o Elevate legs to the level of the heart and pump ankles as often as possible Wound #2 Left Calcaneus o Elevate legs to the level of the heart and pump ankles as often as possible Off-Loading Wound #1 Right,Posterior  Lower Leg o Other: - PLEASE PROVIDE PATIENT WITH BILATERAL PREVALON BOOTS FOR PATIENT TO WEAR AT ALL TIMES WHILE IN BED AND IN WHEELCHAIR Wound #2 Left Calcaneus o Other: - PLEASE PROVIDE PATIENT WITH BILATERAL PREVALON BOOTS FOR PATIENT TO WEAR AT ALL TIMES WHILE IN BED AND IN [facility] Additional Orders / Instructions Wound #1 Right,Posterior Lower Leg o Increase protein intake. o Activity as tolerated o Other: - Please add facility wound healing vitamins per facility protocol Wound #2 Left Calcaneus o Increase protein intake. o Activity as tolerated o Other: - Please add facility wound healing vitamins per facility protocol Medications-please add to medication list. Wound #1 Right,Posterior Lower Leg o Santyl Enzymatic Ointment Electronic Signature(s) Signed: 07/09/2018 5:05:24 PM By: Curtis Sites Signed: 07/12/2018 8:05:21 AM By: Lenda Kelp PA-C Entered By: Curtis Sites on 07/09/2018 11:31:07 Darrington, Elexia H. ([id number]) -------------------------------------------------------------------------------- Problem List Details Patient Name: Peach, Jakirah H. Date of Service: 07/09/2018 10:15 AM Medical Record Number: [id number] Patient Account Number: [id number] Date of Birth/Sex: 1926-07-19 (84 y.o. F) Treating RN: Curtis Sites Primary Care Provider: Einar Crow Other Clinician: Referring Provider: Einar Crow Treating Provider/Extender: Linwood Dibbles, Sheba Whaling Weeks in Treatment: 10 Active Problems ICD-10 Evaluated Encounter Code Description Active Date Today Diagnosis L89.623 Pressure ulcer of left heel, stage 3 04/30/2018 No Yes L89.94 Pressure ulcer of unspecified site, stage 4 04/30/2018 No Yes I87.2 Venous insufficiency (chronic) (peripheral) 04/30/2018 No Yes N18.3 Chronic kidney disease, stage 3 (moderate) 04/30/2018 No Yes L08.9 Local infection of the skin and subcutaneous tissue, 04/30/2018 No Yes unspecified Inactive  Problems Resolved Problems Electronic Signature(s) Signed: 07/12/2018 8:05:21 AM By: Lenda Kelp PA-C Entered By: Lenda Kelp on 07/09/2018 10:12:46 Piacente, Bria Rexene Edison ([id number]) -------------------------------------------------------------------------------- Progress Note Details Patient Name: [name]. Date of Service: 07/09/2018 10:15 AM Medical Record Number: [id number] Patient Account Number: [id number] Date of Birth/Sex: [birth year] (84 y.o. F) Treating RN: Curtis Sites Primary Care Provider: Einar Crow Other Clinician: Referring Provider: Einar Crow Treating Provider/Extender: Linwood Dibbles, Cheyann Blecha Weeks in Treatment: 10 Subjective Chief Complaint Information obtained from Patient She is here for multiple wounds History of Present Illness (HPI) 04/30/18- She is here for initial evaluation of right posterior calf and left heel pressure ulcers. She has a documented history of dementia but presents today alert and oriented o3, confused with time frame/dates pertaining to recent injury, hospitalization and placementotherwise she appears appropriate to make decisions at today's visit. On 01/13/18 she was admitted to [facility] after a fall, she sustained a closed fracture to the proximal tib-fib on the right side. She was treated nonsurgically with a full-length leg immobilizer/brace and NWB status. She began to complain of pain to the posterior aspect of the right leg, on 5/21 she was noted to have a pressure injury, the patient states that she was exchanged to a shorter knee immobilizer on Tuesday 6/4. This is currently being treated with Santyl at the nursing home, she was transitioned from rehabilitation to long-term care at [facility] at [place]. She also presents with a left heel pressure injury of unknown chronicity; there is no record of it in nursing home documentation. She had a blister noted to the left great toe, according  to nursing home documentation, on 3/7. This now appears as a reabsorbing blood blister/deep tissue injury and is not currently open. She denies wearing any heel open offloading boots for heel pressure relief. She has been advised to keep pressure off of left heel and right posterior leg at all times; offloading heel boots have been ordered for facility to provide. She is currently on Bactrim for culture (5/31) with trimeth/sulfa sensitive staph species (coag neg). She does admit to loose stools, 1-3 per day; will monitor next week for improvement and consider testing for c-diff. She is a DNR/Pallitive care per facility records. She does have reduced ABI in office today and we discussed the potential for delayed healing given this result, the risk for deterioration given the level and possible need for future testing/intervention. It is her decision that we treat conservatively at this time, not sending her for additional tests. 05/07/18 on evaluation today patient appears to be doing somewhat better I feel in regard to her ulcers on the left heel and the right lateral leg. She's been tolerating the dressing changes without complication with that being said she continues to have some discomfort she is also still recovering from her tib/fib fracture on the right. She has additional swelling of the right lower extremity secondary to this. No fevers, chills, nausea, or vomiting noted at this time. 05/18/18 on evaluation today patient's wound on the left heel actually appears to be doing better. Unfortunately the wound on the right lateral lower extremity actually appears to be about the same maybe even a little bit more bruising noted today. Unfortunately. With that being said I think the main issue is that she's getting pressure to the site when she actually is sitting in her wheelchair which she does for most of the day according what she tells me today. I do think that a Prevalon Boots may help to  offload this to a degree and this is something that I did recommend for her today. We have previously it looks like order bilateral Prevalon Boots but nonetheless she has one for the left not for the right she tells me. We were going to see if we can work on that. 06/04/18 on evaluation today patient actually appears to be doing better in regard to left heel ulcer that was actually some dead skin surrounding it appeared to be somewhat macerated I think she may tolerate some light debridement here in order to allow for a different dressing possibly Prisma to be utilized. In regard to the right lateral lower extremity I think that Santyl will likely still be the most appropriate dressing. 06/17/18-She is seen in follow-up evaluation for right posterior calf and left posterior heel ulcer. 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  She continues to complain of significant pain to the left posterior heel. She is tolerating dressing changes which we will continue 06/24/18-she is seen in follow-up evaluation for right posterior calf and left posterior heel ulcer. The right posterior calf ulcer is stable/improved with any nonviable tissue, tendon remains exposed. The left posterior heel is improved with pain. She admits CHIRSTY, ARMISTEAD. ([id number]) to compliance with wearing offloading boots at night while in bed. We will continue with same treatment plan she can follow- up in 2 weeks 07/09/18 on evaluation today patient appears to be doing decently well in regard to her wounds in my pinion. She has been tolerating the dressing changes as well as debridement with only minimal discomfort. Her right lateral lower Trinity seems to be the worst areas for debridement is concerned. Fortunately there's no evidence of infection at this point in time. Her heel still has some depth to it were using Prisma at this point. Patient History Information obtained from Patient. Family  History Diabetes - Father, Heart Disease - Father, Kidney Disease - Mother, No family history of Cancer, Hypertension, Lung Disease, Seizures, Stroke, Thyroid Problems, Tuberculosis. Social History Former smoker - 20 years stopped, Marital Status - Widowed, Alcohol Use - Rarely, Drug Use - No History, Caffeine Use - Daily. Review of Systems (ROS) Constitutional Symptoms (General Health) Denies complaints or symptoms of Fever, Chills. Respiratory The patient has no complaints or symptoms. Cardiovascular The patient has no complaints or symptoms. Psychiatric The patient has no complaints or symptoms. Objective Constitutional Well-nourished and well-hydrated in no acute distress. Vitals Time Taken: 10:40 AM, Height: 62 in, Temperature: 97.5 F, Pulse: 68 bpm, Respiratory Rate: 16 breaths/min, Blood Pressure: 139/59 mmHg. Respiratory normal breathing without difficulty. Psychiatric this patient is able to make decisions and demonstrates good insight into disease process. Alert and Oriented x 3. pleasant and cooperative. General Notes: Patient's wounds did require sharp debridement today and this was performed without complication post debridement the wound beds did appear to be doing better which is good news. Overall I'm pleased with how things are progressing. Nonetheless I still believe that she is benefiting from the debridement and I explained how this works I'm gonna continue the sample for the right lateral lower extremity I think this is definitely of benefit for her as well. YASMINA, CHICO ([id number]) Integumentary (Hair, Skin) Wound #1 status is Open. Original cause of wound was Pressure Injury. The wound is located on the Right,Posterior Lower Leg. The wound measures 4.4cm length x 1.2cm width x 0.3cm depth; 4.147cm^2 area and 1.244cm^3 volume. There is tendon and Fat Layer (Subcutaneous Tissue) Exposed exposed. There is no tunneling or undermining noted. There is a  large amount of serosanguineous drainage noted. The wound margin is flat and intact. There is small (1-33%) red granulation within the wound bed. There is a large (67-100%) amount of necrotic tissue within the wound bed including Eschar and Adherent Slough. The periwound skin appearance did not exhibit: Callus, Crepitus, Excoriation, Induration, Rash, Scarring, Dry/Scaly, Maceration, Atrophie Blanche, Cyanosis, Ecchymosis, Hemosiderin Staining, Mottled, Pallor, Rubor, Erythema. Periwound temperature was noted as No Abnormality. The periwound has tenderness on palpation. Wound #2 status is Open. Original cause of wound was Pressure Injury. The wound is located on the Left Calcaneus. The wound measures 0.1cm length x 0.1cm width x 0.1cm depth; 0.008cm^2 area and 0.001cm^3 volume. There is no tunneling or undermining noted. There is a small amount of serous drainage noted. The wound margin is flat and intact. There  is no granulation within the wound bed. There is no necrotic tissue within the wound bed. The periwound skin appearance had no abnormalities noted for texture. The periwound skin appearance did not exhibit: Dry/Scaly, Maceration, Atrophie Blanche, Cyanosis, Ecchymosis, Hemosiderin Staining, Mottled, Pallor, Rubor, Erythema. Periwound temperature was noted as No Abnormality. The periwound has tenderness on palpation. Assessment Active Problems ICD-10 Pressure ulcer of left heel, stage 3 Pressure ulcer of unspecified site, stage 4 Venous insufficiency (chronic) (peripheral) Chronic kidney disease, stage 3 (moderate) Local infection of the skin and subcutaneous tissue, unspecified Procedures Wound #1 Pre-procedure diagnosis of Wound #1 is a Pressure Ulcer located on the Right,Posterior Lower Leg . There was a Excisional Skin/Subcutaneous Tissue Debridement with a total area of 5.28 sq cm performed by STONE III, Hadiyah Maricle E., PA-C. With the following instrument(s): Curette to remove Viable  and Non-Viable tissue/material. Material removed includes Subcutaneous Tissue and Slough and after achieving pain control using Lidocaine 4% Topical Solution. No specimens were taken. A time out was conducted at 11:27, prior to the start of the procedure. A Moderate amount of bleeding was controlled with Pressure. The procedure was tolerated well with a pain level of 0 throughout and a pain level of 0 following the procedure. Patient s Level of Consciousness post procedure was recorded as Awake and Alert. Post Debridement Measurements: 4.4cm length x 1.2cm width x 0.5cm depth; 2.073cm^3 volume. Post debridement Stage noted as Category/Stage IV. Character of Wound/Ulcer Post Debridement is improved. Post procedure Diagnosis Wound #1: Same as Pre-Procedure Wound #2 Pre-procedure diagnosis of Wound #2 is a Pressure Ulcer located on the Left Calcaneus . There was a Excisional Skin/Subcutaneous Tissue Debridement with a total area of 0.01 sq cm performed by STONE III, Luba Matzen E., PA-C. With the following instrument(s): Curette to remove Viable and Non-Viable tissue/material. Material removed includes Callus, Subcutaneous Tissue, and Slough after achieving pain control using Lidocaine 4% Topical Solution. No specimens were Hufnagle, Ennis H. ([id number]) taken. A time out was conducted at 11:22, prior to the start of the procedure. A Minimum amount of bleeding was controlled with Pressure. The procedure was tolerated well with a pain level of 0 throughout and a pain level of 0 following the procedure. Patient s Level of Consciousness post procedure was recorded as Awake and Alert. Post Debridement Measurements: 0.2cm length x 0.4cm width x 0.5cm depth; 0.031cm^3 volume. Post debridement Stage noted as Category/Stage III. Character of Wound/Ulcer Post Debridement is improved. Post procedure Diagnosis Wound #2: Same as Pre-Procedure Plan Wound Cleansing: Wound #1 Right,Posterior Lower Leg: Clean wound  with Normal Saline. Cleanse wound with mild soap and water May Shower, gently pat wound dry prior to applying new dressing. Wound #2 Left Calcaneus: Clean wound with Normal Saline. Cleanse wound with mild soap and water May Shower, gently pat wound dry prior to applying new dressing. Anesthetic (add to Medication List): Wound #1 Right,Posterior Lower Leg: Topical Lidocaine 4% cream applied to wound bed prior to debridement (In Clinic Only). Wound #2 Left Calcaneus: Topical Lidocaine 4% cream applied to wound bed prior to debridement (In Clinic Only). Primary Wound Dressing: Wound #1 Right,Posterior Lower Leg: Saline moistened gauze Santyl Ointment Wound #2 Left Calcaneus: Silver Collagen - lightly moisten with saline Secondary Dressing: Wound #1 Right,Posterior Lower Leg: Dry Gauze Boardered Foam Dressing Wound #2 Left Calcaneus: Dry Gauze Boardered Foam Dressing Dressing Change Frequency: Wound #1 Right,Posterior Lower Leg: Change dressing every day. Wound #2 Left Calcaneus: Change dressing every day. Follow-up Appointments: Wound #1 Right,Posterior Lower  Leg: Return Appointment in 2 weeks. Wound #2 Left Calcaneus: Return Appointment in 2 weeks. Edema Control: Wound #1 Right,Posterior Lower Leg: Elevate legs to the level of the heart and pump ankles as often as possible Wound #2 Left Calcaneus: Elevate legs to the level of the heart and pump ankles as often as possible Off-Loading: Wound #1 Right,Posterior Lower Leg: Paro, Conchita H. ([id number]) Other: - PLEASE PROVIDE PATIENT WITH BILATERAL PREVALON BOOTS FOR PATIENT TO WEAR AT ALL TIMES WHILE IN BED AND IN WHEELCHAIR Wound #2 Left Calcaneus: Other: - PLEASE PROVIDE PATIENT WITH BILATERAL PREVALON BOOTS FOR PATIENT TO WEAR AT ALL TIMES WHILE IN BED AND IN [facility] Additional Orders / Instructions: Wound #1 Right,Posterior Lower Leg: Increase protein intake. Activity as tolerated Other: - Please add facility  wound healing vitamins per facility protocol Wound #2 Left Calcaneus: Increase protein intake. Activity as tolerated Other: - Please add facility wound healing vitamins per facility protocol Medications-please add to medication list.: Wound #1 Right,Posterior Lower Leg: Santyl Enzymatic Ointment The only concern I have at this point is that she mentioned that somebody at the facility was "debriding her wounds" according to what they were telling her. I'm not sure exactly what's going on in this regard with its physical therapy and post lavage or something else occurring although she states that they actually inject the area around the wound to numb it before proceeding. Again we need to find out exactly what's going on if she's seeing a wound care specialist in the facility then there's really not a purpose for her to be coming both here and seeing someone there nor is it really appropriate from an insurance perspective. Verlon Au gonna work on figuring this out. She's in agreement that plan. If anything changes she'll let us know otherwise will see her back for reevaluation in two weeks time. Please see above for specific wound care orders. We will see patient for re-evaluation in 1 week(s) here in the clinic. If anything worsens or changes patient will contact our office for additional recommendations. Electronic Signature(s) Signed: 07/12/2018 8:05:21 AM By: Lenda Kelp PA-C Entered By: Lenda Kelp on 07/09/2018 11:34:56 Hey, Carlene Coria ([id number]) -------------------------------------------------------------------------------- ROS/PFSH Details Patient Name: [name]. Date of Service: 07/09/2018 10:15 AM Medical Record Number: [id number] Patient Account Number: [id number] Date of Birth/Sex: November 26, 1925 (84 y.o. F) Treating RN: Curtis Sites Primary Care Provider: Einar Crow Other Clinician: Referring Provider: Einar Crow Treating Provider/Extender:  Linwood Dibbles, Ellesse Antenucci Weeks in Treatment: 10 Information Obtained From Patient Wound History Do you currently have one or more open woundso Yes How many open wounds do you currently haveo 2 Approximately how long have you had your woundso 2 weeks How have you been treating your wound(s) until nowo 2 weeks Has your wound(s) ever healed and then re-openedo No Have you had any lab work done in the past montho No Have you tested positive for an antibiotic resistant organism (MRSA, VRE)o No Have you tested positive for osteomyelitis (bone infection)o No Have you had any tests for circulation on your legso No Constitutional Symptoms (General Health) Complaints and Symptoms: Negative for: Fever; Chills Eyes Medical History: Positive for: Cataracts - one eye Negative for: Glaucoma; Optic Neuritis Ear/Nose/Mouth/Throat Medical History: Negative for: Chronic sinus problems/congestion; Middle ear problems Hematologic/Lymphatic Medical History: Positive for: Lymphedema Negative for: Anemia; Hemophilia; Human Immunodeficiency Virus; Sickle Cell Disease Respiratory Complaints and Symptoms: No Complaints or Symptoms Medical History: Negative for: Aspiration; Asthma; Chronic Obstructive Pulmonary Disease (COPD); Pneumothorax; Sleep  Apnea; Tuberculosis Cardiovascular Complaints and Symptoms: No Complaints or Symptoms Medical HistoryADALEA, HANDLER ([id number]) Positive for: Hypertension Negative for: Angina; Arrhythmia; Coronary Artery Disease; Deep Vein Thrombosis; Hypotension; Myocardial Infarction; Peripheral Arterial Disease; Peripheral Venous Disease; Phlebitis; Vasculitis Gastrointestinal Medical History: Negative for: Cirrhosis ; Colitis; Crohnos; Hepatitis A; Hepatitis B; Hepatitis C Endocrine Medical History: Negative for: Type I Diabetes; Type II Diabetes Genitourinary Medical History: Negative for: End Stage Renal Disease Immunological Medical History: Negative for:  Lupus Erythematosus; Raynaudos; Scleroderma Integumentary (Skin) Medical History: Negative for: History of Burn; History of pressure wounds Musculoskeletal Medical History: Positive for: Rheumatoid Arthritis Negative for: Gout; Osteoarthritis; Osteomyelitis Neurologic Medical History: Negative for: Dementia; Neuropathy; Quadriplegia; Paraplegia; Seizure Disorder Psychiatric Complaints and Symptoms: No Complaints or Symptoms Medical History: Negative for: Anorexia/bulimia; Confinement Anxiety HBO Extended History Items Eyes: Cataracts Immunizations Pneumococcal Vaccine: Received Pneumococcal Vaccination: Yes Implantable Devices Family and Social History VINETTE, CRITES ([id number]) Cancer: No; Diabetes: Yes - Father; Heart Disease: Yes - Father; Hypertension: No; Kidney Disease: Yes - Mother; Lung Disease: No; Seizures: No; Stroke: No; Thyroid Problems: No; Tuberculosis: No; Former smoker - 20 years stopped; Marital Status - Widowed; Alcohol Use: Rarely; Drug Use: No History; Caffeine Use: Daily; Financial Concerns: No; Food, Clothing or Shelter Needs: No; Support System Lacking: No; Transportation Concerns: No; Advanced Directives: Yes (Not Provided); Patient does not want information on Advanced Directives; Do not resuscitate: No; Living Will: Yes (Not Provided); Medical Power of Attorney: No Physician Affirmation I have reviewed and agree with the above information. Electronic Signature(s) Signed: 07/09/2018 5:05:24 PM By: Curtis Sites Signed: 07/12/2018 8:05:21 AM By: Lenda Kelp PA-C Entered By: Lenda Kelp on 07/09/2018 11:33:38 Lynde, Ohanna Rexene Edison ([id number]) -------------------------------------------------------------------------------- SuperBill Details Patient Name: [name]. Date of Service: 07/09/2018 Medical Record Number: [id number] Patient Account Number: [id number] Date of Birth/Sex: 1925-12-13 (84 y.o. F) Treating RN: Curtis Sites Primary Care Provider: Einar Crow Other Clinician: Referring Provider: Einar Crow Treating Provider/Extender: Linwood Dibbles, Shalicia Craghead Weeks in Treatment: 10 Diagnosis Coding ICD-10 Codes Code Description [phone] Pressure ulcer of left heel, stage 3 L89.94 Pressure ulcer of unspecified site, stage 4 I87.2 Venous insufficiency (chronic) (peripheral) N18.3 Chronic kidney disease, stage 3 (moderate) L08.9 Local infection of the skin and subcutaneous tissue, unspecified Facility Procedures CPT4 Code: [id number] Description: 11042 - DEB SUBQ TISSUE 20 SQ CM/< ICD-10 Diagnosis Description L89.623 Pressure ulcer of left heel, stage 3 L89.94 Pressure ulcer of unspecified site, stage 4 Modifier: Quantity: 1 Physician Procedures CPT4 Code: [id number] Description: 11042 - WC PHYS SUBQ TISS 20 SQ CM ICD-10 Diagnosis Description L89.623 Pressure ulcer of left heel, stage 3 L89.94 Pressure ulcer of unspecified site, stage 4 Modifier: Quantity: 1 Electronic Signature(s) Signed: 07/12/2018 8:05:21 AM By: Lenda Kelp PA-C Entered By: Lenda Kelp on 07/09/2018 11:35:07

## 2018-07-22 NOTE — Progress Notes (Signed)
[name] ([id number]) Visit Report for 07/09/2018 Arrival Information Details Patient Name: [name], [name]. Date of Service: 07/09/2018 10:15 AM Medical Record Number: [id number] Patient Account Number: [id number] Date of Birth/Sex: 1926-02-09 (84 y.o. F) Treating RN: [name] Primary Care [name]: [name] Other Clinician: Referring [name]: [name] Treating [name]/Extender: [name], [name] Weeks in Treatment: 10 Visit Information History Since Last Visit Added or deleted any medications: No Patient Arrived: Walker Any new allergies or adverse reactions: No Arrival Time: 10:31 Had a fall or experienced change in No Accompanied By: sister n law activities of daily living that may affect Transfer Assistance: None risk of falls: Patient Identification Verified: Yes Signs or symptoms of abuse/neglect since last visito No Secondary Verification Process Completed: Yes Hospitalized since last visit: No Implantable device outside of the clinic excluding No cellular tissue based products placed in the center since last visit: Has Dressing in Place as Prescribed: Yes Pain Present Now: No Electronic Signature(s) Signed: 07/09/2018 11:41:35 AM By: [name] Entered By: [name] on 07/09/2018 10:36:47 [name], [name] -------------------------------------------------------------------------------- Lower Extremity Assessment Details Patient Name: [name], [name] Date of Service: 07/09/2018 10:15 AM Medical Record Number: [id number] Patient Account Number: [id number] Date of Birth/Sex: [birth year] (84 y.o. F) Treating RN: [name] Primary Care [name]: [name] Other Clinician: Referring [name]: [name] Treating Etana Beets/Extender: [name], [name] Weeks in Treatment: 10 Electronic Signature(s) Signed: 07/09/2018 11:41:35 AM By: [name] Entered By: [name] on 07/09/2018 10:58:15 [name], [name] -------------------------------------------------------------------------------- Multi Wound Chart Details Patient Name: [name], [name] Date of Service: 07/09/2018 10:15 AM Medical Record Number: [id number] Patient Account Number: [id number] Date of Birth/Sex: [birth year] (84 y.o. F) Treating RN: [name] Primary Care Rushawn Capshaw: [name] Other Clinician: Referring [name]: [name] Treating [name]/Extender: [name], [name] Weeks in Treatment: 10 Vital Signs Height(in): 62 Pulse(bpm): 68 Weight(lbs): Blood Pressure(mmHg): 139/59 Body Mass Index(BMI): Temperature(F): 97.5 Respiratory Rate 16 (breaths/min): Photos: [N/A:N/A] Wound Location: Right Lower Leg - Posterior Left Calcaneus N/A Wounding Event: Pressure Injury Pressure Injury N/A Primary Etiology: Pressure Ulcer Pressure Ulcer N/A Comorbid History: Cataracts, Lymphedema, Cataracts, Lymphedema, N/A Hypertension, Rheumatoid Hypertension, Rheumatoid Arthritis Arthritis Date Acquired: 02/08/2018 04/10/2018 N/A Weeks of Treatment: 10 10 N/A Wound Status: Open Open N/A Measurements L x W x D 4.4x1.2x0.3 0.1x0.1x0.1 N/A (cm) Area (cm) : 4.147 0.008 N/A Volume (cm) : 1.244 0.001 N/A % Reduction in Area: -50.90% 94.90% N/A % Reduction in Volume: -126.20% 96.80% N/A Classification: Category/Stage IV Category/Stage III N/A Exudate Amount: Large Small N/A Exudate Type: Serosanguineous Serous N/A Exudate Color: red, brown amber N/A Wound Margin: Flat and Intact Flat and Intact N/A Granulation Amount: Small (1-33%) None Present (0%) N/A Granulation Quality: Red N/A N/A Necrotic Amount: Large (67-100%) None Present (0%) N/A Necrotic Tissue: Eschar, Adherent Slough N/A N/A Exposed Structures: Fat Layer (Subcutaneous Fascia: No N/A Tissue) Exposed: Yes Fat Layer (Subcutaneous Tendon: Yes Tissue) Exposed: No Fascia: No Tendon: No Muscle: No Muscle: No [name], [name]  ([id number]) Joint: No Joint: No Bone: No Bone: No Epithelialization: Small (1-33%) Small (1-33%) N/A Periwound Skin Texture: Excoriation: No Excoriation: No N/A Induration: No Induration: No Callus: No Callus: No Crepitus: No Crepitus: No Rash: No Rash: No Scarring: No Scarring: No Periwound Skin Moisture: Maceration: No Maceration: No N/A Dry/Scaly: No Dry/Scaly: No Periwound Skin Color: Atrophie Blanche: No Atrophie Blanche: No N/A Cyanosis: No Cyanosis: No Ecchymosis: No Ecchymosis: No Erythema: No Erythema: No Hemosiderin Staining: No Hemosiderin Staining: No Mottled:  No Mottled: No Pallor: No Pallor: No Rubor: No Rubor: No Temperature: No Abnormality No Abnormality N/A Tenderness on Palpation: Yes Yes N/A Wound Preparation: Ulcer Cleansing: Ulcer Cleansing: N/A Rinsed/Irrigated with Saline Rinsed/Irrigated with Saline Topical Anesthetic Applied: Topical Anesthetic Applied: Other: lidocaine 4% Other: lidocaine 4% Treatment Notes Electronic Signature(s) Signed: 07/09/2018 5:05:24 PM By: [name] Entered By: [name] on 07/09/2018 11:20:44 [name], [name] -------------------------------------------------------------------------------- Multi-Disciplinary Care Plan Details Patient Name: Lindsay Monte. Date of Service: 07/09/2018 10:15 AM Medical Record Number: [id number] Patient Account Number: [id number] Date of Birth/Sex: [birth year] (84 y.o. F) Treating RN: [name] Primary Care [name]: [name] Other Clinician: Referring [name]: [name] Treating [name]/Extender: [name], [name] Weeks in Treatment: 10 Active Inactive ` Abuse / Safety / Falls / Self Care Management Nursing Diagnoses: Impaired physical mobility Goals: Patient will not experience any injury related to falls Date Initiated: 04/30/2018 Target Resolution Date: 05/29/2018 Goal Status: Active Interventions: Assess fall  risk on admission and as needed Notes: ` Nutrition Nursing Diagnoses: Potential for alteratiion in Nutrition/Potential for imbalanced nutrition Goals: Patient/caregiver agrees to and verbalizes understanding of need to use nutritional supplements and/or vitamins as prescribed Date Initiated: 04/30/2018 Target Resolution Date: 07/16/2018 Goal Status: Active Interventions: Assess patient nutrition upon admission and as needed per policy Notes: ` Orientation to the Wound Care Program Nursing Diagnoses: Knowledge deficit related to the wound healing center program Goals: Patient/caregiver will verbalize understanding of the Wound Healing Center Program Date Initiated: 04/30/2018 Target Resolution Date: 07/30/2018 Goal Status: Active Interventions: ANABEL, MIDGETTE ([id number]) Provide education on orientation to the wound center Notes: ` Wound/Skin Impairment Nursing Diagnoses: Impaired tissue integrity Goals: Ulcer/skin breakdown will heal within 14 weeks Date Initiated: 04/30/2018 Target Resolution Date: 07/30/2018 Goal Status: Active Interventions: Assess patient/caregiver ability to obtain necessary supplies Assess patient/caregiver ability to perform ulcer/skin care regimen upon admission and as needed Assess ulceration(s) every visit Notes: Electronic Signature(s) Signed: 07/09/2018 5:05:24 PM By: [name] Entered By: [name] on 07/09/2018 11:19:44 [name], [name] ([id number]) -------------------------------------------------------------------------------- Pain Assessment Details Patient Name: Lindsay Monte. Date of Service: 07/09/2018 10:15 AM Medical Record Number: [id number] Patient Account Number: [id number] Date of Birth/Sex: 10/07/1926 (84 y.o. F) Treating RN: [name] Primary Care Grizel Vesely: [name] Other Clinician: Referring Cachet Mccutchen: [name] Treating Aulden Calise/Extender: [name], [name] Weeks in Treatment: 10 Active  Problems Location of Pain Severity and Description of Pain Patient Has Paino No Site Locations Pain Management and Medication Current Pain Management: Goals for Pain Management pt stated has intermittent pain. denies pain at this time. enc to see her Trichelle Lehan PRN. Electronic Signature(s) Signed: 07/09/2018 11:41:35 AM By: [name] Entered By: [name] on 07/09/2018 10:39:45 [name], [name] ([id number]) -------------------------------------------------------------------------------- Wound Assessment Details Patient Name: [name], [name] Date of Service: 07/09/2018 10:15 AM Medical Record Number: [id number] Patient Account Number: [id number] Date of Birth/Sex: [birth year] (84 y.o. F) Treating RN: [name] Primary Care Krosby Ritchie: [name] Other Clinician: Referring Joniah Bednarski: [name] Treating Virgilene Stryker/Extender: [name], [name] Weeks in Treatment: 10 Wound Status Wound Number: 1 Primary Pressure Ulcer Etiology: Wound Location: Right Lower Leg - Posterior Wound Status: Open Wounding Event: Pressure Injury Comorbid Cataracts, Lymphedema, Hypertension, Date Acquired: 02/08/2018 History: Rheumatoid Arthritis Weeks Of Treatment: 10 Clustered Wound: No Photos Photo Uploaded By: [name] on 07/09/2018 11:04:37 Wound Measurements Length: (cm) 4.4 Width: (cm) 1.2 Depth: (cm) 0.3 Area: (cm) 4.147 Volume: (cm) 1.244 % Reduction in Area: -50.9% % Reduction in Volume: -126.2% Epithelialization: Small (1-33%)  Tunneling: No Undermining: No Wound Description Classification: Category/Stage IV Foul Odor A Wound Margin: Flat and Intact Slough/Fibr Exudate Amount: Large Exudate Type: Serosanguineous Exudate Color: red, brown fter Cleansing: No ino Yes Wound Bed Granulation Amount: Small (1-33%) Exposed Structure Granulation Quality: Red Fascia Exposed: No Necrotic Amount: Large (67-100%) Fat Layer (Subcutaneous Tissue) Exposed: Yes Necrotic  Quality: Eschar, Adherent Slough Tendon Exposed: Yes Muscle Exposed: No Joint Exposed: No Bone Exposed: No Periwound Skin Texture Caison, Rajanae H. ([id number]) Texture Color No Abnormalities Noted: No No Abnormalities Noted: No Callus: No Atrophie Blanche: No Crepitus: No Cyanosis: No Excoriation: No Ecchymosis: No Induration: No Erythema: No Rash: No Hemosiderin Staining: No Scarring: No Mottled: No Pallor: No Moisture Rubor: No No Abnormalities Noted: No Dry / Scaly: No Temperature / Pain Maceration: No Temperature: No Abnormality Tenderness on Palpation: Yes Wound Preparation Ulcer Cleansing: Rinsed/Irrigated with Saline Topical Anesthetic Applied: Other: lidocaine 4%, Electronic Signature(s) Signed: 07/09/2018 11:41:35 AM By: [name] Entered By: [name] on 07/09/2018 10:55:27 Marconi, [name] ([id number]) -------------------------------------------------------------------------------- Wound Assessment Details Patient Name: [name], [name] Date of Service: 07/09/2018 10:15 AM Medical Record Number: [id number] Patient Account Number: [id number] Date of Birth/Sex: November 21, 1926 (84 y.o. F) Treating RN: [name] Primary Care Deloma Spindle: [name] Other Clinician: Referring Donnavin Vandenbrink: [name] Treating Romina Divirgilio/Extender: [name], [name] Weeks in Treatment: 10 Wound Status Wound Number: 2 Primary Pressure Ulcer Etiology: Wound Location: Left Calcaneus Wound Status: Open Wounding Event: Pressure Injury Comorbid Cataracts, Lymphedema, Hypertension, Date Acquired: 04/10/2018 History: Rheumatoid Arthritis Weeks Of Treatment: 10 Clustered Wound: No Photos Photo Uploaded By: [name] on 07/09/2018 11:04:38 Wound Measurements Length: (cm) 0.1 Width: (cm) 0.1 Depth: (cm) 0.1 Area: (cm) 0.008 Volume: (cm) 0.001 % Reduction in Area: 94.9% % Reduction in Volume: 96.8% Epithelialization: Small (1-33%) Tunneling: No Undermining:  No Wound Description Classification: Category/Stage III Foul Odor A Wound Margin: Flat and Intact Slough/Fibr Exudate Amount: Small Exudate Type: Serous Exudate Color: amber fter Cleansing: No ino Yes Wound Bed Granulation Amount: None Present (0%) Exposed Structure Necrotic Amount: None Present (0%) Fascia Exposed: No Fat Layer (Subcutaneous Tissue) Exposed: No Tendon Exposed: No Muscle Exposed: No Joint Exposed: No Bone Exposed: No Periwound Skin Texture [name], [name] ([id number]) Texture Color No Abnormalities Noted: Yes No Abnormalities Noted: No Atrophie Blanche: No Moisture Cyanosis: No No Abnormalities Noted: No Ecchymosis: No Dry / Scaly: No Erythema: No Maceration: No Hemosiderin Staining: No Mottled: No Pallor: No Rubor: No Temperature / Pain Temperature: No Abnormality Tenderness on Palpation: Yes Wound Preparation Ulcer Cleansing: Rinsed/Irrigated with Saline Topical Anesthetic Applied: Other: lidocaine 4%, Electronic Signature(s) Signed: 07/09/2018 11:41:35 AM By: [name] Entered By: [name] on 07/09/2018 10:57:58 [name], [name] ([id number]) -------------------------------------------------------------------------------- Vitals Details Patient Name: Lindsay Monte. Date of Service: 07/09/2018 10:15 AM Medical Record Number: [id number] Patient Account Number: [id number] Date of Birth/Sex: Sep 07, 1926 (84 y.o. F) Treating RN: [name] Primary Care Cha Gomillion: [name] Other Clinician: Referring Shauniece Kwan: [name] Treating Chas Axel/Extender: [name], [name] Weeks in Treatment: 10 Vital Signs Time Taken: 10:40 Temperature (F): 97.5 Height (in): 62 Pulse (bpm): 68 Respiratory Rate (breaths/min): 16 Blood Pressure (mmHg): 139/59 Reference Range: 80 - 120 mg / dl Electronic Signature(s) Signed: 07/09/2018 11:41:35 AM By: [name] Entered By: [name] on 07/09/2018 10:40:14

## 2018-07-23 ENCOUNTER — Ambulatory Visit: Payer: Medicare Other | Admitting: Physician Assistant

## 2018-07-25 ENCOUNTER — Encounter
Admission: RE | Admit: 2018-07-25 | Discharge: 2018-07-25 | Disposition: A | Discharge status: Home / Self Care | Payer: Medicare Other | Source: Ambulatory Visit | Attending: Internal Medicine | Admitting: Internal Medicine

## 2018-07-28 ENCOUNTER — Other Ambulatory Visit: Payer: Self-pay

## 2018-07-28 MED ORDER — OXYCODONE HCL 5 MG PO TABS: 5.0000 mg | ORAL_TABLET | Freq: Two times a day (BID) | ORAL | 0 refills | Status: DC

## 2018-07-28 NOTE — Telephone Encounter (Signed)
Rx sent to [facility] phone : [phone] , fax : [phone]  

## 2018-08-05 ENCOUNTER — Encounter: Payer: Medicare Other | Attending: Physician Assistant | Admitting: Physician Assistant

## 2018-08-05 DIAGNOSIS — I129 Hypertensive chronic kidney disease with stage 1 through stage 4 chronic kidney disease, or unspecified chronic kidney disease: Secondary | ICD-10-CM | POA: Insufficient documentation

## 2018-08-05 DIAGNOSIS — L89623 Pressure ulcer of left heel, stage 3: Secondary | ICD-10-CM | POA: Insufficient documentation

## 2018-08-05 DIAGNOSIS — E1122 Type 2 diabetes mellitus with diabetic chronic kidney disease: Secondary | ICD-10-CM | POA: Insufficient documentation

## 2018-08-05 DIAGNOSIS — Z87891 Personal history of nicotine dependence: Secondary | ICD-10-CM | POA: Diagnosis not present

## 2018-08-05 DIAGNOSIS — F0390 Unspecified dementia without behavioral disturbance: Secondary | ICD-10-CM | POA: Diagnosis not present

## 2018-08-05 DIAGNOSIS — Z8249 Family history of ischemic heart disease and other diseases of the circulatory system: Secondary | ICD-10-CM | POA: Insufficient documentation

## 2018-08-05 DIAGNOSIS — L8994 Pressure ulcer of unspecified site, stage 4: Secondary | ICD-10-CM | POA: Diagnosis not present

## 2018-08-05 DIAGNOSIS — M069 Rheumatoid arthritis, unspecified: Secondary | ICD-10-CM | POA: Insufficient documentation

## 2018-08-05 DIAGNOSIS — Z833 Family history of diabetes mellitus: Secondary | ICD-10-CM | POA: Diagnosis not present

## 2018-08-05 DIAGNOSIS — I872 Venous insufficiency (chronic) (peripheral): Secondary | ICD-10-CM | POA: Insufficient documentation

## 2018-08-05 DIAGNOSIS — N183 Chronic kidney disease, stage 3 (moderate): Secondary | ICD-10-CM | POA: Diagnosis not present

## 2018-08-05 DIAGNOSIS — I890 Lymphedema, not elsewhere classified: Secondary | ICD-10-CM | POA: Insufficient documentation

## 2018-08-10 NOTE — Progress Notes (Signed)
NYDA, CHEESMAN ([id number]) Visit Report for 08/05/2018 Arrival Information Details Patient Name: [name], BAERT. Date of Service: 08/05/2018 1:45 PM Medical Record Number: [id number] Patient Account Number: [id number] Date of Birth/Sex: 12/19/1925 (84 y.o. F) Treating RN: Renne Crigler Primary Care Marcelline Temkin: Einar Crow Other Clinician: Referring Rodnisha Blomgren: Einar Crow Treating Kayana Thoen/Extender: Linwood Dibbles, HOYT Weeks in Treatment: 13 Visit Information History Since Last Visit Added or deleted any medications: No Patient Arrived: Wheel Chair Any new allergies or adverse reactions: No Arrival Time: 13:50 Had a fall or experienced change in No activities of daily living that may affect Accompanied By: self risk of falls: Transfer Assistance: Manual Signs or symptoms of abuse/neglect since last visito No Patient Identification Verified: Yes Hospitalized since last visit: No Secondary Verification Process Completed: Yes Implantable device outside of the clinic excluding No Patient Requires Transmission-Based No cellular tissue based products placed in the center Precautions: since last visit: Patient Has Alerts: No Has Dressing in Place as Prescribed: Yes Pain Present Now: No Electronic Signature(s) Signed: 08/05/2018 4:20:46 PM By: Dayton Martes RCP, RRT, CHT Entered By: Weyman Rodney, Lucio Edward on 08/05/2018 13:56:04 [name] ([id number]) -------------------------------------------------------------------------------- Clinic Level of Care Assessment Details Patient Name: [name], [name]. Date of Service: 08/05/2018 1:45 PM Medical Record Number: [id number] Patient Account Number: [id number] Date of Birth/Sex: 10/17/1926 (84 y.o. F) Treating RN: Renne Crigler Primary Care Taelor Waymire: Einar Crow Other Clinician: Referring Nigel Wessman: Einar Crow Treating Jurni Cesaro/Extender: Linwood Dibbles, HOYT Weeks in  Treatment: 13 Clinic Level of Care Assessment Items TOOL 4 Quantity Score X - Use when only an EandM is performed on FOLLOW-UP visit 1 0 ASSESSMENTS - Nursing Assessment / Reassessment X - Reassessment of Co-morbidities (includes updates in patient status) 1 10 X- 1 5 Reassessment of Adherence to Treatment Plan ASSESSMENTS - Wound and Skin Assessment / Reassessment []  - Simple Wound Assessment / Reassessment - one wound 0 X- 2 5 Complex Wound Assessment / Reassessment - multiple wounds []  - 0 Dermatologic / Skin Assessment (not related to wound area) ASSESSMENTS - Focused Assessment []  - Circumferential Edema Measurements - multi extremities 0 []  - 0 Nutritional Assessment / Counseling / Intervention []  - 0 Lower Extremity Assessment (monofilament, tuning fork, pulses) []  - 0 Peripheral Arterial Disease Assessment (using hand held doppler) ASSESSMENTS - Ostomy and/or Continence Assessment and Care []  - Incontinence Assessment and Management 0 []  - 0 Ostomy Care Assessment and Management (repouching, etc.) PROCESS - Coordination of Care X - Simple Patient / Family Education for ongoing care 1 15 []  - 0 Complex (extensive) Patient / Family Education for ongoing care []  - 0 Staff obtains Chiropractor, Records, Test Results / Process Orders []  - 0 Staff telephones HHA, Nursing Homes / Clarify orders / etc []  - 0 Routine Transfer to another Facility (non-emergent condition) []  - 0 Routine Hospital Admission (non-emergent condition) []  - 0 New Admissions / Manufacturing engineer / Ordering NPWT, Apligraf, etc. []  - 0 Emergency Hospital Admission (emergent condition) X- 1 10 Simple Discharge Coordination [name], [name] ([id number]) []  - 0 Complex (extensive) Discharge Coordination PROCESS - Special Needs []  - Pediatric / Minor Patient Management 0 []  - 0 Isolation Patient Management []  - 0 Hearing / Language / Visual special needs []  - 0 Assessment of Community  assistance (transportation, D/C planning, etc.) []  - 0 Additional assistance / Altered mentation []  - 0 Support Surface(s) Assessment (bed, cushion, seat, etc.) INTERVENTIONS - Wound Cleansing / Measurement []  - Simple Wound Cleansing - one wound 0  X- 2 5 Complex Wound Cleansing - multiple wounds X- 1 5 Wound Imaging (photographs - any number of wounds) []  - 0 Wound Tracing (instead of photographs) []  - 0 Simple Wound Measurement - one wound X- 2 5 Complex Wound Measurement - multiple wounds INTERVENTIONS - Wound Dressings X - Small Wound Dressing one or multiple wounds 2 10 []  - 0 Medium Wound Dressing one or multiple wounds []  - 0 Large Wound Dressing one or multiple wounds []  - 0 Application of Medications - topical []  - 0 Application of Medications - injection INTERVENTIONS - Miscellaneous []  - External ear exam 0 []  - 0 Specimen Collection (cultures, biopsies, blood, body fluids, etc.) []  - 0 Specimen(s) / Culture(s) sent or taken to Lab for analysis []  - 0 Patient Transfer (multiple staff / Nurse, adult / Similar devices) []  - 0 Simple Staple / Suture removal (25 or less) []  - 0 Complex Staple / Suture removal (26 or more) []  - 0 Hypo / Hyperglycemic Management (close monitor of Blood Glucose) []  - 0 Ankle / Brachial Index (ABI) - do not check if billed separately X- 1 5 Vital Signs [name], [name] ([id number]) Has the patient been seen at the hospital within the last three years: Yes Total Score: 100 Level Of Care: New/Established - Level 3 Electronic Signature(s) Signed: 08/06/2018 5:59:54 PM By: Renne Crigler Entered By: Renne Crigler on 08/06/2018 17:59:13 [name], [name] ([id number]) -------------------------------------------------------------------------------- Encounter Discharge Information Details Patient Name: [name]. Date of Service: 08/05/2018 1:45 PM Medical Record Number: [id number] Patient Account Number:  [id number] Date of Birth/Sex: December 06, 1925 (84 y.o. F) Treating RN: Curtis Sites Primary Care Armonee Bojanowski: Einar Crow Other Clinician: Referring Jaimya Feliciano: Einar Crow Treating Jarrah Seher/Extender: Linwood Dibbles, HOYT Weeks in Treatment: 13 Encounter Discharge Information Items Discharge Condition: Stable Ambulatory Status: Wheelchair Discharge Destination: Skilled Nursing Facility Telephoned: No Orders Sent: Yes Transportation: Private Auto Accompanied By: self Schedule Follow-up Appointment: Yes Clinical Summary of Care: Electronic Signature(s) Signed: 08/05/2018 5:06:40 PM By: Curtis Sites Entered By: Curtis Sites on 08/05/2018 15:02:55 Otoole, Carlene Coria ([id number]) -------------------------------------------------------------------------------- Lower Extremity Assessment Details Patient Name: Stalnaker, Dorita H. Date of Service: 08/05/2018 1:45 PM Medical Record Number: [id number] Patient Account Number: [id number] Date of Birth/Sex: 12-17-1925 (84 y.o. F) Treating RN: Rema Jasmine Primary Care Letroy Vazguez: Einar Crow Other Clinician: Referring Keaghan Staton: Einar Crow Treating Elisheva Fallas/Extender: Linwood Dibbles, HOYT Weeks in Treatment: 13 Electronic Signature(s) Signed: 08/05/2018 4:01:12 PM By: Rema Jasmine Entered By: Rema Jasmine on 08/05/2018 14:14:51 Lippens, Deepika Rexene [name] ([id number]) -------------------------------------------------------------------------------- Multi Wound Chart Details Patient Name: [name], [name] Date of Service: 08/05/2018 1:45 PM Medical Record Number: [id number] Patient Account Number: [id number] Date of Birth/Sex: [birth year] (84 y.o. F) Treating RN: Renne Crigler Primary Care Liridona Mashaw: Einar Crow Other Clinician: Referring Breaunna Gottlieb: Einar Crow Treating Venna Berberich/Extender: Linwood Dibbles, HOYT Weeks in Treatment: 13 Vital Signs Height(in): 62 Pulse(bpm): 82 Weight(lbs): Blood Pressure(mmHg): 142/74 Body Mass  Index(BMI): Temperature(F): 98.0 Respiratory Rate 16 (breaths/min): Photos: [N/A:N/A] Wound Location: Right Lower Leg - Posterior Left Calcaneus N/A Wounding Event: Pressure Injury Pressure Injury N/A Primary Etiology: Pressure Ulcer Pressure Ulcer N/A Comorbid History: Cataracts, Lymphedema, Cataracts, Lymphedema, N/A Hypertension, Rheumatoid Hypertension, Rheumatoid Arthritis Arthritis Date Acquired: 02/08/2018 04/10/2018 N/A Weeks of Treatment: 13 13 N/A Wound Status: Open Open N/A Measurements L x W x D 3.5x1.4x0.3 0.2x0.2x0.2 N/A (cm) Area (cm) : 3.848 0.031 N/A Volume (cm) : 1.155 0.006 N/A % Reduction in Area: -40.00% 80.30% N/A % Reduction in Volume: -110.00% 80.60% N/A Classification: Category/Stage IV Category/Stage  III N/A Exudate Amount: Large Medium N/A Exudate Type: Purulent Serous N/A Exudate Color: yellow, brown, green amber N/A Wound Margin: Flat and Intact Flat and Intact N/A Granulation Amount: Small (1-33%) None Present (0%) N/A Granulation Quality: Pink N/A N/A Necrotic Amount: Large (67-100%) Small (1-33%) N/A Necrotic Tissue: Eschar, Adherent Slough Eschar, Adherent Slough N/A Exposed Structures: Fat Layer (Subcutaneous Fat Layer (Subcutaneous N/A Tissue) Exposed: Yes Tissue) Exposed: Yes Tendon: Yes Fascia: No Fascia: No Tendon: No Muscle: No Muscle: No Messineo, Sharnetta H. ([id number]) Joint: No Joint: No Bone: No Bone: No Epithelialization: Small (1-33%) Small (1-33%) N/A Periwound Skin Texture: Scarring: Yes Excoriation: No N/A Excoriation: No Induration: No Induration: No Callus: No Callus: No Crepitus: No Crepitus: No Rash: No Rash: No Scarring: No Periwound Skin Moisture: Maceration: No Maceration: No N/A Dry/Scaly: No Dry/Scaly: No Periwound Skin Color: Atrophie Blanche: No Atrophie Blanche: No N/A Cyanosis: No Cyanosis: No Ecchymosis: No Ecchymosis: No Erythema: No Erythema: No Hemosiderin Staining:  No Hemosiderin Staining: No Mottled: No Mottled: No Pallor: No Pallor: No Rubor: No Rubor: No Temperature: No Abnormality No Abnormality N/A Tenderness on Palpation: Yes Yes N/A Wound Preparation: Ulcer Cleansing: Ulcer Cleansing: N/A Rinsed/Irrigated with Saline Rinsed/Irrigated with Saline Topical Anesthetic Applied: Topical Anesthetic Applied: Other: lidocaine 4% Other: lidocaine 4% Treatment Notes Electronic Signature(s) Signed: 08/06/2018 8:12:43 AM By: Renne Crigler Entered By: Renne Crigler on 08/05/2018 14:48:54 Ruberg, Ethne Rexene [name] ([id number]) -------------------------------------------------------------------------------- Multi-Disciplinary Care Plan Details Patient Name: [name]. Date of Service: 08/05/2018 1:45 PM Medical Record Number: [id number] Patient Account Number: [id number] Date of Birth/Sex: 1926/04/23 (84 y.o. F) Treating RN: Renne Crigler Primary Care Solace Manwarren: Einar Crow Other Clinician: Referring Pryor Guettler: Einar Crow Treating Rad Gramling/Extender: Linwood Dibbles, HOYT Weeks in Treatment: 13 Active Inactive ` Abuse / Safety / Falls / Self Care Management Nursing Diagnoses: Impaired physical mobility Goals: Patient will not experience any injury related to falls Date Initiated: 04/30/2018 Target Resolution Date: 05/29/2018 Goal Status: Active Interventions: Assess fall risk on admission and as needed Notes: ` Nutrition Nursing Diagnoses: Potential for alteratiion in Nutrition/Potential for imbalanced nutrition Goals: Patient/caregiver agrees to and verbalizes understanding of need to use nutritional supplements and/or vitamins as prescribed Date Initiated: 04/30/2018 Target Resolution Date: 07/16/2018 Goal Status: Active Interventions: Assess patient nutrition upon admission and as needed per policy Notes: ` Orientation to the Wound Care Program Nursing Diagnoses: Knowledge deficit related to the wound healing  center program Goals: Patient/caregiver will verbalize understanding of the Wound Healing Center Program Date Initiated: 04/30/2018 Target Resolution Date: 07/30/2018 Goal Status: Active Interventions: KATILYN, MILTENBERGER ([id number]) Provide education on orientation to the wound center Notes: ` Wound/Skin Impairment Nursing Diagnoses: Impaired tissue integrity Goals: Ulcer/skin breakdown will heal within 14 weeks Date Initiated: 04/30/2018 Target Resolution Date: 07/30/2018 Goal Status: Active Interventions: Assess patient/caregiver ability to obtain necessary supplies Assess patient/caregiver ability to perform ulcer/skin care regimen upon admission and as needed Assess ulceration(s) every visit Notes: Electronic Signature(s) Signed: 08/06/2018 8:12:43 AM By: Renne Crigler Entered By: Renne Crigler on 08/05/2018 14:48:47 [name], [name] ([id number]) -------------------------------------------------------------------------------- Pain Assessment Details Patient Name: [name]. Date of Service: 08/05/2018 1:45 PM Medical Record Number: [id number] Patient Account Number: [id number] Date of Birth/Sex: 1926-10-29 (84 y.o. F) Treating RN: Renne Crigler Primary Care Armen Waring: Einar Crow Other Clinician: Referring Keren Alverio: Einar Crow Treating Stacey Sago/Extender: Linwood Dibbles, HOYT Weeks in Treatment: 13 Active Problems Location of Pain Severity and Description of Pain Patient Has Paino Yes Site Locations Rate the pain. Current Pain Level: 4 Worst Pain  Level: 8 Least Pain Level: 4 Pain Management and Medication Current Pain Management: Electronic Signature(s) Signed: 08/05/2018 4:20:46 PM By: Dayton Martes RCP, RRT, CHT Signed: 08/06/2018 8:12:43 AM By: Renne Crigler Entered By: Dayton Martes on 08/05/2018 13:56:35 [name], [name]  ([id number]) -------------------------------------------------------------------------------- Patient/Caregiver Education Details Patient Name: [name]. Date of Service: 08/05/2018 1:45 PM Medical Record Number: [id number] Patient Account Number: [id number] Date of Birth/Gender: 05/04/1926 (84 y.o. F) Treating RN: Curtis Sites Primary Care Physician: Einar Crow Other Clinician: Referring Physician: Einar Crow Treating Physician/Extender: Skeet Simmer in Treatment: 13 Education Assessment Education Provided To: Caregiver SNF nurses Education Topics Provided Wound/Skin Impairment: Handouts: Other: wound care orders Methods: Clinical cytogeneticist) Signed: 08/05/2018 5:06:40 PM By: Curtis Sites Entered By: Curtis Sites on 08/05/2018 15:03:15 Ambrosio, Dollie Rexene [name] ([id number]) -------------------------------------------------------------------------------- Wound Assessment Details Patient Name: [name], [name] Date of Service: 08/05/2018 1:45 PM Medical Record Number: [id number] Patient Account Number: [id number] Date of Birth/Sex: 08/31/1926 (84 y.o. F) Treating RN: Rema Jasmine Primary Care Syd Manges: Einar Crow Other Clinician: Referring Talma Aguillard: Einar Crow Treating Melissaann Dizdarevic/Extender: Linwood Dibbles, HOYT Weeks in Treatment: 13 Wound Status Wound Number: 1 Primary Pressure Ulcer Etiology: Wound Location: Right Lower Leg - Posterior Wound Status: Open Wounding Event: Pressure Injury Comorbid Cataracts, Lymphedema, Hypertension, Date Acquired: 02/08/2018 History: Rheumatoid Arthritis Weeks Of Treatment: 13 Clustered Wound: No Photos Photo Uploaded By: Rema Jasmine on 08/05/2018 14:17:07 Wound Measurements Length: (cm) 3.5 Width: (cm) 1.4 Depth: (cm) 0.3 Area: (cm) 3.848 Volume: (cm) 1.155 % Reduction in Area: -40% % Reduction in Volume: -110% Epithelialization: Small (1-33%) Tunneling: No Undermining:  No Wound Description Classification: Category/Stage IV Foul Odor A Wound Margin: Flat and Intact Slough/Fibr Exudate Amount: Large Exudate Type: Purulent Exudate Color: yellow, brown, green fter Cleansing: No ino Yes Wound Bed Granulation Amount: Small (1-33%) Exposed Structure Granulation Quality: Pink Fascia Exposed: No Necrotic Amount: Large (67-100%) Fat Layer (Subcutaneous Tissue) Exposed: Yes Necrotic Quality: Eschar, Adherent Slough Tendon Exposed: Yes Muscle Exposed: No Joint Exposed: No Bone Exposed: No Periwound Skin Texture Socorro, Pearlena H. ([id number]) Texture Color No Abnormalities Noted: No No Abnormalities Noted: No Callus: No Atrophie Blanche: No Crepitus: No Cyanosis: No Excoriation: No Ecchymosis: No Induration: No Erythema: No Rash: No Hemosiderin Staining: No Scarring: Yes Mottled: No Pallor: No Moisture Rubor: No No Abnormalities Noted: No Dry / Scaly: No Temperature / Pain Maceration: No Temperature: No Abnormality Tenderness on Palpation: Yes Wound Preparation Ulcer Cleansing: Rinsed/Irrigated with Saline Topical Anesthetic Applied: Other: lidocaine 4%, Treatment Notes Wound #1 (Right, Posterior Lower Leg) 1. Cleansed with: Clean wound with Normal Saline 4. Dressing Applied: Santyl Ointment 5. Secondary Dressing Applied Bordered Foam Dressing Electronic Signature(s) Signed: 08/05/2018 4:01:12 PM By: Rema Jasmine Entered By: Rema Jasmine on 08/05/2018 14:08:46 Spegal, Carlene Coria ([id number]) -------------------------------------------------------------------------------- Wound Assessment Details Patient Name: [name], [name] Date of Service: 08/05/2018 1:45 PM Medical Record Number: [id number] Patient Account Number: [id number] Date of Birth/Sex: [birth year] (84 y.o. F) Treating RN: Rema Jasmine Primary Care Mouhamed Glassco: Einar Crow Other Clinician: Referring Onie Kasparek: Einar Crow Treating Javius Sylla/Extender: Linwood Dibbles,  HOYT Weeks in Treatment: 13 Wound Status Wound Number: 2 Primary Pressure Ulcer Etiology: Wound Location: Left Calcaneus Wound Status: Open Wounding Event: Pressure Injury Comorbid Cataracts, Lymphedema, Hypertension, Date Acquired: 04/10/2018 History: Rheumatoid Arthritis Weeks Of Treatment: 13 Clustered Wound: No Photos Photo Uploaded By: Rema Jasmine on 08/05/2018 14:17:08 Wound Measurements Length: (cm) 0.2 Width: (cm) 0.2 Depth: (cm) 0.2 Area: (cm) 0.031 Volume: (cm) 0.006 % Reduction in Area:  80.3% % Reduction in Volume: 80.6% Epithelialization: Small (1-33%) Tunneling: No Undermining: No Wound Description Classification: Category/Stage III Foul Odor A Wound Margin: Flat and Intact Slough/Fibr Exudate Amount: Medium Exudate Type: Serous Exudate Color: amber fter Cleansing: No ino Yes Wound Bed Granulation Amount: None Present (0%) Exposed Structure Necrotic Amount: Small (1-33%) Fascia Exposed: No Necrotic Quality: Eschar, Adherent Slough Fat Layer (Subcutaneous Tissue) Exposed: Yes Tendon Exposed: No Muscle Exposed: No Joint Exposed: No Bone Exposed: No Periwound Skin Texture [name], [name] ([id number]) Texture Color No Abnormalities Noted: Yes No Abnormalities Noted: No Atrophie Blanche: No Moisture Cyanosis: No No Abnormalities Noted: No Ecchymosis: No Dry / Scaly: No Erythema: No Maceration: No Hemosiderin Staining: No Mottled: No Pallor: No Rubor: No Temperature / Pain Temperature: No Abnormality Tenderness on Palpation: Yes Wound Preparation Ulcer Cleansing: Rinsed/Irrigated with Saline Topical Anesthetic Applied: Other: lidocaine 4%, Treatment Notes Wound #2 (Left Calcaneus) 1. Cleansed with: Clean wound with Normal Saline 4. Dressing Applied: Santyl Ointment 5. Secondary Dressing Applied Bordered Foam Dressing Electronic Signature(s) Signed: 08/05/2018 4:01:12 PM By: Rema Jasmine Entered By: Rema Jasmine on 08/05/2018  14:14:45 [name], [name] ([id number]) -------------------------------------------------------------------------------- Vitals Details Patient Name: [name]. Date of Service: 08/05/2018 1:45 PM Medical Record Number: [id number] Patient Account Number: [id number] Date of Birth/Sex: 08/05/1926 (84 y.o. F) Treating RN: Renne Crigler Primary Care Cherri Yera: Einar Crow Other Clinician: Referring Rylei Codispoti: Einar Crow Treating Dominique Calvey/Extender: Linwood Dibbles, HOYT Weeks in Treatment: 13 Vital Signs Time Taken: 13:55 Temperature (F): 98.0 Height (in): 62 Pulse (bpm): 82 Respiratory Rate (breaths/min): 16 Blood Pressure (mmHg): 142/74 Reference Range: 80 - 120 mg / dl Electronic Signature(s) Signed: 08/05/2018 4:20:46 PM By: Dayton Martes RCP, RRT, CHT Entered By: Dayton Martes on 08/05/2018 14:00:49

## 2018-08-10 NOTE — Progress Notes (Signed)
ELOWEN, DEBRUYN ([id number]) Visit Report for 08/05/2018 Chief Complaint Document Details Patient Name: [name], [name]. Date of Service: 08/05/2018 1:45 PM Medical Record Number: [id number] Patient Account Number: [id number] Date of Birth/Sex: 1926-04-17 (84 y.o. F) Treating RN: Renne Crigler Primary Care Provider: Einar Crow Other Clinician: Referring Provider: Einar Crow Treating Provider/Extender: Linwood Dibbles, Dalton Molesworth Weeks in Treatment: 13 Information Obtained from: Patient Chief Complaint She is here for multiple wounds Electronic Signature(s) Signed: 08/06/2018 8:54:03 AM By: Lenda Kelp PA-C Entered By: Lenda Kelp on 08/05/2018 13:51:20 [name], [name] ([id number]) -------------------------------------------------------------------------------- HPI Details Patient Name: [name]. Date of Service: 08/05/2018 1:45 PM Medical Record Number: [id number] Patient Account Number: [id number] Date of Birth/Sex: 08-Nov-1926 (84 y.o. F) Treating RN: Renne Crigler Primary Care Provider: Einar Crow Other Clinician: Referring Provider: Einar Crow Treating Provider/Extender: Linwood Dibbles, Honestee Revard Weeks in Treatment: 13 History of Present Illness HPI Description: 04/30/18- She is here for initial evaluation of right posterior calf and left heel pressure ulcers. She has a documented history of dementia but presents today alert and oriented o3, confused with time frame/dates pertaining to recent injury, hospitalization and placementotherwise she appears appropriate to make decisions at today's visit. On 01/13/18 she was admitted to [facility] after a fall, she sustained a closed fracture to the proximal tib-fib on the right side. She was treated nonsurgically with a full-length leg immobilizer/brace and NWB status. She began to complain of pain to the posterior aspect of the right leg, on 5/21 she was noted to have a  pressure injury, the patient states that she was exchanged to a shorter knee immobilizer on Tuesday 6/4. This is currently being treated with Santyl at the nursing home, she was transitioned from rehabilitation to long-term care at [facility]. She also presents with a left heel pressure injury of unknown chronicity; there is no record of it in nursing home documentation. She had a blister noted to the left great toe, according to nursing home documentation, on 3/7. This now appears as a reabsorbing blood blister/deep tissue injury and is not currently open. She denies wearing any heel open offloading boots for heel pressure relief. She has been advised to keep pressure off of left heel and right posterior leg at all times; offloading heel boots have been ordered for facility to provide. She is currently on Bactrim for culture (5/31) with trimeth/sulfa sensitive staph species (coag neg). She does admit to loose stools, 1-3 per day; will monitor next week for improvement and consider testing for c-diff. She is a DNR/Pallitive care per facility records. She does have reduced ABI in office today and we discussed the potential for delayed healing given this result, the risk for deterioration given the level and possible need for future testing/intervention. It is her decision that we treat conservatively at this time, not sending her for additional tests. 05/07/18 on evaluation today patient appears to be doing somewhat better I feel in regard to her ulcers on the left heel and the right lateral leg. She's been tolerating the dressing changes without complication with that being said she continues to have some discomfort she is also still recovering from her tib/fib fracture on the right. She has additional swelling of the right lower extremity secondary to this. No fevers, chills, nausea, or vomiting noted at this time. 05/18/18 on evaluation today patient's wound on the left heel actually  appears to be doing better. Unfortunately the wound on the right lateral lower extremity actually appears  to be about the same maybe even a little bit more bruising noted today. Unfortunately. With that being said I think the main issue is that she's getting pressure to the site when she actually is sitting in her wheelchair which she does for most of the day according what she tells me today. I do think that a Prevalon Boots may help to offload this to a degree and this is something that I did recommend for her today. We have previously it looks like order bilateral Prevalon Boots but nonetheless she has one for the left not for the right she tells me. We were going to see if we can work on that. 06/04/18 on evaluation today patient actually appears to be doing better in regard to left heel ulcer that was actually some dead skin surrounding it appeared to be somewhat macerated I think she may tolerate some light debridement here in order to allow for a different dressing possibly Prisma to be utilized. In regard to the right lateral lower extremity I think that Santyl will likely still be the most appropriate dressing. 06/17/18-She is seen in follow-up evaluation for right posterior calf and left posterior heel ulcer. 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 She continues to complain of significant pain to the left posterior heel. She is tolerating dressing changes which we will continue 06/24/18-she is seen in follow-up evaluation for right posterior calf and left posterior heel ulcer. The right posterior calf ulcer is stable/improved with any nonviable tissue, tendon remains exposed. The left posterior heel is improved with pain. She admits to compliance with wearing offloading boots at night while in bed. We will continue with same treatment plan she can follow- up in 2 weeks 07/09/18 on evaluation today patient appears to be doing decently well in regard to her wounds in my  pinion. She has been tolerating the dressing changes as well as debridement with only minimal discomfort. Her right lateral lower Trinity seems to be the worst areas for debridement is concerned. Fortunately there's no evidence of infection at this point in time. Her heel Valente, Ventura H. ([id number]) still has some depth to it were using Prisma at this point. 07/22/18 on evaluation today patient's wounds actually seem to be showing some signs of improvement especially in regard to the right lateral lower extremity. With that being said the left heel is not making quite as much improvement as I would like. I do believe that we may need to switch the dressing to Santyl at the site. 08/05/18 on evaluation today patient actually appears to be doing rather well in regard to her right lateral lower Trinity ulcer. Her left heel ulcer is actually doing about the same. She's been tolerating the dressing changes. Electronic Signature(s) Signed: 08/06/2018 8:54:03 AM By: Lenda Kelp PA-C Entered By: Lenda Kelp on 08/05/2018 14:55:52 [name], [name] ([id number]) -------------------------------------------------------------------------------- Physical Exam Details Patient Name: [name], [name] Date of Service: 08/05/2018 1:45 PM Medical Record Number: [id number] Patient Account Number: [id number] Date of Birth/Sex: [birth year] (84 y.o. F) Treating RN: Renne Crigler Primary Care Provider: Einar Crow Other Clinician: Referring Provider: Einar Crow Treating Provider/Extender: STONE III, Trevante Tennell Weeks in Treatment: 13 Constitutional Well-nourished and well-hydrated in no acute distress. Respiratory normal breathing without difficulty. clear to auscultation bilaterally. Cardiovascular regular rate and rhythm with normal S1, S2. Psychiatric this patient is able to make decisions and demonstrates good insight into disease process. Alert and Oriented x 3. pleasant and  cooperative. Notes Patient's wound bed actually shows evidence of slough at both locations. I was able to mechanically debride away some of this necrotic material with saline and gauze and she tolerated this with minimal discomfort post debridement both wound beds appear to be doing better. I do believe the Santyl has been of benefit for her. My suggestion is gonna be that we likely continue with this treatment currently. Electronic Signature(s) Signed: 08/06/2018 8:54:03 AM By: Lenda Kelp PA-C Entered By: Lenda Kelp on 08/05/2018 14:56:25 [name], [name] ([id number]) -------------------------------------------------------------------------------- Physician Orders Details Patient Name: [name]. Date of Service: 08/05/2018 1:45 PM Medical Record Number: [id number] Patient Account Number: [id number] Date of Birth/Sex: 12/29/1925 (84 y.o. F) Treating RN: Renne Crigler Primary Care Provider: Einar Crow Other Clinician: Referring Provider: Einar Crow Treating Provider/Extender: Linwood Dibbles, Oluwasemilore Bahl Weeks in Treatment: 66 Verbal / Phone Orders: No Diagnosis Coding ICD-10 Coding Code Description [phone] Pressure ulcer of left heel, stage 3 L89.94 Pressure ulcer of unspecified site, stage 4 I87.2 Venous insufficiency (chronic) (peripheral) N18.3 Chronic kidney disease, stage 3 (moderate) L08.9 Local infection of the skin and subcutaneous tissue, unspecified Wound Cleansing Wound #1 Right,Posterior Lower Leg o Clean wound with Normal Saline. o Cleanse wound with mild soap and water o May Shower, gently pat wound dry prior to applying new dressing. Wound #2 Left Calcaneus o Clean wound with Normal Saline. o Cleanse wound with mild soap and water o May Shower, gently pat wound dry prior to applying new dressing. Anesthetic (add to Medication List) Wound #1 Right,Posterior Lower Leg o Topical Lidocaine 4% cream applied to wound bed prior  to debridement (In Clinic Only). - at [facility] only Wound #2 Left Calcaneus o Topical Lidocaine 4% cream applied to wound bed prior to debridement (In Clinic Only). - at [facility] only Primary Wound Dressing Wound #1 Right,Posterior Lower Leg o Saline moistened gauze o Santyl Ointment Wound #2 Left Calcaneus o Saline moistened gauze o Santyl Ointment Secondary Dressing Wound #1 Right,Posterior Lower Leg o Dry Gauze o Boardered Foam Dressing Wound #2 Left Calcaneus o Dry Gauze [name], [name] ([id number]) o Boardered Foam Dressing Dressing Change Frequency Wound #1 Right,Posterior Lower Leg o Change dressing every day. Wound #2 Left Calcaneus o Change dressing every day. Follow-up Appointments Wound #1 Right,Posterior Lower Leg o Return Appointment in 2 weeks. Wound #2 Left Calcaneus o Return Appointment in 2 weeks. Edema Control Wound #1 Right,Posterior Lower Leg o Elevate legs to the level of the heart and pump ankles as often as possible Wound #2 Left Calcaneus o Elevate legs to the level of the heart and pump ankles as often as possible Off-Loading Wound #1 Right,Posterior Lower Leg o Other: - PLEASE PROVIDE PATIENT WITH BILATERAL PREVALON BOOTS FOR PATIENT TO WEAR AT ALL TIMES WHILE IN BED AND IN WHEELCHAIR Wound #2 Left Calcaneus o Other: - PLEASE PROVIDE PATIENT WITH BILATERAL PREVALON BOOTS FOR PATIENT TO WEAR AT ALL TIMES WHILE IN BED AND IN [facility] Additional Orders / Instructions Wound #1 Right,Posterior Lower Leg o Increase protein intake. o Activity as tolerated o Other: - Please add facility wound healing vitamins per facility protocol Wound #2 Left Calcaneus o Increase protein intake. o Activity as tolerated o Other: - Please add facility wound healing vitamins per facility protocol Medications-please add to medication list. Wound #1 Right,Posterior Lower Leg o Santyl Enzymatic Ointment Electronic  Signature(s) Signed: 08/06/2018 8:12:43 AM By: Renne Crigler Signed: 08/06/2018 8:54:03 AM By: Lenda Kelp PA-C Entered  By: Renne Crigler on 08/05/2018 14:50:19 MITSUE, PEERY ([id number] Manchester, [name] ([id number]) -------------------------------------------------------------------------------- Problem List Details Patient Name: Mathieson, Monasia H. Date of Service: 08/05/2018 1:45 PM Medical Record Number: [id number] Patient Account Number: [id number] Date of Birth/Sex: Nov 09, 1926 (84 y.o. F) Treating RN: Renne Crigler Primary Care Provider: Einar Crow Other Clinician: Referring Provider: Einar Crow Treating Provider/Extender: Linwood Dibbles, Toa Mia Weeks in Treatment: 13 Active Problems ICD-10 Evaluated Encounter Code Description Active Date Today Diagnosis L89.623 Pressure ulcer of left heel, stage 3 04/30/2018 No Yes L89.94 Pressure ulcer of unspecified site, stage 4 04/30/2018 No Yes I87.2 Venous insufficiency (chronic) (peripheral) 04/30/2018 No Yes N18.3 Chronic kidney disease, stage 3 (moderate) 04/30/2018 No Yes L08.9 Local infection of the skin and subcutaneous tissue, 04/30/2018 No Yes unspecified Inactive Problems Resolved Problems Electronic Signature(s) Signed: 08/06/2018 8:54:03 AM By: Lenda Kelp PA-C Entered By: Lenda Kelp on 08/05/2018 13:51:14 [name], [name] ([id number]) -------------------------------------------------------------------------------- Progress Note Details Patient Name: [name]. Date of Service: 08/05/2018 1:45 PM Medical Record Number: [id number] Patient Account Number: [id number] Date of Birth/Sex: [birth year] (84 y.o. F) Treating RN: Renne Crigler Primary Care Provider: Einar Crow Other Clinician: Referring Provider: Einar Crow Treating Provider/Extender: Linwood Dibbles, Keisa Blow Weeks in Treatment: 13 Subjective Chief Complaint Information obtained from Patient She is here for  multiple wounds History of Present Illness (HPI) 04/30/18- She is here for initial evaluation of right posterior calf and left heel pressure ulcers. She has a documented history of dementia but presents today alert and oriented o3, confused with time frame/dates pertaining to recent injury, hospitalization and placementotherwise she appears appropriate to make decisions at today's visit. On 01/13/18 she was admitted to [facility] after a fall, she sustained a closed fracture to the proximal tib-fib on the right side. She was treated nonsurgically with a full-length leg immobilizer/brace and NWB status. She began to complain of pain to the posterior aspect of the right leg, on 5/21 she was noted to have a pressure injury, the patient states that she was exchanged to a shorter knee immobilizer on Tuesday 6/4. This is currently being treated with Santyl at the nursing home, she was transitioned from rehabilitation to long-term care at [facility]. She also presents with a left heel pressure injury of unknown chronicity; there is no record of it in nursing home documentation. She had a blister noted to the left great toe, according to nursing home documentation, on 3/7. This now appears as a reabsorbing blood blister/deep tissue injury and is not currently open. She denies wearing any heel open offloading boots for heel pressure relief. She has been advised to keep pressure off of left heel and right posterior leg at all times; offloading heel boots have been ordered for facility to provide. She is currently on Bactrim for culture (5/31) with trimeth/sulfa sensitive staph species (coag neg). She does admit to loose stools, 1-3 per day; will monitor next week for improvement and consider testing for c-diff. She is a DNR/Pallitive care per facility records. She does have reduced ABI in office today and we discussed the potential for delayed healing given this result, the risk  for deterioration given the level and possible need for future testing/intervention. It is her decision that we treat conservatively at this time, not sending her for additional tests. 05/07/18 on evaluation today patient appears to be doing somewhat better I feel in regard to her ulcers on the left heel and the right lateral leg. She's been tolerating the  dressing changes without complication with that being said she continues to have some discomfort she is also still recovering from her tib/fib fracture on the right. She has additional swelling of the right lower extremity secondary to this. No fevers, chills, nausea, or vomiting noted at this time. 05/18/18 on evaluation today patient's wound on the left heel actually appears to be doing better. Unfortunately the wound on the right lateral lower extremity actually appears to be about the same maybe even a little bit more bruising noted today. Unfortunately. With that being said I think the main issue is that she's getting pressure to the site when she actually is sitting in her wheelchair which she does for most of the day according what she tells me today. I do think that a Prevalon Boots may help to offload this to a degree and this is something that I did recommend for her today. We have previously it looks like order bilateral Prevalon Boots but nonetheless she has one for the left not for the right she tells me. We were going to see if we can work on that. 06/04/18 on evaluation today patient actually appears to be doing better in regard to left heel ulcer that was actually some dead skin surrounding it appeared to be somewhat macerated I think she may tolerate some light debridement here in order to allow for a different dressing possibly Prisma to be utilized. In regard to the right lateral lower extremity I think that Santyl will likely still be the most appropriate dressing. 06/17/18-She is seen in follow-up evaluation for right posterior  calf and left posterior heel ulcer. 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 She continues to complain of significant pain to the left posterior heel. She is tolerating dressing changes which we will continue 06/24/18-she is seen in follow-up evaluation for right posterior calf and left posterior heel ulcer. The right posterior calf ulcer is stable/improved with any nonviable tissue, tendon remains exposed. The left posterior heel is improved with pain. She admits [name], TIMMINS. ([id number]) to compliance with wearing offloading boots at night while in bed. We will continue with same treatment plan she can follow- up in 2 weeks 07/09/18 on evaluation today patient appears to be doing decently well in regard to her wounds in my pinion. She has been tolerating the dressing changes as well as debridement with only minimal discomfort. Her right lateral lower Trinity seems to be the worst areas for debridement is concerned. Fortunately there's no evidence of infection at this point in time. Her heel still has some depth to it were using Prisma at this point. 07/22/18 on evaluation today patient's wounds actually seem to be showing some signs of improvement especially in regard to the right lateral lower extremity. With that being said the left heel is not making quite as much improvement as I would like. I do believe that we may need to switch the dressing to Santyl at the site. 08/05/18 on evaluation today patient actually appears to be doing rather well in regard to her right lateral lower Trinity ulcer. Her left heel ulcer is actually doing about the same. She's been tolerating the dressing changes. Patient History Information obtained from Patient. Family History Diabetes - Father, Heart Disease - Father, Kidney Disease - Mother, No family history of Cancer, Hypertension, Lung Disease, Seizures, Stroke, Thyroid Problems, Tuberculosis. Social History Former smoker - 20  years stopped, Marital Status - Widowed, Alcohol Use - Rarely, Drug Use -  No History, Caffeine Use - Daily. Review of Systems (ROS) Constitutional Symptoms (General Health) Denies complaints or symptoms of Fever, Chills. Respiratory The patient has no complaints or symptoms. Cardiovascular The patient has no complaints or symptoms. Psychiatric The patient has no complaints or symptoms. Objective Constitutional Well-nourished and well-hydrated in no acute distress. Vitals Time Taken: 1:55 PM, Height: 62 in, Temperature: 98.0 F, Pulse: 82 bpm, Respiratory Rate: 16 breaths/min, Blood Pressure: 142/74 mmHg. Respiratory normal breathing without difficulty. clear to auscultation bilaterally. Cardiovascular regular rate and rhythm with normal S1, S2. [name], [name] ([id number]) Psychiatric this patient is able to make decisions and demonstrates good insight into disease process. Alert and Oriented x 3. pleasant and cooperative. General Notes: Patient's wound bed actually shows evidence of slough at both locations. I was able to mechanically debride away some of this necrotic material with saline and gauze and she tolerated this with minimal discomfort post debridement both wound beds appear to be doing better. I do believe the Santyl has been of benefit for her. My suggestion is gonna be that we likely continue with this treatment currently. Integumentary (Hair, Skin) Wound #1 status is Open. Original cause of wound was Pressure Injury. The wound is located on the Right,Posterior Lower Leg. The wound measures 3.5cm length x 1.4cm width x 0.3cm depth; 3.848cm^2 area and 1.155cm^3 volume. There is tendon and Fat Layer (Subcutaneous Tissue) Exposed exposed. There is no tunneling or undermining noted. There is a large amount of purulent drainage noted. The wound margin is flat and intact. There is small (1-33%) pink granulation within the wound bed. There is a large (67-100%) amount of  necrotic tissue within the wound bed including Eschar and Adherent Slough. The periwound skin appearance exhibited: Scarring. The periwound skin appearance did not exhibit: Callus, Crepitus, Excoriation, Induration, Rash, Dry/Scaly, Maceration, Atrophie Blanche, Cyanosis, Ecchymosis, Hemosiderin Staining, Mottled, Pallor, Rubor, Erythema. Periwound temperature was noted as No Abnormality. The periwound has tenderness on palpation. Wound #2 status is Open. Original cause of wound was Pressure Injury. The wound is located on the Left Calcaneus. The wound measures 0.2cm length x 0.2cm width x 0.2cm depth; 0.031cm^2 area and 0.006cm^3 volume. There is Fat Layer (Subcutaneous Tissue) Exposed exposed. There is no tunneling or undermining noted. There is a medium amount of serous drainage noted. The wound margin is flat and intact. There is no granulation within the wound bed. There is a small (1-33%) amount of necrotic tissue within the wound bed including Eschar and Adherent Slough. The periwound skin appearance had no abnormalities noted for texture. The periwound skin appearance did not exhibit: Dry/Scaly, Maceration, Atrophie Blanche, Cyanosis, Ecchymosis, Hemosiderin Staining, Mottled, Pallor, Rubor, Erythema. Periwound temperature was noted as No Abnormality. The periwound has tenderness on palpation. Assessment Active Problems ICD-10 Pressure ulcer of left heel, stage 3 Pressure ulcer of unspecified site, stage 4 Venous insufficiency (chronic) (peripheral) Chronic kidney disease, stage 3 (moderate) Local infection of the skin and subcutaneous tissue, unspecified Plan Wound Cleansing: Wound #1 Right,Posterior Lower Leg: Clean wound with Normal Saline. Cleanse wound with mild soap and water May Shower, gently pat wound dry prior to applying new dressing. Wound #2 Left Calcaneus: [name], [name] ([id number]) Clean wound with Normal Saline. Cleanse wound with mild soap and water May  Shower, gently pat wound dry prior to applying new dressing. Anesthetic (add to Medication List): Wound #1 Right,Posterior Lower Leg: Topical Lidocaine 4% cream applied to wound bed prior to debridement (In Clinic Only). - at [facility] only  Wound #2 Left Calcaneus: Topical Lidocaine 4% cream applied to wound bed prior to debridement (In Clinic Only). - at [facility] only Primary Wound Dressing: Wound #1 Right,Posterior Lower Leg: Saline moistened gauze Santyl Ointment Wound #2 Left Calcaneus: Saline moistened gauze Santyl Ointment Secondary Dressing: Wound #1 Right,Posterior Lower Leg: Dry Gauze Boardered Foam Dressing Wound #2 Left Calcaneus: Dry Gauze Boardered Foam Dressing Dressing Change Frequency: Wound #1 Right,Posterior Lower Leg: Change dressing every day. Wound #2 Left Calcaneus: Change dressing every day. Follow-up Appointments: Wound #1 Right,Posterior Lower Leg: Return Appointment in 2 weeks. Wound #2 Left Calcaneus: Return Appointment in 2 weeks. Edema Control: Wound #1 Right,Posterior Lower Leg: Elevate legs to the level of the heart and pump ankles as often as possible Wound #2 Left Calcaneus: Elevate legs to the level of the heart and pump ankles as often as possible Off-Loading: Wound #1 Right,Posterior Lower Leg: Other: - PLEASE PROVIDE PATIENT WITH BILATERAL PREVALON BOOTS FOR PATIENT TO WEAR AT ALL TIMES WHILE IN BED AND IN WHEELCHAIR Wound #2 Left Calcaneus: Other: - PLEASE PROVIDE PATIENT WITH BILATERAL PREVALON BOOTS FOR PATIENT TO WEAR AT ALL TIMES WHILE IN BED AND IN [facility] Additional Orders / Instructions: Wound #1 Right,Posterior Lower Leg: Increase protein intake. Activity as tolerated Other: - Please add facility wound healing vitamins per facility protocol Wound #2 Left Calcaneus: Increase protein intake. Activity as tolerated Other: - Please add facility wound healing vitamins per facility protocol Medications-please add to  medication list.: Wound #1 Right,Posterior Lower Leg: Santyl Enzymatic Ointment [name], [name] ([id number]) At this point I'm gonna suggest that we continue with the above wound care measures for the next week. The patient is in agreement with the plan. We will subsequently see were things stand at follow-up. Please see above for specific wound care orders. We will see patient for re-evaluation in 2 week(s) here in the clinic. If anything worsens or changes patient will contact our office for additional recommendations. Electronic Signature(s) Signed: 08/06/2018 8:54:03 AM By: Lenda Kelp PA-C Entered By: Lenda Kelp on 08/05/2018 14:57:06 [name], [name] ([id number]) -------------------------------------------------------------------------------- ROS/PFSH Details Patient Name: [name]. Date of Service: 08/05/2018 1:45 PM Medical Record Number: [id number] Patient Account Number: [id number] Date of Birth/Sex: May 12, 1926 (84 y.o. F) Treating RN: Renne Crigler Primary Care Provider: Einar Crow Other Clinician: Referring Provider: Einar Crow Treating Provider/Extender: Linwood Dibbles, Keeva Reisen Weeks in Treatment: 13 Information Obtained From Patient Wound History Do you currently have one or more open woundso Yes How many open wounds do you currently haveo 2 Approximately how long have you had your woundso 2 weeks How have you been treating your wound(s) until nowo 2 weeks Has your wound(s) ever healed and then re-openedo No Have you had any lab work done in the past montho No Have you tested positive for an antibiotic resistant organism (MRSA, VRE)o No Have you tested positive for osteomyelitis (bone infection)o No Have you had any tests for circulation on your legso No Constitutional Symptoms (General Health) Complaints and Symptoms: Negative for: Fever; Chills Eyes Medical History: Positive for: Cataracts - one eye Negative for: Glaucoma;  Optic Neuritis Ear/Nose/Mouth/Throat Medical History: Negative for: Chronic sinus problems/congestion; Middle ear problems Hematologic/Lymphatic Medical History: Positive for: Lymphedema Negative for: Anemia; Hemophilia; Human Immunodeficiency Virus; Sickle Cell Disease Respiratory Complaints and Symptoms: No Complaints or Symptoms Medical History: Negative for: Aspiration; Asthma; Chronic Obstructive Pulmonary Disease (COPD); Pneumothorax; Sleep Apnea; Tuberculosis Cardiovascular Complaints and Symptoms: No Complaints or Symptoms Medical History: [name], Lindsay  H. ([id number]) Positive for: Hypertension Negative for: Angina; Arrhythmia; Coronary Artery Disease; Deep Vein Thrombosis; Hypotension; Myocardial Infarction; Peripheral Arterial Disease; Peripheral Venous Disease; Phlebitis; Vasculitis Gastrointestinal Medical History: Negative for: Cirrhosis ; Colitis; Crohnos; Hepatitis A; Hepatitis B; Hepatitis C Endocrine Medical History: Negative for: Type I Diabetes; Type II Diabetes Genitourinary Medical History: Negative for: End Stage Renal Disease Immunological Medical History: Negative for: Lupus Erythematosus; Raynaudos; Scleroderma Integumentary (Skin) Medical History: Negative for: History of Burn; History of pressure wounds Musculoskeletal Medical History: Positive for: Rheumatoid Arthritis Negative for: Gout; Osteoarthritis; Osteomyelitis Neurologic Medical History: Negative for: Dementia; Neuropathy; Quadriplegia; Paraplegia; Seizure Disorder Psychiatric Complaints and Symptoms: No Complaints or Symptoms Medical History: Negative for: Anorexia/bulimia; Confinement Anxiety HBO Extended History Items Eyes: Cataracts Immunizations Pneumococcal Vaccine: Received Pneumococcal Vaccination: Yes Implantable Devices Family and Social History JAVEN, MARIUS ([id number]) Cancer: No; Diabetes: Yes - Father; Heart Disease: Yes - Father; Hypertension: No;  Kidney Disease: Yes - Mother; Lung Disease: No; Seizures: No; Stroke: No; Thyroid Problems: No; Tuberculosis: No; Former smoker - 20 years stopped; Marital Status - Widowed; Alcohol Use: Rarely; Drug Use: No History; Caffeine Use: Daily; Financial Concerns: No; Food, Clothing or Shelter Needs: No; Support System Lacking: No; Transportation Concerns: No; Advanced Directives: Yes (Not Provided); Patient does not want information on Advanced Directives; Do not resuscitate: No; Living Will: Yes (Not Provided); Medical Power of Attorney: No Physician Affirmation I have reviewed and agree with the above information. Electronic Signature(s) Signed: 08/06/2018 8:12:43 AM By: Renne Crigler Signed: 08/06/2018 8:54:03 AM By: Lenda Kelp PA-C Entered By: Lenda Kelp on 08/05/2018 14:56:11 Kerrigan, Davon Rexene [name] ([id number]) -------------------------------------------------------------------------------- SuperBill Details Patient Name: [name]. Date of Service: 08/05/2018 Medical Record Number: [id number] Patient Account Number: [id number] Date of Birth/Sex: 1925-12-20 (84 y.o. F) Treating RN: Renne Crigler Primary Care Provider: Einar Crow Other Clinician: Referring Provider: Einar Crow Treating Provider/Extender: Linwood Dibbles, Anie Juniel Weeks in Treatment: 13 Diagnosis Coding ICD-10 Codes Code Description [phone] Pressure ulcer of left heel, stage 3 L89.94 Pressure ulcer of unspecified site, stage 4 I87.2 Venous insufficiency (chronic) (peripheral) N18.3 Chronic kidney disease, stage 3 (moderate) L08.9 Local infection of the skin and subcutaneous tissue, unspecified Physician Procedures CPT4 Code: [id number] Description: 99213 - WC PHYS LEVEL 3 - EST PT ICD-10 Diagnosis Description L89.623 Pressure ulcer of left heel, stage 3 L89.94 Pressure ulcer of unspecified site, stage 4 I87.2 Venous insufficiency (chronic) (peripheral) N18.3 Chronic kidney disease,  stage 3  (moderate) Modifier: Quantity: 1 Electronic Signature(s) Signed: 08/06/2018 5:59:42 PM By: Renne Crigler Signed: 08/06/2018 10:43:54 PM By: Lenda Kelp PA-C Previous Signature: 08/06/2018 8:54:03 AM Version By: Lenda Kelp PA-C Entered By: Renne Crigler on 08/06/2018 17:59:42

## 2018-08-18 ENCOUNTER — Non-Acute Institutional Stay (SKILLED_NURSING_FACILITY): Payer: Medicare Other | Admitting: Adult Health

## 2018-08-18 ENCOUNTER — Encounter: Payer: Self-pay | Admitting: Adult Health

## 2018-08-18 DIAGNOSIS — I10 Essential (primary) hypertension: Secondary | ICD-10-CM

## 2018-08-18 DIAGNOSIS — F32A Depression, unspecified: Secondary | ICD-10-CM

## 2018-08-18 DIAGNOSIS — M1A0710 Idiopathic chronic gout, right ankle and foot, without tophus (tophi): Secondary | ICD-10-CM | POA: Diagnosis not present

## 2018-08-18 DIAGNOSIS — E440 Moderate protein-calorie malnutrition: Secondary | ICD-10-CM

## 2018-08-18 DIAGNOSIS — K5903 Drug induced constipation: Secondary | ICD-10-CM

## 2018-08-18 DIAGNOSIS — F0390 Unspecified dementia without behavioral disturbance: Secondary | ICD-10-CM | POA: Diagnosis not present

## 2018-08-18 DIAGNOSIS — S82401D Unspecified fracture of shaft of right fibula, subsequent encounter for closed fracture with routine healing: Secondary | ICD-10-CM

## 2018-08-18 DIAGNOSIS — S82201D Unspecified fracture of shaft of right tibia, subsequent encounter for closed fracture with routine healing: Secondary | ICD-10-CM

## 2018-08-18 DIAGNOSIS — M159 Polyosteoarthritis, unspecified: Secondary | ICD-10-CM

## 2018-08-18 DIAGNOSIS — N184 Chronic kidney disease, stage 4 (severe): Secondary | ICD-10-CM

## 2018-08-18 DIAGNOSIS — H401130 Primary open-angle glaucoma, bilateral, stage unspecified: Secondary | ICD-10-CM

## 2018-08-18 DIAGNOSIS — I872 Venous insufficiency (chronic) (peripheral): Secondary | ICD-10-CM | POA: Diagnosis not present

## 2018-08-18 DIAGNOSIS — T402X5A Adverse effect of other opioids, initial encounter: Secondary | ICD-10-CM

## 2018-08-18 DIAGNOSIS — F329 Major depressive disorder, single episode, unspecified: Secondary | ICD-10-CM

## 2018-08-18 DIAGNOSIS — J302 Other seasonal allergic rhinitis: Secondary | ICD-10-CM

## 2018-08-18 DIAGNOSIS — M150 Primary generalized (osteo)arthritis: Secondary | ICD-10-CM

## 2018-08-18 NOTE — Progress Notes (Signed)
Location:   [facility] Room Number: 301 B Place of Service:  SNF (31)   CODE STATUS: DNR  No Known Allergies  Chief Complaint  Patient presents with  . Medical Management of Chronic Issues    Hypertension; venous insufficiency; dementia    HPI:  She is a 84 year old long term resident of this facility being seen for the management of her chronic illnesses: hypertension; venous sufficiency; dementia. She does have right side back pain; is chronic in nature but her current regimen is not effective in managing her pain. She denies any changes in her appetite and denies any insomnia.   Past Medical History:  Diagnosis Date  . Age-related osteoporosis with current pathol fx with routine healing 01/18/2018  . Anxiety   . Arthritis   . At risk for falls   . Balance problems   . Benign hypertensive CKD 01/18/2018  . Cataract   . Chronic kidney disease   . Dementia arising in the senium and presenium 01/18/2018  . Depression   . Disease of thyroid gland   . Frail elderly   . Gout   . Hypertension   . Impaired mobility   . Memory loss   . Neuromuscular disorder (HCC)   . Primary open angle glaucoma 09/11/2015  . Vision problems   . Visual impairment     Past Surgical History:  Procedure Laterality Date  . PARTIAL HYSTERECTOMY      Social History   Socioeconomic History  . Marital status: Married    Spouse name: Ivar Drape  . Number of children: 0  . Years of education:    . Highest education level: Not on file  Occupational History  . Not on file  Social Needs  . Financial resource strain: Not on file  . Food insecurity:    Worry: Not on file    Inability: Not on file  . Transportation needs:    Medical: Not on file    Non-medical: Not on file  Tobacco Use  . Smoking status: Former Games developer  . Smokeless tobacco: Never Used  Substance and Sexual Activity  . Alcohol use: No    Frequency: Never  . Drug use: No  . Sexual activity: Not  on file  Lifestyle  . Physical activity:    Days per week: Not on file    Minutes per session: Not on file  . Stress: Not on file  Relationships  . Social connections:    Talks on phone: Not on file    Gets together: Not on file    Attends religious service: Not on file    Active member of club or organization: Not on file    Attends meetings of clubs or organizations: Not on file    Relationship status: Not on file  . Intimate partner violence:    Fear of current or ex partner: Not on file    Emotionally abused: Not on file    Physically abused: Not on file    Forced sexual activity: Not on file  Other Topics Concern  . Not on file  Social History Narrative   Retired Garment/textile technologist   Married   No children   Former smoker   No smokeless tobacco   No alcohol use   Full Code    Family History  Problem Relation Age of Onset  . CAD Mother   . Kidney disease Father   . CAD Father  VITAL SIGNS BP (!) 113/56   Pulse 79   Temp 98.5 F (36.9 C)   Resp 14   Ht 5\' 4"  (1.626 m)   Wt 114 lb 9.6 oz (52 kg)   SpO2 99%   BMI 19.67 kg/m   Outpatient Encounter Medications as of 08/18/2018  Medication Sig  . acetaminophen (TYLENOL) 500 MG tablet Take 1,000 mg by mouth 3 (three) times daily.   08/20/2018 allopurinol (ZYLOPRIM) 100 MG tablet Take 100 mg by mouth daily.   . Amino Acids-Protein Hydrolys (FEEDING SUPPLEMENT, PRO-STAT SUGAR FREE 64,) LIQD Take 30 mLs by mouth 2 (two) times daily between meals.  Marland Kitchen aspirin EC 81 MG tablet Take 81 mg by mouth daily.  . Cholecalciferol 4000 units CAPS Take 4,000 Units by mouth daily.  . collagenase (SANTYL) ointment cleanse left heel wound with ns, apply santyl nickel thick to slough, cover with ns gauze and Allevyn dressing and also Cleanse right leg wound with ns, skin prep periwound, apply santyl to wound bed nickel thick, cover with ns gauze and foam dressing.  . cyanocobalamin 1000 MCG tablet Take 1,000 mcg by mouth daily.  . fluticasone  (FLONASE) 50 MCG/ACT nasal spray Place 2 sprays into both nostrils at bedtime.  . Infant Care Products [facility] EX) Apply liberal amount topically to area of skin irritation prn. OK to leave at bedside.  . latanoprost (XALATAN) 0.005 % ophthalmic solution Place 1 drop into both eyes at bedtime.  [facility] loratadine (CLARITIN) 10 MG tablet Take 10 mg by mouth daily.  Marland Kitchen losartan (COZAAR) 25 MG tablet Take 25 mg by mouth daily.  . magnesium hydroxide (MILK OF MAGNESIA) 400 MG/5ML suspension Take 30 mLs by mouth every 4 (four) hours as needed. Constipation/ no BM for 2 days  . metoprolol tartrate (LOPRESSOR) 50 MG tablet Take 1 tablet (50 mg total) by mouth 2 (two) times daily.  . mirtazapine (REMERON) 15 MG tablet Take 15 mg by mouth at bedtime.   . NON FORMULARY Magic Cup 2 times daily with lunch and dinner  . Nutritional Supplements (ENSURE ENLIVE PO) Take 1 Bottle by mouth 2 (two) times daily between meals.  . Nutritional Supplements (NUTRITIONAL SUPPLEMENT PO) Diet order - downgrade patient to dysphagia 3 diet, add bowl of gravy to each tray: continue with thin liquids  . oxyCODONE (OXY IR/ROXICODONE) 5 MG immediate release tablet Take 1 tablet (5 mg total) by mouth every 4 (four) hours as needed.  Marland Kitchen oxyCODONE (OXY IR/ROXICODONE) 5 MG immediate release tablet Take 1 tablet (5 mg total) by mouth 2 (two) times daily.  . polyethylene glycol (MIRALAX / GLYCOLAX) packet Take 17 g by mouth daily as needed. Mix with 4-8 ounces fluid-- for constipation   . sennosides-docusate sodium (SENOKOT-S) 8.6-50 MG tablet Take 2 tablets by mouth 2 (two) times daily.  . Skin Protectants, Misc. (NO-STING SKIN-PREP EX) Apply topically. Apply liberal amount to the tips of the toes and B-heels BID for skin protection  . zinc sulfate 220 (50 Zn) MG capsule Take 220 mg by mouth daily.   No facility-administered encounter medications on file as of 08/18/2018.      SIGNIFICANT DIAGNOSTIC EXAMS  LABS REVIEWED TODAY:    06-16-18: wbc 4.9; hgb 11.1; hct 33.2; mcv 100.3; plt 226; glucose 86; bun 38; creat 1.66; k+ 3.5; na++ 144 ca 9.0 liver normal albumin 3.1;  07-20-18: glucose 91; bun 48; creat 1.71; k+ 4.1; na++ 144; ca 9.2 PTH 42  Review of Systems  Constitutional: Negative  for malaise/fatigue.  Respiratory: Negative for cough and shortness of breath.   Cardiovascular: Negative for chest pain, palpitations and leg swelling.  Gastrointestinal: Negative for abdominal pain, constipation and heartburn.  Musculoskeletal: Positive for back pain. Negative for joint pain and myalgias.       On right side   Skin: Negative.   Neurological: Negative for dizziness.  Psychiatric/Behavioral: The patient is not nervous/anxious.     Physical Exam  Constitutional: She is oriented to person, place, and time. No distress.  Frail   Neck: No thyromegaly present.  Cardiovascular: Normal rate, regular rhythm, normal heart sounds and intact distal pulses.  Pulmonary/Chest: Effort normal and breath sounds normal. No respiratory distress.  Abdominal: Soft. Bowel sounds are normal. She exhibits no distension. There is no tenderness.  Musculoskeletal: She exhibits edema.  Is able to move all extremities Has trace lower extremity edema   Lymphadenopathy:    She has no cervical adenopathy.  Neurological: She is alert and oriented to person, place, and time.  Skin: Skin is warm and dry. She is not diaphoretic.  Psychiatric: She has a normal mood and affect.     ASSESSMENT/ PLAN:  TODAY:   1. Essential hypertension: is stable b/p 113/56 cozaar 25 mg daily and lopressor 50 mg twice daily   2.  Venous insufficiency of both lower extremities: will continue asa 81 mg daily   3. Dementia arising in the senium and presenium: is without change weight is 114 pounds; will monitor   4.  Chronic gout of right foot: is stable will continue allopurinol 100 mg daily   5. Primary osteoarthritis multiple joints: is stable will  continue tylenol 1 gm three times daily   6. Chronic kidney disease stage IV: is stable bun 48 creat 1.71  7. Tibia/fibula fracture, right, closed with routine healing subsequent encounter: is stable will continue tylenol 1 gm three times daily will change oxycodone 5 mg to three times daily and will keep every 4 hours prn.   8.  Bilateral open angle glaucoma: is stable will continue xalatan to both eyes nightly   9. Moderate protein-calorie malnutrition: albumin 3.1; will continue prostat and supplements as directed  10. Constipation due to opioid therapy: is stable will continue senna s 2 tabs twice daily ans miralax daily as needed  11. Chronic seasonal allergic rhinitis: is stable will continue claritin 10 mg daily and flonase daily   12. Chronic depression: is stable will continue remeron 15 mg nightly   13. Right side back pain: is worse; will continue tylenol 1 gm three times daily and will change oxycodone to 5 mg three times daily and every 4 hours as needed.   MD is aware of resident's narcotic use and is in agreement with current plan of care. We will attempt to wean resident as apropriate   Synthia Innocent NP [facility]  Contact [phone] Monday through Friday 8am- 5pm  After hours call [phone]

## 2018-08-19 ENCOUNTER — Encounter: Payer: Medicare Other | Admitting: Physician Assistant

## 2018-08-19 DIAGNOSIS — L89623 Pressure ulcer of left heel, stage 3: Secondary | ICD-10-CM | POA: Diagnosis not present

## 2018-08-22 NOTE — Progress Notes (Signed)
FIORELLA, HANAHAN ([id number]) Visit Report for 08/19/2018 Arrival Information Details Patient Name: [name], [name]. Date of Service: 08/19/2018 10:00 AM Medical Record Number: [id number] Patient Account Number: [id number] Date of Birth/Sex: 17-Dec-1925 (84 y.o. F) Treating RN: [name] Primary Care [name]: [name] Other Clinician: Referring [name]: [name] Treating [name]/Extender: [name], [name] Weeks in Treatment: 15 Visit Information History Since Last Visit Added or deleted any medications: No Patient Arrived: Wheel Chair Any new allergies or adverse reactions: No Arrival Time: 10:09 Had a fall or experienced change in No activities of daily living that may affect Accompanied By: sister risk of falls: Transfer Assistance: None Signs or symptoms of abuse/neglect since last visito No Patient Identification Verified: Yes Hospitalized since last visit: No Secondary Verification Process Completed: Yes Implantable device outside of the clinic excluding No Patient Requires Transmission-Based No cellular tissue based products placed in the center Precautions: since last visit: Patient Has Alerts: No Has Dressing in Place as Prescribed: Yes Pain Present Now: No Electronic Signature(s) Signed: 08/19/2018 2:29:34 PM By: [name] Entered By: [name] on 08/19/2018 10:10:40 [name], [name] ([id number]) -------------------------------------------------------------------------------- Clinic Level of Care Assessment Details Patient Name: [name]. Date of Service: 08/19/2018 10:00 AM Medical Record Number: [id number] Patient Account Number: [id number] Date of Birth/Sex: May 15, 1926 (84 y.o. F) Treating RN: [name] Primary Care Jazzie Trampe: [name] Other Clinician: Referring Taquilla Downum: [name] Treating Aunesti Pellegrino/Extender: [name], [name] Weeks in Treatment: 15 Clinic Level of Care Assessment Items TOOL 4  Quantity Score X - Use when only an EandM is performed on FOLLOW-UP visit 1 0 ASSESSMENTS - Nursing Assessment / Reassessment X - Reassessment of Co-morbidities (includes updates in patient status) 1 10 X- 1 5 Reassessment of Adherence to Treatment Plan ASSESSMENTS - Wound and Skin Assessment / Reassessment []  - Simple Wound Assessment / Reassessment - one wound 0 X- 2 5 Complex Wound Assessment / Reassessment - multiple wounds []  - 0 Dermatologic / Skin Assessment (not related to wound area) ASSESSMENTS - Focused Assessment []  - Circumferential Edema Measurements - multi extremities 0 []  - 0 Nutritional Assessment / Counseling / Intervention []  - 0 Lower Extremity Assessment (monofilament, tuning fork, pulses) []  - 0 Peripheral Arterial Disease Assessment (using hand held doppler) ASSESSMENTS - Ostomy and/or Continence Assessment and Care []  - Incontinence Assessment and Management 0 []  - 0 Ostomy Care Assessment and Management (repouching, etc.) PROCESS - Coordination of Care []  - Simple Patient / Family Education for ongoing care 0 X- 1 20 Complex (extensive) Patient / Family Education for ongoing care []  - 0 Staff obtains Chiropractor, Records, Test Results / Process Orders []  - 0 Staff telephones HHA, Nursing Homes / Clarify orders / etc []  - 0 Routine Transfer to another Facility (non-emergent condition) []  - 0 Routine Hospital Admission (non-emergent condition) []  - 0 New Admissions / Manufacturing engineer / Ordering NPWT, Apligraf, etc. []  - 0 Emergency Hospital Admission (emergent condition) []  - 0 Simple Discharge Coordination [name], [name] ([id number]) X- 1 15 Complex (extensive) Discharge Coordination PROCESS - Special Needs []  - Pediatric / Minor Patient Management 0 []  - 0 Isolation Patient Management []  - 0 Hearing / Language / Visual special needs []  - 0 Assessment of Community assistance (transportation, D/C planning, etc.) []  - 0 Additional  assistance / Altered mentation []  - 0 Support Surface(s) Assessment (bed, cushion, seat, etc.) INTERVENTIONS - Wound Cleansing / Measurement []  - Simple Wound Cleansing - one wound 0 X- 2 5 Complex Wound Cleansing -  multiple wounds X- 1 5 Wound Imaging (photographs - any number of wounds) []  - 0 Wound Tracing (instead of photographs) []  - 0 Simple Wound Measurement - one wound X- 2 5 Complex Wound Measurement - multiple wounds INTERVENTIONS - Wound Dressings []  - Small Wound Dressing one or multiple wounds 0 X- 2 15 Medium Wound Dressing one or multiple wounds []  - 0 Large Wound Dressing one or multiple wounds []  - 0 Application of Medications - topical []  - 0 Application of Medications - injection INTERVENTIONS - Miscellaneous []  - External ear exam 0 []  - 0 Specimen Collection (cultures, biopsies, blood, body fluids, etc.) []  - 0 Specimen(s) / Culture(s) sent or taken to Lab for analysis []  - 0 Patient Transfer (multiple staff / / Similar devices) []  - 0 Simple Staple / Suture removal (25 or less) []  - 0 Complex Staple / Suture removal (26 or more) []  - 0 Hypo / Hyperglycemic Management (close monitor of Blood Glucose) []  - 0 Ankle / Brachial Index (ABI) - do not check if billed separately X- 1 5 Vital Signs [name], [name] ( ) Has the patient been seen at the hospital within the last three years: Yes Total Score: 120 Level Of Care: New/Established - Level 4 Electronic Signature(s) Signed: 08/20/2018 4:22:48 PM By: Entered By: on 08/19/2018 10:34:22 [name], [name] ( ) -------------------------------------------------------------------------------- Complex / Palliative Patient Assessment Details Patient Name: [name], [name] Date of Service: 08/19/2018 10:00 AM Medical Record Number: Patient Account Number: Nurse, adult Date of Birth/Sex: February 21, 1926 (84 y.o. F) Treating RN:  Primary Care Chiquitta Matty: Other Clinician: Referring Lindsay [name] Treating [name]/Extender: 08/22/2018, [name] Weeks in Treatment: 15 Palliative Management Criteria Complex Wound Management Criteria Patient requires a surgical procedure in order to achieve wound healing: However, the patient does not wish to undergo the recommended surgical procedure. Care Approach Wound Care Plan: Complex Wound Management Electronic Signature(s) Signed: 08/20/2018 10:44:49 AM By: [name] Signed: 08/20/2018 5:13:23 PM By: Rexene Edison PA-C Entered By: [id number] on 08/20/2018 10:44:49 [name], [name] [id number] ([id number]) -------------------------------------------------------------------------------- Encounter Discharge Information Details Patient Name: 13/09/1926. Date of Service: 08/19/2018 10:00 AM Medical Record Number: [name] Patient Account Number: [name] Date of Birth/Sex: [birth year] (84 y.o. F) Treating RN: 08/22/2018 Primary Care Damia Bobrowski: [name] Other Clinician: Referring Shereda Graw: 08/22/2018 Treating Deari Sessler/Extender: Lenda Kelp, [name] Weeks in Treatment: 15 Encounter Discharge Information Items Discharge Condition: Stable Discharge Destination: Home Transportation: Private Auto Schedule Follow-up Appointment: Yes Clinical Summary of Care: Electronic Signature(s) Signed: 08/19/2018 11:49:05 AM By: 08/22/2018, BSN, RN, CWS, Kim RN, BSN Entered By: Rexene Edison, BSN, RN, CWS, Kim on 08/19/2018 11:49:05 [name], [name] (08/21/2018) -------------------------------------------------------------------------------- Lower Extremity Assessment Details Patient Name: [name], [name] Date of Service: 08/19/2018 10:00 AM Medical Record Number: [id number] Patient Account Number: 13/09/1926 Date of Birth/Sex: October 28, 1926 (84 y.o. F) Treating RN: [name] Primary Care Mya Suell: [name] Other  Clinician: Referring Ceceilia Cephus: [name] Treating Ladislav Caselli/Extender: 08/21/2018, [name] Weeks in Treatment: 15 Vascular Assessment Pulses: Dorsalis Pedis Palpable: [Left:Yes] [Right:Yes] Posterior Tibial Extremity colors, hair growth, and conditions: Extremity Color: [Left:Normal] [Right:Normal] Temperature of Extremity: [Left:Warm] [Right:Warm] Capillary Refill: [Left:< 3 seconds] [Right:< 3 seconds] Toe Nail Assessment Left: Right: Thick: Yes Yes Discolored: Yes Yes Deformed: No No Improper Length and Hygiene: No No Electronic Signature(s) Signed: 08/19/2018 2:29:34 PM By: Elliot Gurney Entered By: 08/21/2018 on 08/19/2018 10:20:58 [name], [name] (08/21/2018) -------------------------------------------------------------------------------- Multi Wound Chart Details  Patient Name: ILHAM, ROUGHTON. Date of Service: 08/19/2018 10:00 AM Medical Record Number: [id number] Patient Account Number: [id number] Date of Birth/Sex: [birth year] (84 y.o. F) Treating RN: [name] Primary Care Diyan Dave: [name] Other Clinician: Referring Rio Kidane: [name] Treating Tyshia Fenter/Extender: [name], [name] Weeks in Treatment: 15 Vital Signs Height(in): 62 Pulse(bpm): 79 Weight(lbs): Blood Pressure(mmHg): 161/59 Body Mass Index(BMI): Temperature(F): 98.0 Respiratory Rate 18 (breaths/min): Photos: [1:No Photos] [2:No Photos] [N/A:N/A] Wound Location: [1:Right Lower Leg - Posterior] [2:Left Calcaneus] [N/A:N/A] Wounding Event: [1:Pressure Injury] [2:Pressure Injury] [N/A:N/A] Primary Etiology: [1:Pressure Ulcer] [2:Pressure Ulcer] [N/A:N/A] Comorbid History: [1:Cataracts, Lymphedema, Hypertension, Rheumatoid Arthritis] [2:Cataracts, Lymphedema, Hypertension, Rheumatoid Arthritis] [N/A:N/A] Date Acquired: [1:02/08/2018] [2:04/10/2018] [N/A:N/A] Weeks of Treatment: [1:15] [2:15] [N/A:N/A] Wound Status: [1:Open] [2:Open] [N/A:N/A] Measurements L x W x D  [1:3x0.7x0.2] [2:0.2x0.2x0.2] [N/A:N/A] (cm) Area (cm) : [1:1.649] [2:0.031] [N/A:N/A] Volume (cm) : [1:0.33] [2:0.006] [N/A:N/A] % Reduction in Area: [1:40.00%] [2:80.30%] [N/A:N/A] % Reduction in Volume: [1:40.00%] [2:80.60%] [N/A:N/A] Classification: [1:Category/Stage IV] [2:Category/Stage III] [N/A:N/A] Exudate Amount: [1:Large] [2:Medium] [N/A:N/A] Exudate Type: [1:Serous] [2:Serous] [N/A:N/A] Exudate Color: [1:amber] [2:amber] [N/A:N/A] Wound Margin: [1:Flat and Intact] [2:Flat and Intact] [N/A:N/A] Granulation Amount: [1:Small (1-33%)] [2:None Present (0%)] [N/A:N/A] Granulation Quality: [1:Pink] [2:N/A] [N/A:N/A] Necrotic Amount: [1:Large (67-100%)] [2:Small (1-33%)] [N/A:N/A] Necrotic Tissue: [1:Eschar, Adherent Slough] [2:Eschar] [N/A:N/A] Exposed Structures: [1:Fat Layer (Subcutaneous Tissue) Exposed: Yes Tendon: Yes Fascia: No Muscle: No Joint: No Bone: No] [2:Fat Layer (Subcutaneous Tissue) Exposed: Yes Fascia: No Tendon: No Muscle: No Joint: No Bone: No] [N/A:N/A] Epithelialization: [1:Small (1-33%)] [2:Small (1-33%)] [N/A:N/A] Periwound Skin Texture: [1:Scarring: Yes Excoriation: No Induration: No Callus: No] [2:Excoriation: No Induration: No Callus: No Crepitus: No] [N/A:N/A] Crepitus: No Rash: No Rash: No Scarring: No Periwound Skin Moisture: Maceration: No Maceration: No N/A Dry/Scaly: No Dry/Scaly: No Periwound Skin Color: Atrophie Blanche: No Atrophie Blanche: No N/A Cyanosis: No Cyanosis: No Ecchymosis: No Ecchymosis: No Erythema: No Erythema: No Hemosiderin Staining: No Hemosiderin Staining: No Mottled: No Mottled: No Pallor: No Pallor: No Rubor: No Rubor: No Temperature: No Abnormality No Abnormality N/A Tenderness on Palpation: Yes No N/A Wound Preparation: Ulcer Cleansing: Ulcer Cleansing: N/A Rinsed/Irrigated with Saline Rinsed/Irrigated with Saline Topical Anesthetic Applied: Topical Anesthetic Applied: Other: lidocaine 4% Other:  lidocaine 4% Treatment Notes Electronic Signature(s) Signed: 08/20/2018 4:22:48 PM By: [name] Entered By: [name] on 08/19/2018 10:26:28 [name], [name] ([id number]) -------------------------------------------------------------------------------- Multi-Disciplinary Care Plan Details Patient Name: [name]. Date of Service: 08/19/2018 10:00 AM Medical Record Number: [id number] Patient Account Number: [id number] Date of Birth/Sex: [birth year] (84 y.o. F) Treating RN: [name] Primary Care Tildon Silveria: [name] Other Clinician: Referring Rigdon Macomber: [name] Treating Hilde Churchman/Extender: [name], [name] Weeks in Treatment: 15 Active Inactive ` Abuse / Safety / Falls / Self Care Management Nursing Diagnoses: Impaired physical mobility Goals: Patient will not experience any injury related to falls Date Initiated: 04/30/2018 Target Resolution Date: 05/29/2018 Goal Status: Active Interventions: Assess fall risk on admission and as needed Notes: ` Nutrition Nursing Diagnoses: Potential for alteratiion in Nutrition/Potential for imbalanced nutrition Goals: Patient/caregiver agrees to and verbalizes understanding of need to use nutritional supplements and/or vitamins as prescribed Date Initiated: 04/30/2018 Target Resolution Date: 07/16/2018 Goal Status: Active Interventions: Assess patient nutrition upon admission and as needed per policy Notes: ` Orientation to the Wound Care Program Nursing Diagnoses: Knowledge deficit related to the wound healing center program Goals: Patient/caregiver will verbalize understanding of the Wound Healing Center Program Date Initiated: 04/30/2018 Target Resolution Date: 07/30/2018 Goal Status: Active Interventions: [name], [name] ([id number])  Provide education on orientation to the wound center Notes: ` Wound/Skin Impairment Nursing Diagnoses: Impaired tissue integrity Goals: Ulcer/skin  breakdown will heal within 14 weeks Date Initiated: 04/30/2018 Target Resolution Date: 07/30/2018 Goal Status: Active Interventions: Assess patient/caregiver ability to obtain necessary supplies Assess patient/caregiver ability to perform ulcer/skin care regimen upon admission and as needed Assess ulceration(s) every visit Notes: Electronic Signature(s) Signed: 08/20/2018 4:22:48 PM By: [name] Entered By: [name] on 08/19/2018 10:26:20 [name], [name] ([id number]) -------------------------------------------------------------------------------- Pain Assessment Details Patient Name: [name]. Date of Service: 08/19/2018 10:00 AM Medical Record Number: [id number] Patient Account Number: [id number] Date of Birth/Sex: 1926/07/05 (84 y.o. F) Treating RN: [name] Primary Care Jannice Beitzel: [name] Other Clinician: Referring Mylo Choi: [name] Treating Ines [name]/Extender: [name], [name] Weeks in Treatment: 15 Active Problems Location of Pain Severity and Description of Pain Patient Has Paino No Site Locations Pain Management and Medication Current Pain Management: Goals for Pain Management pt denies any pain at this time. Electronic Signature(s) Signed: 08/19/2018 2:29:34 PM By: [name] Entered By: [name] on 08/19/2018 10:11:03 [name] ([id number]) -------------------------------------------------------------------------------- Patient/Caregiver Education Details Patient Name: [name]. Date of Service: 08/19/2018 10:00 AM Medical Record Number: [id number] Patient Account Number: [id number] Date of Birth/Gender: 1926-05-14 (84 y.o. F) Treating RN: [name] Primary Care Physician: [name] Other Clinician: Referring Physician: Einar [name] Treating Physician/Extender: [name], [name] Weeks in Treatment: 15 Education Assessment Education Provided To: Patient Education Topics  Provided Wound/Skin Impairment: Handouts: Caring for Your Ulcer Methods: Explain/Verbal Responses: State content correctly Electronic Signature(s) Signed: 08/20/2018 4:22:48 PM By: [name] Entered By: [name] on 08/19/2018 10:34:41 Aydelotte, Kalia Rexene Edison ([id number]) -------------------------------------------------------------------------------- Wound Assessment Details Patient Name: [name], [name] Date of Service: 08/19/2018 10:00 AM Medical Record Number: [id number] Patient Account Number: [id number] Date of Birth/Sex: 06-27-1926 (84 y.o. F) Treating RN: [name] Primary Care Rai Sinagra: [name] Other Clinician: Referring Kymari Lollis: [name] Treating Divon Krabill/Extender: [name], [name] Weeks in Treatment: 15 Wound Status Wound Number: 1 Primary Pressure Ulcer Etiology: Wound Location: Right Lower Leg - Posterior Wound Status: Open Wounding Event: Pressure Injury Comorbid Cataracts, Lymphedema, Hypertension, Date Acquired: 02/08/2018 History: Rheumatoid Arthritis Weeks Of Treatment: 15 Clustered Wound: No Photos Photo Uploaded By: [name] on 08/19/2018 10:41:40 Wound Measurements Length: (cm) 3 Width: (cm) 0.7 Depth: (cm) 0.2 Area: (cm) 1.649 Volume: (cm) 0.33 % Reduction in Area: 40% % Reduction in Volume: 40% Epithelialization: Small (1-33%) Tunneling: No Undermining: No Wound Description Classification: Category/Stage IV Foul Odor A Wound Margin: Flat and Intact Slough/Fibr Exudate Amount: Large Exudate Type: Serous Exudate Color: amber fter Cleansing: No ino Yes Wound Bed Granulation Amount: Small (1-33%) Exposed Structure Granulation Quality: Pink Fascia Exposed: No Necrotic Amount: Large (67-100%) Fat Layer (Subcutaneous Tissue) Exposed: Yes Necrotic Quality: Eschar, Adherent Slough Tendon Exposed: Yes Muscle Exposed: No Joint Exposed: No Bone Exposed: No Periwound Skin Texture Gortney, Alaysha H.  ([id number]) Texture Color No Abnormalities Noted: No No Abnormalities Noted: No Callus: No Atrophie Blanche: No Crepitus: No Cyanosis: No Excoriation: No Ecchymosis: No Induration: No Erythema: No Rash: No Hemosiderin Staining: No Scarring: Yes Mottled: No Pallor: No Moisture Rubor: No No Abnormalities Noted: No Dry / Scaly: No Temperature / Pain Maceration: No Temperature: No Abnormality Tenderness on Palpation: Yes Wound Preparation Ulcer Cleansing: Rinsed/Irrigated with Saline Topical Anesthetic Applied: Other: lidocaine 4%, Treatment Notes Wound #1 (Right, Posterior Lower Leg) 4. Dressing Applied: Prisma Ag 5. Secondary Dressing Applied Bordered Foam Dressing Electronic Signature(s) Signed: 08/19/2018 2:29:34  PM By: [name] Entered By: [name] on 08/19/2018 10:17:11 Storbeck, [name] ([id number]) -------------------------------------------------------------------------------- Wound Assessment Details Patient Name: [name], [name] Date of Service: 08/19/2018 10:00 AM Medical Record Number: [id number] Patient Account Number: [id number] Date of Birth/Sex: Jun 19, 1926 (84 y.o. F) Treating RN: [name] Primary Care Nakaya Mishkin: [name] Other Clinician: Referring Janeka Libman: [name] Treating Jameir Ake/Extender: [name], [name] Weeks in Treatment: 15 Wound Status Wound Number: 2 Primary Pressure Ulcer Etiology: Wound Location: Left Calcaneus Wound Status: Open Wounding Event: Pressure Injury Comorbid Cataracts, Lymphedema, Hypertension, Date Acquired: 04/10/2018 History: Rheumatoid Arthritis Weeks Of Treatment: 15 Clustered Wound: No Photos Photo Uploaded By: [name] on 08/19/2018 10:41:41 Wound Measurements Length: (cm) 0.2 Width: (cm) 0.2 Depth: (cm) 0.2 Area: (cm) 0.031 Volume: (cm) 0.006 % Reduction in Area: 80.3% % Reduction in Volume: 80.6% Epithelialization: Small (1-33%) Tunneling: No Undermining: No Wound  Description Classification: Category/Stage III Foul Odor A Wound Margin: Flat and Intact Slough/Fibr Exudate Amount: Medium Exudate Type: Serous Exudate Color: amber fter Cleansing: No ino Yes Wound Bed Granulation Amount: None Present (0%) Exposed Structure Necrotic Amount: Small (1-33%) Fascia Exposed: No Necrotic Quality: Eschar Fat Layer (Subcutaneous Tissue) Exposed: Yes Tendon Exposed: No Muscle Exposed: No Joint Exposed: No Bone Exposed: No Periwound Skin Texture [name], [name] ([id number]) Texture Color No Abnormalities Noted: Yes No Abnormalities Noted: No Atrophie Blanche: No Moisture Cyanosis: No No Abnormalities Noted: No Ecchymosis: No Dry / Scaly: No Erythema: No Maceration: No Hemosiderin Staining: No Mottled: No Pallor: No Rubor: No Temperature / Pain Temperature: No Abnormality Wound Preparation Ulcer Cleansing: Rinsed/Irrigated with Saline Topical Anesthetic Applied: Other: lidocaine 4%, Treatment Notes Wound #2 (Left Calcaneus) 4. Dressing Applied: Prisma Ag 5. Secondary Dressing Applied Bordered Foam Dressing Electronic Signature(s) Signed: 08/19/2018 2:29:34 PM By: [name] Entered By: [name] on 08/19/2018 10:19:53 [name], [name] ([id number]) -------------------------------------------------------------------------------- Vitals Details Patient Name: [name]. Date of Service: 08/19/2018 10:00 AM Medical Record Number: [id number] Patient Account Number: [id number] Date of Birth/Sex: November 14, 1926 (84 y.o. F) Treating RN: [name] Primary Care Lucina Betty: [name] Other Clinician: Referring Sueanne Maniaci: [name] Treating Gitty Osterlund/Extender: [name], [name] Weeks in Treatment: 15 Vital Signs Time Taken: 10:11 Temperature (F): 98.0 Height (in): 62 Pulse (bpm): 79 Respiratory Rate (breaths/min): 18 Blood Pressure (mmHg): 161/59 Reference Range: 80 - 120 mg / dl Electronic Signature(s) Signed:  08/19/2018 2:29:34 PM By: [name] Entered By: [name] on 08/19/2018 10:11:33

## 2018-08-22 NOTE — Progress Notes (Signed)
[name] ([id number]) Visit Report for 08/19/2018 Chief Complaint Document Details Patient Name: [name], [name]. Date of Service: 08/19/2018 10:00 AM Medical Record Number: [id number] Patient Account Number: [id number] Date of Birth/Sex: Apr 20, 1926 (84 y.o. F) Treating RN: Renne Crigler Primary Care Provider: Einar Crow Other Clinician: Referring Provider: Einar Crow Treating Provider/Extender: Linwood Dibbles, Anyla Israelson Weeks in Treatment: 15 Information Obtained from: Patient Chief Complaint She is here for multiple wounds Electronic Signature(s) Signed: 08/19/2018 3:07:42 PM By: Lenda Kelp PA-C Entered By: Lenda Kelp on 08/19/2018 10:08:44 [name] ([id number]) -------------------------------------------------------------------------------- HPI Details Patient Name: [name]. Date of Service: 08/19/2018 10:00 AM Medical Record Number: [id number] Patient Account Number: [id number] Date of Birth/Sex: [birth year] (84 y.o. F) Treating RN: Renne Crigler Primary Care Provider: Einar Crow Other Clinician: Referring Provider: Einar Crow Treating Provider/Extender: Linwood Dibbles, Albertha Beattie Weeks in Treatment: 15 History of Present Illness HPI Description: 04/30/18- She is here for initial evaluation of right posterior calf and left heel pressure ulcers. She has a documented history of dementia but presents today alert and oriented o3, confused with time frame/dates pertaining to recent injury, hospitalization and placementotherwise she appears appropriate to make decisions at today's visit. On 01/13/18 she was admitted to [facility] after a fall, she sustained a closed fracture to the proximal tib-fib on the right side. She was treated nonsurgically with a full-length leg immobilizer/brace and NWB status. She began to complain of pain to the posterior aspect of the right leg, on 5/21 she was noted to have a  pressure injury, the patient states that she was exchanged to a shorter knee immobilizer on Tuesday 6/4. This is currently being treated with Santyl at the nursing home, she was transitioned from rehabilitation to long-term care at [facility] [place]. She also presents with a left heel pressure injury of unknown chronicity; there is no record of it in nursing home documentation. She had a blister noted to the left great toe, according to nursing home documentation, on 3/7. This now appears as a reabsorbing blood blister/deep tissue injury and is not currently open. She denies wearing any heel open offloading boots for heel pressure relief. She has been advised to keep pressure off of left heel and right posterior leg at all times; offloading heel boots have been ordered for facility to provide. She is currently on Bactrim for culture (5/31) with trimeth/sulfa sensitive staph species (coag neg). She does admit to loose stools, 1-3 per day; will monitor next week for improvement and consider testing for c-diff. She is a DNR/Pallitive care per facility records. She does have reduced ABI in office today and we discussed the potential for delayed healing given this result, the risk for deterioration given the level and possible need for future testing/intervention. It is her decision that we treat conservatively at this time, not sending her for additional tests. 05/07/18 on evaluation today patient appears to be doing somewhat better I feel in regard to her ulcers on the left heel and the right lateral leg. She's been tolerating the dressing changes without complication with that being said she continues to have some discomfort she is also still recovering from her tib/fib fracture on the right. She has additional swelling of the right lower extremity secondary to this. No fevers, chills, nausea, or vomiting noted at this time. 05/18/18 on evaluation today patient's wound on the left heel actually  appears to be doing better. Unfortunately the wound on the right lateral lower extremity actually appears  to be about the same maybe even a little bit more bruising noted today. Unfortunately. With that being said I think the main issue is that she's getting pressure to the site when she actually is sitting in her wheelchair which she does for most of the day according what she tells me today. I do think that a Prevalon Boots may help to offload this to a degree and this is something that I did recommend for her today. We have previously it looks like order bilateral Prevalon Boots but nonetheless she has one for the left not for the right she tells me. We were going to see if we can work on that. 06/04/18 on evaluation today patient actually appears to be doing better in regard to left heel ulcer that was actually some dead skin surrounding it appeared to be somewhat macerated I think she may tolerate some light debridement here in order to allow for a different dressing possibly Prisma to be utilized. In regard to the right lateral lower extremity I think that Santyl will likely still be the most appropriate dressing. 06/17/18-She is seen in follow-up evaluation for right posterior calf and left posterior heel ulcer. 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 She continues to complain of significant pain to the left posterior heel. She is tolerating dressing changes which we will continue 06/24/18-she is seen in follow-up evaluation for right posterior calf and left posterior heel ulcer. The right posterior calf ulcer is stable/improved with any nonviable tissue, tendon remains exposed. The left posterior heel is improved with pain. She admits to compliance with wearing offloading boots at night while in bed. We will continue with same treatment plan she can follow- up in 2 weeks 07/09/18 on evaluation today patient appears to be doing decently well in regard to her wounds in my  pinion. She has been tolerating the dressing changes as well as debridement with only minimal discomfort. Her right lateral lower Trinity seems to be the worst areas for debridement is concerned. Fortunately there's no evidence of infection at this point in time. Her heel Cummiskey, Bernadetta H. ([id number]) still has some depth to it were using Prisma at this point. 07/22/18 on evaluation today patient's wounds actually seem to be showing some signs of improvement especially in regard to the right lateral lower extremity. With that being said the left heel is not making quite as much improvement as I would like. I do believe that we may need to switch the dressing to Santyl at the site. 08/05/18 on evaluation today patient actually appears to be doing rather well in regard to her right lateral lower Trinity ulcer. Her left heel ulcer is actually doing about the same. She's been tolerating the dressing changes. 08/19/18 on evaluation today patient's wound on the right lateral lower extremity in the left heel both appear to be doing better. She continues to heal although slowly at this point. Unfortunately she does have peripheral vascular disease again her ABI 0.61 of the left and 0.72 on the right. She does not however want to just anyone from a vascular surgery standpoint I did discuss this with the patient and her sister again today during the office visit. Nonetheless I think with more appropriate blood flow this would likely heal faster nonetheless again if the patient is not ready to proceed in that regard then we will definitely hold to a future point. If she changes her mind we can always make the referral for  her. Electronic Signature(s) Signed: 08/19/2018 3:07:42 PM By: Lenda Kelp PA-C Entered By: Lenda Kelp on 08/19/2018 13:39:01 [name], [name] ([id number]) -------------------------------------------------------------------------------- Physical Exam Details Patient Name:  Strassner, Manvir H. Date of Service: 08/19/2018 10:00 AM Medical Record Number: [id number] Patient Account Number: [id number] Date of Birth/Sex: Apr 09, 1926 (84 y.o. F) Treating RN: Renne Crigler Primary Care Provider: Einar Crow Other Clinician: Referring Provider: Einar Crow Treating Provider/Extender: STONE III, Aaleyah Witherow Weeks in Treatment: 15 Constitutional Well-nourished and well-hydrated in no acute distress. Respiratory normal breathing without difficulty. clear to auscultation bilaterally. Cardiovascular regular rate and rhythm with normal S1, S2. Psychiatric this patient is able to make decisions and demonstrates good insight into disease process. Alert and Oriented x 3. pleasant and cooperative. Notes On evaluation today patient's wound bed actually shows signs of improvement both in regard to left heel as well as the right lateral lower extremity. I think were actually at a point where we may be able to switch over to a collagen-based dressing to see if we can help with additional epithelialization at this time. I did mechanically debride the wound with saline and gauze as best I could today at both sites. Electronic Signature(s) Signed: 08/19/2018 3:07:42 PM By: Lenda Kelp PA-C Entered By: Lenda Kelp on 08/19/2018 13:39:36 Kirschbaum, [name] ([id number]) -------------------------------------------------------------------------------- Physician Orders Details Patient Name: [name]. Date of Service: 08/19/2018 10:00 AM Medical Record Number: [id number] Patient Account Number: [id number] Date of Birth/Sex: 12-Mar-1926 (84 y.o. F) Treating RN: Renne Crigler Primary Care Provider: Einar Crow Other Clinician: Referring Provider: Einar Crow Treating Provider/Extender: Linwood Dibbles, Ferris Tally Weeks in Treatment: 15 Verbal / Phone Orders: No Diagnosis Coding ICD-10 Coding Code Description [phone] Pressure ulcer of left heel,  stage 3 L89.94 Pressure ulcer of unspecified site, stage 4 I87.2 Venous insufficiency (chronic) (peripheral) N18.3 Chronic kidney disease, stage 3 (moderate) L08.9 Local infection of the skin and subcutaneous tissue, unspecified Wound Cleansing Wound #1 Right,Posterior Lower Leg o Clean wound with Normal Saline. o Cleanse wound with mild soap and water o May Shower, gently pat wound dry prior to applying new dressing. Wound #2 Left Calcaneus o Clean wound with Normal Saline. o Cleanse wound with mild soap and water o May Shower, gently pat wound dry prior to applying new dressing. Anesthetic (add to Medication List) Wound #1 Right,Posterior Lower Leg o Topical Lidocaine 4% cream applied to wound bed prior to debridement (In Clinic Only). - at [facility] only Wound #2 Left Calcaneus o Topical Lidocaine 4% cream applied to wound bed prior to debridement (In Clinic Only). - at [facility] only Primary Wound Dressing Wound #1 Right,Posterior Lower Leg o Silver Collagen - moisten with saline Wound #2 Left Calcaneus o Silver Collagen - moisten with saline Secondary Dressing Wound #1 Right,Posterior Lower Leg o Boardered Foam Dressing Wound #2 Left Calcaneus o Boardered Foam Dressing Dressing Change Frequency Wound #1 Right,Posterior Lower Leg Hannibal, Sharman H. ([id number]) o Change dressing every other day. Wound #2 Left Calcaneus o Change dressing every other day. Follow-up Appointments Wound #1 Right,Posterior Lower Leg o Return Appointment in 2 weeks. Wound #2 Left Calcaneus o Return Appointment in 2 weeks. Edema Control Wound #1 Right,Posterior Lower Leg o Elevate legs to the level of the heart and pump ankles as often as possible Wound #2 Left Calcaneus o Elevate legs to the level of the heart and pump ankles as often as possible Off-Loading Wound #1 Right,Posterior Lower Leg o Other: - PLEASE PROVIDE PATIENT WITH BILATERAL  PREVALON  BOOTS FOR PATIENT TO WEAR AT ALL TIMES WHILE IN BED AND IN [facility] Wound #2 Left Calcaneus o Other: - PLEASE PROVIDE PATIENT WITH BILATERAL PREVALON BOOTS FOR PATIENT TO WEAR AT ALL TIMES WHILE IN BED AND IN [facility] Additional Orders / Instructions Wound #1 Right,Posterior Lower Leg o Increase protein intake. o Activity as tolerated o Other: - Please add facility wound healing vitamins per facility protocol Wound #2 Left Calcaneus o Increase protein intake. o Activity as tolerated o Other: - Please add facility wound healing vitamins per facility protocol Electronic Signature(s) Signed: 08/19/2018 3:07:42 PM By: Lenda Kelp PA-C Signed: 08/20/2018 4:22:48 PM By: Renne Crigler Entered By: Renne Crigler on 08/19/2018 10:33:10 [name], Lindsay ([id number]) -------------------------------------------------------------------------------- Problem List Details Patient Name: [name], [name] Date of Service: 08/19/2018 10:00 AM Medical Record Number: [id number] Patient Account Number: [id number] Date of Birth/Sex: December 24, 1925 (84 y.o. F) Treating RN: Renne Crigler Primary Care Provider: Einar Crow Other Clinician: Referring Provider: Einar Crow Treating Provider/Extender: Linwood Dibbles, Makynzi Eastland Weeks in Treatment: 15 Active Problems ICD-10 Evaluated Encounter Code Description Active Date Today Diagnosis L89.623 Pressure ulcer of left heel, stage 3 04/30/2018 No Yes L89.94 Pressure ulcer of unspecified site, stage 4 04/30/2018 No Yes I87.2 Venous insufficiency (chronic) (peripheral) 04/30/2018 No Yes N18.3 Chronic kidney disease, stage 3 (moderate) 04/30/2018 No Yes L08.9 Local infection of the skin and subcutaneous tissue, 04/30/2018 No Yes unspecified Inactive Problems Resolved Problems Electronic Signature(s) Signed: 08/19/2018 3:07:42 PM By: Lenda Kelp PA-C Entered By: Lenda Kelp on 08/19/2018 10:08:37 [name], Lindsay Rexene Edison  ([id number]) -------------------------------------------------------------------------------- Progress Note Details Patient Name: [name]. Date of Service: 08/19/2018 10:00 AM Medical Record Number: [id number] Patient Account Number: [id number] Date of Birth/Sex: February 09, 1926 (84 y.o. F) Treating RN: Renne Crigler Primary Care Provider: Einar Crow Other Clinician: Referring Provider: Einar Crow Treating Provider/Extender: Linwood Dibbles, Gawain Crombie Weeks in Treatment: 15 Subjective Chief Complaint Information obtained from Patient She is here for multiple wounds History of Present Illness (HPI) 04/30/18- She is here for initial evaluation of right posterior calf and left heel pressure ulcers. She has a documented history of dementia but presents today alert and oriented fo3, confused with time frame/dates pertaining to recent injury, hospitalization and placementotherwise she appears appropriate to make decisions at today's visit. On 01/13/18 she was admitted to [facility] after a fall, she sustained a closed fracture to the proximal tib-fib on the right side. She was treated nonsurgically with a full-length leg immobilizer/brace and NWB status. She began to complain of pain to the posterior aspect of the right leg, on 5/21 she was noted to have a pressure injury, the patient states that she was exchanged to a shorter knee immobilizer on Tuesday 6/4. This is currently being treated with Santyl at the nursing home, she was transitioned from rehabilitation to long-term care at [facility] [place]. She also presents with a left heel pressure injury of unknown chronicity; there is no record of it in nursing home documentation. She had a blister noted to the left great toe, according to nursing home documentation, on 3/7. This now appears as a reabsorbing blood blister/deep tissue injury and is not currently open. She denies wearing any heel open offloading  boots for heel pressure relief. She has been advised to keep pressure off of left heel and right posterior leg at all times; offloading heel boots have been ordered for facility to provide. She is currently on Bactrim for culture (5/31) with trimeth/sulfa sensitive staph  species (coag neg). She does admit to loose stools, 1-3 per day; will monitor next week for improvement and consider testing for c-diff. She is a DNR/Pallitive care per facility records. She does have reduced ABI in office today and we discussed the potential for delayed healing given this result, the risk for deterioration given the level and possible need for future testing/intervention. It is her decision that we treat conservatively at this time, not sending her for additional tests. 05/07/18 on evaluation today patient appears to be doing somewhat better I feel in regard to her ulcers on the left heel and the right lateral leg. She's been tolerating the dressing changes without complication with that being said she continues to have some discomfort she is also still recovering from her tib/fib fracture on the right. She has additional swelling of the right lower extremity secondary to this. No fevers, chills, nausea, or vomiting noted at this time. 05/18/18 on evaluation today patient's wound on the left heel actually appears to be doing better. Unfortunately the wound on the right lateral lower extremity actually appears to be about the same maybe even a little bit more bruising noted today. Unfortunately. With that being said I think the main issue is that she's getting pressure to the site when she actually is sitting in her wheelchair which she does for most of the day according what she tells me today. I do think that a Prevalon Boots may help to offload this to a degree and this is something that I did recommend for her today. We have previously it looks like order bilateral Prevalon Boots but nonetheless she has one for the  left not for the right she tells me. We were going to see if we can work on that. 06/04/18 on evaluation today patient actually appears to be doing better in regard to left heel ulcer that was actually some dead skin surrounding it appeared to be somewhat macerated I think she may tolerate some light debridement here in order to allow for a different dressing possibly Prisma to be utilized. In regard to the right lateral lower extremity I think that Santyl will likely still be the most appropriate dressing. 06/17/18-She is seen in follow-up evaluation for right posterior calf and left posterior heel ulcer. 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 She continues to complain of significant pain to the left posterior heel. She is tolerating dressing changes which we will continue 06/24/18-she is seen in follow-up evaluation for right posterior calf and left posterior heel ulcer. The right posterior calf ulcer is stable/improved with any nonviable tissue, tendon remains exposed. The left posterior heel is improved with pain. She admits EFFA, YARROW. ([id number]) to compliance with wearing offloading boots at night while in bed. We will continue with same treatment plan she can follow- up in 2 weeks 07/09/18 on evaluation today patient appears to be doing decently well in regard to her wounds in my pinion. She has been tolerating the dressing changes as well as debridement with only minimal discomfort. Her right lateral lower Trinity seems to be the worst areas for debridement is concerned. Fortunately there's no evidence of infection at this point in time. Her heel still has some depth to it were using Prisma at this point. 07/22/18 on evaluation today patient's wounds actually seem to be showing some signs of improvement especially in regard to the right lateral lower extremity. With that being said the left heel is not making quite  as much improvement as I would like. I do  believe that we may need to switch the dressing to Santyl at the site. 08/05/18 on evaluation today patient actually appears to be doing rather well in regard to her right lateral lower Trinity ulcer. Her left heel ulcer is actually doing about the same. She's been tolerating the dressing changes. 08/19/18 on evaluation today patient's wound on the right lateral lower extremity in the left heel both appear to be doing better. She continues to heal although slowly at this point. Unfortunately she does have peripheral vascular disease again her ABI 0.61 of the left and 0.72 on the right. She does not however want to just anyone from a vascular surgery standpoint I did discuss this with the patient and her sister again today during the office visit. Nonetheless I think with more appropriate blood flow this would likely heal faster nonetheless again if the patient is not ready to proceed in that regard then we will definitely hold to a future point. If she changes her mind we can always make the referral for her. Patient History Information obtained from Patient. Family History Diabetes - Father, Heart Disease - Father, Kidney Disease - Mother, No family history of Cancer, Hypertension, Lung Disease, Seizures, Stroke, Thyroid Problems, Tuberculosis. Social History Former smoker - 20 years stopped, Marital Status - Widowed, Alcohol Use - Rarely, Drug Use - No History, Caffeine Use - Daily. Review of Systems (ROS) Constitutional Symptoms (General Health) Denies complaints or symptoms of Fever, Chills. Respiratory The patient has no complaints or symptoms. Cardiovascular The patient has no complaints or symptoms. Psychiatric The patient has no complaints or symptoms. Objective Constitutional Well-nourished and well-hydrated in no acute distress. Vitals Time Taken: 10:11 AM, Height: 62 in, Temperature: 98.0 F, Pulse: 79 bpm, Respiratory Rate: 18 breaths/min, Blood Pressure: 161/59  mmHg. MONSERRATE, BLASCHKE. ([id number]) Respiratory normal breathing without difficulty. clear to auscultation bilaterally. Cardiovascular regular rate and rhythm with normal S1, S2. Psychiatric this patient is able to make decisions and demonstrates good insight into disease process. Alert and Oriented x 3. pleasant and cooperative. General Notes: On evaluation today patient's wound bed actually shows signs of improvement both in regard to left heel as well as the right lateral lower extremity. I think were actually at a point where we may be able to switch over to a collagen- based dressing to see if we can help with additional epithelialization at this time. I did mechanically debride the wound with saline and gauze as best I could today at both sites. Integumentary (Hair, Skin) Wound #1 status is Open. Original cause of wound was Pressure Injury. The wound is located on the Right,Posterior Lower Leg. The wound measures 3cm length x 0.7cm width x 0.2cm depth; 1.649cm^2 area and 0.33cm^3 volume. There is tendon and Fat Layer (Subcutaneous Tissue) Exposed exposed. There is no tunneling or undermining noted. There is a large amount of serous drainage noted. The wound margin is flat and intact. There is small (1-33%) pink granulation within the wound bed. There is a large (67-100%) amount of necrotic tissue within the wound bed including Eschar and Adherent Slough. The periwound skin appearance exhibited: Scarring. The periwound skin appearance did not exhibit: Callus, Crepitus, Excoriation, Induration, Rash, Dry/Scaly, Maceration, Atrophie Blanche, Cyanosis, Ecchymosis, Hemosiderin Staining, Mottled, Pallor, Rubor, Erythema. Periwound temperature was noted as No Abnormality. The periwound has tenderness on palpation. Wound #2 status is Open. Original cause of wound was Pressure Injury. The wound is located on the  Left Calcaneus. The wound measures 0.2cm length x 0.2cm width x 0.2cm depth;  0.031cm^2 area and 0.006cm^3 volume. There is Fat Layer (Subcutaneous Tissue) Exposed exposed. There is no tunneling or undermining noted. There is a medium amount of serous drainage noted. The wound margin is flat and intact. There is no granulation within the wound bed. There is a small (1-33%) amount of necrotic tissue within the wound bed including Eschar. The periwound skin appearance had no abnormalities noted for texture. The periwound skin appearance did not exhibit: Dry/Scaly, Maceration, Atrophie Blanche, Cyanosis, Ecchymosis, Hemosiderin Staining, Mottled, Pallor, Rubor, Erythema. Periwound temperature was noted as No Abnormality. Assessment Active Problems ICD-10 Pressure ulcer of left heel, stage 3 Pressure ulcer of unspecified site, stage 4 Venous insufficiency (chronic) (peripheral) Chronic kidney disease, stage 3 (moderate) Local infection of the skin and subcutaneous tissue, unspecified Plan [name], [name] ([id number]) Wound Cleansing: Wound #1 Right,Posterior Lower Leg: Clean wound with Normal Saline. Cleanse wound with mild soap and water May Shower, gently pat wound dry prior to applying new dressing. Wound #2 Left Calcaneus: Clean wound with Normal Saline. Cleanse wound with mild soap and water May Shower, gently pat wound dry prior to applying new dressing. Anesthetic (add to Medication List): Wound #1 Right,Posterior Lower Leg: Topical Lidocaine 4% cream applied to wound bed prior to debridement (In Clinic Only). - at [facility] only Wound #2 Left Calcaneus: Topical Lidocaine 4% cream applied to wound bed prior to debridement (In Clinic Only). - at [facility] only Primary Wound Dressing: Wound #1 Right,Posterior Lower Leg: Silver Collagen - moisten with saline Wound #2 Left Calcaneus: Silver Collagen - moisten with saline Secondary Dressing: Wound #1 Right,Posterior Lower Leg: Boardered Foam Dressing Wound #2 Left Calcaneus: Boardered Foam  Dressing Dressing Change Frequency: Wound #1 Right,Posterior Lower Leg: Change dressing every other day. Wound #2 Left Calcaneus: Change dressing every other day. Follow-up Appointments: Wound #1 Right,Posterior Lower Leg: Return Appointment in 2 weeks. Wound #2 Left Calcaneus: Return Appointment in 2 weeks. Edema Control: Wound #1 Right,Posterior Lower Leg: Elevate legs to the level of the heart and pump ankles as often as possible Wound #2 Left Calcaneus: Elevate legs to the level of the heart and pump ankles as often as possible Off-Loading: Wound #1 Right,Posterior Lower Leg: Other: - PLEASE PROVIDE PATIENT WITH BILATERAL PREVALON BOOTS FOR PATIENT TO WEAR AT ALL TIMES WHILE IN BED AND IN WHEELCHAIR Wound #2 Left Calcaneus: Other: - PLEASE PROVIDE PATIENT WITH BILATERAL PREVALON BOOTS FOR PATIENT TO WEAR AT ALL TIMES WHILE IN BED AND IN [facility] Additional Orders / Instructions: Wound #1 Right,Posterior Lower Leg: Increase protein intake. Activity as tolerated Other: - Please add facility wound healing vitamins per facility protocol Wound #2 Left Calcaneus: Increase protein intake. Activity as tolerated Other: - Please add facility wound healing vitamins per facility protocol [name], KEES. ([id number]) I'm gonna suggest currently that we continue with the above wound care measures that will be a change switching to the collagen dressing for the next three weeks. Will subsequently see the patient back for reevaluation at that time to see were things stand how she has progressed. Please see above for specific wound care orders. We will see patient for re-evaluation in 3 week(s) here in the clinic. If anything worsens or changes patient will contact our office for additional recommendations. Electronic Signature(s) Signed: 08/19/2018 3:07:42 PM By: Lenda Kelp PA-C Entered By: Lenda Kelp on 08/19/2018 13:40:02 Armel, Aline Rexene Edison  ([id number]) -------------------------------------------------------------------------------- ROS/PFSH Details  Patient Name: [name], LABARRE. Date of Service: 08/19/2018 10:00 AM Medical Record Number: [id number] Patient Account Number: [id number] Date of Birth/Sex: 02-Feb-1926 (84 y.o. F) Treating RN: Renne Crigler Primary Care Provider: Einar Crow Other Clinician: Referring Provider: Einar Crow Treating Provider/Extender: Linwood Dibbles, Justice Aguirre Weeks in Treatment: 15 Information Obtained From Patient Wound History Do you currently have one or more open woundso Yes How many open wounds do you currently haveo 2 Approximately how long have you had your woundso 2 weeks How have you been treating your wound(s) until nowo 2 weeks Has your wound(s) ever healed and then re-openedo No Have you had any lab work done in the past montho No Have you tested positive for an antibiotic resistant organism (MRSA, VRE)o No Have you tested positive for osteomyelitis (bone infection)o No Have you had any tests for circulation on your legso No Constitutional Symptoms (General Health) Complaints and Symptoms: Negative for: Fever; Chills Eyes Medical History: Positive for: Cataracts - one eye Negative for: Glaucoma; Optic Neuritis Ear/Nose/Mouth/Throat Medical History: Negative for: Chronic sinus problems/congestion; Middle ear problems Hematologic/Lymphatic Medical History: Positive for: Lymphedema Negative for: Anemia; Hemophilia; Human Immunodeficiency Virus; Sickle Cell Disease Respiratory Complaints and Symptoms: No Complaints or Symptoms Medical History: Negative for: Aspiration; Asthma; Chronic Obstructive Pulmonary Disease (COPD); Pneumothorax; Sleep Apnea; Tuberculosis Cardiovascular Complaints and Symptoms: No Complaints or Symptoms Medical HistoryUNIQUE, Lindsay ([id number]) Positive for: Hypertension Negative for: Angina; Arrhythmia; Coronary Artery Disease;  Deep Vein Thrombosis; Hypotension; Myocardial Infarction; Peripheral Arterial Disease; Peripheral Venous Disease; Phlebitis; Vasculitis Gastrointestinal Medical History: Negative for: Cirrhosis ; Colitis; Crohnos; Hepatitis A; Hepatitis B; Hepatitis C Endocrine Medical History: Negative for: Type I Diabetes; Type II Diabetes Genitourinary Medical History: Negative for: End Stage Renal Disease Immunological Medical History: Negative for: Lupus Erythematosus; Raynaudos; Scleroderma Integumentary (Skin) Medical History: Negative for: History of Burn; History of pressure wounds Musculoskeletal Medical History: Positive for: Rheumatoid Arthritis Negative for: Gout; Osteoarthritis; Osteomyelitis Neurologic Medical History: Negative for: Dementia; Neuropathy; Quadriplegia; Paraplegia; Seizure Disorder Psychiatric Complaints and Symptoms: No Complaints or Symptoms Medical History: Negative for: Anorexia/bulimia; Confinement Anxiety HBO Extended History Items Eyes: Cataracts Immunizations Pneumococcal Vaccine: Received Pneumococcal Vaccination: Yes Implantable Devices Family and Social History NICHOEL, DIGIULIO ([id number]) Cancer: No; Diabetes: Yes - Father; Heart Disease: Yes - Father; Hypertension: No; Kidney Disease: Yes - Mother; Lung Disease: No; Seizures: No; Stroke: No; Thyroid Problems: No; Tuberculosis: No; Former smoker - 20 years stopped; Marital Status - Widowed; Alcohol Use: Rarely; Drug Use: No History; Caffeine Use: Daily; Financial Concerns: No; Food, Clothing or Shelter Needs: No; Support System Lacking: No; Transportation Concerns: No; Advanced Directives: Yes (Not Provided); Patient does not want information on Advanced Directives; Do not resuscitate: No; Living Will: Yes (Not Provided); Medical Power of Attorney: No Physician Affirmation I have reviewed and agree with the above information. Electronic Signature(s) Signed: 08/19/2018 3:07:42 PM By: Lenda Kelp PA-C Signed: 08/20/2018 4:22:48 PM By: Renne Crigler Entered By: Lenda Kelp on 08/19/2018 13:39:23 Righetti, Sheena Rexene Edison ([id number]) -------------------------------------------------------------------------------- SuperBill Details Patient Name: [name]. Date of Service: 08/19/2018 Medical Record Number: [id number] Patient Account Number: [id number] Date of Birth/Sex: 05/26/1926 (84 y.o. F) Treating RN: Renne Crigler Primary Care Provider: Einar Crow Other Clinician: Referring Provider: Einar Crow Treating Provider/Extender: Linwood Dibbles, Terrez Ander Weeks in Treatment: 15 Diagnosis Coding ICD-10 Codes Code Description [phone] Pressure ulcer of left heel, stage 3 L89.94 Pressure ulcer of unspecified site, stage 4 I87.2 Venous insufficiency (chronic) (peripheral) N18.3 Chronic kidney disease, stage 3 (  moderate) L08.9 Local infection of the skin and subcutaneous tissue, unspecified Facility Procedures CPT4 Code: [id number] Description: 99214 - WOUND CARE VISIT-LEV 4 EST PT Modifier: Quantity: 1 Physician Procedures CPT4 Code: [id number] Description: 99213 - WC PHYS LEVEL 3 - EST PT ICD-10 Diagnosis Description L89.623 Pressure ulcer of left heel, stage 3 L89.94 Pressure ulcer of unspecified site, stage 4 I87.2 Venous insufficiency (chronic) (peripheral) N18.3 Chronic kidney disease,  stage 3 (moderate) Modifier: Quantity: 1 Electronic Signature(s) Signed: 08/19/2018 3:07:42 PM By: Lenda Kelp PA-C Entered By: Lenda Kelp on 08/19/2018 13:40:21

## 2018-08-23 ENCOUNTER — Other Ambulatory Visit: Payer: Self-pay

## 2018-08-23 MED ORDER — OXYCODONE HCL 5 MG PO TABS: 5.0000 mg | ORAL_TABLET | ORAL | 0 refills | Status: DC | PRN

## 2018-08-23 MED ORDER — OXYCODONE HCL 5 MG PO TABS: 5.0000 mg | ORAL_TABLET | Freq: Three times a day (TID) | ORAL | 0 refills | Status: DC

## 2018-08-23 NOTE — Telephone Encounter (Signed)
Rx sent to [facility] phone : [phone] , fax : [phone]  

## 2018-08-24 ENCOUNTER — Encounter
Admission: RE | Admit: 2018-08-24 | Discharge: 2018-08-24 | Disposition: A | Discharge status: Home / Self Care | Payer: Medicare Other | Source: Ambulatory Visit | Attending: Internal Medicine | Admitting: Internal Medicine

## 2018-08-24 DIAGNOSIS — R609 Edema, unspecified: Secondary | ICD-10-CM | POA: Insufficient documentation

## 2018-08-28 DIAGNOSIS — T402X5A Adverse effect of other opioids, initial encounter: Secondary | ICD-10-CM

## 2018-08-28 DIAGNOSIS — N184 Chronic kidney disease, stage 4 (severe): Secondary | ICD-10-CM | POA: Insufficient documentation

## 2018-08-28 DIAGNOSIS — K5903 Drug induced constipation: Secondary | ICD-10-CM | POA: Insufficient documentation

## 2018-08-28 DIAGNOSIS — J302 Other seasonal allergic rhinitis: Secondary | ICD-10-CM | POA: Insufficient documentation

## 2018-08-28 DIAGNOSIS — F32A Depression, unspecified: Secondary | ICD-10-CM | POA: Insufficient documentation

## 2018-08-28 DIAGNOSIS — F329 Major depressive disorder, single episode, unspecified: Secondary | ICD-10-CM | POA: Insufficient documentation

## 2018-09-06 ENCOUNTER — Encounter: Payer: Self-pay | Admitting: Adult Health

## 2018-09-06 ENCOUNTER — Non-Acute Institutional Stay (SKILLED_NURSING_FACILITY): Payer: Medicare Other | Admitting: Adult Health

## 2018-09-06 DIAGNOSIS — G8929 Other chronic pain: Secondary | ICD-10-CM | POA: Diagnosis not present

## 2018-09-06 DIAGNOSIS — R300 Dysuria: Secondary | ICD-10-CM

## 2018-09-06 DIAGNOSIS — M545 Low back pain: Secondary | ICD-10-CM

## 2018-09-06 NOTE — Progress Notes (Signed)
Location:   [facility] Room Number: 301 B Place of Service:  SNF (31)   CODE STATUS: DNR  No Known Allergies  Chief Complaint  Patient presents with  . Acute Visit    Pain Management    HPI:  She has chronic right back pain. She is complaining worsening pain; she states that the pain is achy and cramping. She does complain of some dysuria. She denies any increased fatigue no shortness of breath or chest pain. There are no reports of fevers present.   Past Medical History:  Diagnosis Date  . Age-related osteoporosis with current pathol fx with routine healing 01/18/2018  . Anxiety   . Arthritis   . At risk for falls   . Balance problems   . Benign hypertensive CKD 01/18/2018  . Cataract   . Chronic kidney disease   . 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 Depression   . Disease of thyroid gland   . Frail elderly   . Gout   . Hypertension   . Impaired mobility   . Memory loss   . Neuromuscular disorder (HCC)   . Primary open angle glaucoma 09/11/2015  . Vision problems   . Visual impairment     Past Surgical History:  Procedure Laterality Date  . PARTIAL HYSTERECTOMY      Social History   Socioeconomic History  . Marital status: Married    Spouse name: Ivar Drape  . Number of children: 0  . Years of education:    . Highest education level: Not on file  Occupational History  . Not on file  Social Needs  . Financial resource strain: Not on file  . Food insecurity:    Worry: Not on file    Inability: Not on file  . Transportation needs:    Medical: Not on file    Non-medical: Not on file  Tobacco Use  . Smoking status: Former Games developer  . Smokeless tobacco: Never Used  Substance and Sexual Activity  . Alcohol use: No    Frequency: Never  . Drug use: No  . Sexual activity: Not on file  Lifestyle  . Physical activity:    Days per week: Not on file    Minutes per session: Not on file  . Stress: Not  on file  Relationships  . Social connections:    Talks on phone: Not on file    Gets together: Not on file    Attends religious service: Not on file    Active member of club or organization: Not on file    Attends meetings of clubs or organizations: Not on file    Relationship status: Not on file  . Intimate partner violence:    Fear of current or ex partner: Not on file    Emotionally abused: Not on file    Physically abused: Not on file    Forced sexual activity: Not on file  Other Topics Concern  . Not on file  Social History Narrative   Retired Garment/textile technologist   Married   No children   Former smoker   No smokeless tobacco   No alcohol use   Full Code    Family History  Problem Relation Age of Onset  . CAD Mother   . Kidney disease Father   . CAD Father       VITAL SIGNS BP (!) 160/65   Pulse 92   Temp 98.1 F (36.7 C)  Resp 16   Ht 5\' 4"  (1.626 m)   Wt 119 lb 1.6 oz (54 kg)   SpO2 99%   BMI 20.44 kg/m   Outpatient Encounter Medications as of 09/06/2018  Medication Sig  . acetaminophen (TYLENOL) 500 MG tablet Take 1,000 mg by mouth 3 (three) times daily.   09/08/2018 allopurinol (ZYLOPRIM) 100 MG tablet Take 100 mg by mouth daily.   . Amino Acids-Protein Hydrolys (FEEDING SUPPLEMENT, PRO-STAT SUGAR FREE 64,) LIQD Take 30 mLs by mouth 2 (two) times daily between meals.  Marland Kitchen aspirin EC 81 MG tablet Take 81 mg by mouth daily.  . Cholecalciferol 4000 units CAPS Take 4,000 Units by mouth daily.  . cyanocobalamin 1000 MCG tablet Take 1,000 mcg by mouth daily.  . fluticasone (FLONASE) 50 MCG/ACT nasal spray Place 2 sprays into both nostrils at bedtime.  . furosemide (LASIX) 20 MG tablet Take 20 mg by mouth daily.  . Infant Care Products [facility] EX) Apply liberal amount topically to area of skin irritation prn. OK to leave at bedside.  . latanoprost (XALATAN) 0.005 % ophthalmic solution Place 1 drop into both eyes at bedtime.  [facility] loratadine (CLARITIN) 10 MG tablet Take 10 mg by  mouth daily.  Marland Kitchen losartan (COZAAR) 25 MG tablet Take 25 mg by mouth daily.  . magnesium hydroxide (MILK OF MAGNESIA) 400 MG/5ML suspension Take 30 mLs by mouth every 4 (four) hours as needed. Constipation/ no BM for 2 days  . metoprolol tartrate (LOPRESSOR) 50 MG tablet Take 1 tablet (50 mg total) by mouth 2 (two) times daily.  . mirtazapine (REMERON) 15 MG tablet Take 15 mg by mouth at bedtime.   . NON FORMULARY Magic Cup 2 times daily with lunch and dinner  . Nutritional Supplements (ENSURE ENLIVE PO) Take 1 Bottle by mouth 2 (two) times daily between meals.  . Nutritional Supplements (NUTRITIONAL SUPPLEMENT PO) Diet order - downgrade patient to dysphagia 3 diet, add bowl of gravy to each tray: continue with thin liquids  . oxyCODONE (OXY IR/ROXICODONE) 5 MG immediate release tablet Take 1 tablet (5 mg total) by mouth every 4 (four) hours as needed.  Marland Kitchen oxyCODONE (ROXICODONE) 5 MG immediate release tablet Take 1 tablet (5 mg total) by mouth 3 (three) times daily.  . polyethylene glycol (MIRALAX / GLYCOLAX) packet Take 17 g by mouth daily as needed. Mix with 4-8 ounces fluid-- for constipation   . potassium chloride (MICRO-K) 10 MEQ CR capsule Take 10 mEq by mouth daily.  . sennosides-docusate sodium (SENOKOT-S) 8.6-50 MG tablet Take 2 tablets by mouth 2 (two) times daily.  . Skin Protectants, Misc. (NO-STING SKIN-PREP EX) Apply topically. Apply liberal amount to the tips of the toes and B-heels BID for skin protection  . zinc sulfate 220 (50 Zn) MG capsule Take 220 mg by mouth daily.  . [DISCONTINUED] collagenase (SANTYL) ointment cleanse left heel wound with ns, apply santyl nickel thick to slough, cover with ns gauze and Allevyn dressing and also Cleanse right leg wound with ns, skin prep periwound, apply santyl to wound bed nickel thick, cover with ns gauze and foam dressing.   No facility-administered encounter medications on file as of 09/06/2018.      SIGNIFICANT DIAGNOSTIC EXAMS  LABS  REVIEWED PREVIOUS:   06-16-18: wbc 4.9; hgb 11.1; hct 33.2; mcv 100.3; plt 226; glucose 86; bun 38; creat 1.66; k+ 3.5; na++ 144 ca 9.0 liver normal albumin 3.1;  07-20-18: glucose 91; bun 48; creat 1.71; k+ 4.1; na++ 144; ca  9.2 PTH 42  NO NEW LABS.      Review of Systems  Constitutional: Negative for malaise/fatigue.  Respiratory: Negative for cough and shortness of breath.   Cardiovascular: Negative for chest pain, palpitations and leg swelling.  Gastrointestinal: Negative for abdominal pain, constipation and heartburn.  Genitourinary: Positive for dysuria.  Musculoskeletal: Positive for back pain. Negative for joint pain and myalgias.       Right side   Skin: Negative.   Neurological: Negative for dizziness.  Psychiatric/Behavioral: The patient is not nervous/anxious.       Physical Exam  Constitutional: She is oriented to person, place, and time. No distress.  Frail   Neck: No thyromegaly present.  Cardiovascular: Normal rate, regular rhythm, normal heart sounds and intact distal pulses.  Pulmonary/Chest: Effort normal and breath sounds normal. No respiratory distress.  Abdominal: Soft. Bowel sounds are normal. She exhibits no distension. There is no tenderness.  Musculoskeletal: She exhibits no edema.  Is able to move all extremities   Lymphadenopathy:    She has no cervical adenopathy.  Neurological: She is alert and oriented to person, place, and time.  Skin: Skin is warm and dry. She is not diaphoretic.  Psychiatric: She has a normal mood and affect.     ASSESSMENT/ PLAN:  TODAY:   1.  Right side back pain: is worse; will continue tylenol 1 gm three times daily  oxycodone  5 mg three times daily and every 4 hours as needed.  Will begin lidoderm patch and robaxin 500 mg every 6 hours as needed  2. Dysuria: will beg ua/c&s  MD is aware of resident's narcotic use and is in agreement with current plan of care. We will attempt to wean resident as apropriate    Synthia Innocent NP [facility]  Contact [phone] Monday through Friday 8am- 5pm  After hours call [phone]

## 2018-09-07 ENCOUNTER — Other Ambulatory Visit
Admission: RE | Admit: 2018-09-07 | Discharge: 2018-09-07 | Disposition: A | Discharge status: Home / Self Care | Payer: Medicare Other | Source: Ambulatory Visit | Attending: Internal Medicine | Admitting: Internal Medicine

## 2018-09-07 DIAGNOSIS — N184 Chronic kidney disease, stage 4 (severe): Secondary | ICD-10-CM | POA: Insufficient documentation

## 2018-09-07 LAB — URINALYSIS, ROUTINE W REFLEX MICROSCOPIC
BILIRUBIN URINE: NEGATIVE
Glucose, UA: NEGATIVE mg/dL
Hgb urine dipstick: NEGATIVE
Ketones, ur: NEGATIVE mg/dL
LEUKOCYTES UA: NEGATIVE
NITRITE: NEGATIVE
PH: 5 (ref 5.0–8.0)
Protein, ur: NEGATIVE mg/dL
SPECIFIC GRAVITY, URINE: 1.014 (ref 1.005–1.030)

## 2018-09-08 ENCOUNTER — Other Ambulatory Visit: Payer: Self-pay

## 2018-09-08 ENCOUNTER — Observation Stay
Admission: EM | Admit: 2018-09-08 | Discharge: 2018-09-10 | Disposition: A | Discharge status: Skilled Nursing Facility | Payer: Medicare Other | Attending: Internal Medicine | Admitting: Internal Medicine

## 2018-09-08 ENCOUNTER — Other Ambulatory Visit
Admission: RE | Admit: 2018-09-08 | Discharge: 2018-09-08 | Disposition: A | Discharge status: Home / Self Care | Payer: Medicare Other | Source: Ambulatory Visit | Attending: Internal Medicine | Admitting: Internal Medicine

## 2018-09-08 ENCOUNTER — Encounter: Payer: Self-pay | Admitting: *Deleted

## 2018-09-08 ENCOUNTER — Emergency Department: Payer: Medicare Other

## 2018-09-08 DIAGNOSIS — M5136 Other intervertebral disc degeneration, lumbar region: Secondary | ICD-10-CM | POA: Diagnosis not present

## 2018-09-08 DIAGNOSIS — Z87891 Personal history of nicotine dependence: Secondary | ICD-10-CM | POA: Diagnosis not present

## 2018-09-08 DIAGNOSIS — M810 Age-related osteoporosis without current pathological fracture: Secondary | ICD-10-CM | POA: Insufficient documentation

## 2018-09-08 DIAGNOSIS — Z7982 Long term (current) use of aspirin: Secondary | ICD-10-CM | POA: Insufficient documentation

## 2018-09-08 DIAGNOSIS — N289 Disorder of kidney and ureter, unspecified: Secondary | ICD-10-CM

## 2018-09-08 DIAGNOSIS — N179 Acute kidney failure, unspecified: Secondary | ICD-10-CM | POA: Diagnosis not present

## 2018-09-08 DIAGNOSIS — F0390 Unspecified dementia without behavioral disturbance: Secondary | ICD-10-CM | POA: Insufficient documentation

## 2018-09-08 DIAGNOSIS — R410 Disorientation, unspecified: Secondary | ICD-10-CM

## 2018-09-08 DIAGNOSIS — K5903 Drug induced constipation: Secondary | ICD-10-CM | POA: Diagnosis not present

## 2018-09-08 DIAGNOSIS — E43 Unspecified severe protein-calorie malnutrition: Secondary | ICD-10-CM | POA: Diagnosis not present

## 2018-09-08 DIAGNOSIS — I872 Venous insufficiency (chronic) (peripheral): Secondary | ICD-10-CM | POA: Insufficient documentation

## 2018-09-08 DIAGNOSIS — N189 Chronic kidney disease, unspecified: Secondary | ICD-10-CM

## 2018-09-08 DIAGNOSIS — Z79899 Other long term (current) drug therapy: Secondary | ICD-10-CM | POA: Diagnosis not present

## 2018-09-08 DIAGNOSIS — M109 Gout, unspecified: Secondary | ICD-10-CM | POA: Insufficient documentation

## 2018-09-08 DIAGNOSIS — L8990 Pressure ulcer of unspecified site, unspecified stage: Secondary | ICD-10-CM

## 2018-09-08 DIAGNOSIS — I129 Hypertensive chronic kidney disease with stage 1 through stage 4 chronic kidney disease, or unspecified chronic kidney disease: Secondary | ICD-10-CM | POA: Insufficient documentation

## 2018-09-08 DIAGNOSIS — F419 Anxiety disorder, unspecified: Secondary | ICD-10-CM | POA: Insufficient documentation

## 2018-09-08 DIAGNOSIS — N184 Chronic kidney disease, stage 4 (severe): Secondary | ICD-10-CM | POA: Diagnosis not present

## 2018-09-08 DIAGNOSIS — R52 Pain, unspecified: Secondary | ICD-10-CM

## 2018-09-08 DIAGNOSIS — M6281 Muscle weakness (generalized): Secondary | ICD-10-CM | POA: Diagnosis not present

## 2018-09-08 DIAGNOSIS — D638 Anemia in other chronic diseases classified elsewhere: Secondary | ICD-10-CM | POA: Insufficient documentation

## 2018-09-08 DIAGNOSIS — D649 Anemia, unspecified: Secondary | ICD-10-CM | POA: Diagnosis present

## 2018-09-08 DIAGNOSIS — Z66 Do not resuscitate: Secondary | ICD-10-CM | POA: Diagnosis not present

## 2018-09-08 DIAGNOSIS — R609 Edema, unspecified: Secondary | ICD-10-CM | POA: Diagnosis not present

## 2018-09-08 DIAGNOSIS — F329 Major depressive disorder, single episode, unspecified: Secondary | ICD-10-CM | POA: Insufficient documentation

## 2018-09-08 LAB — TROPONIN I: Troponin I: <0.03 ng/mL (ref <0.03)

## 2018-09-08 LAB — BASIC METABOLIC PANEL
Anion gap: 8 (ref 5–15)
BUN: 76 mg/dL — AB (ref 8–23)
CHLORIDE: 114 mmol/L — AB (ref 98–111)
CO2: 20 mmol/L — AB (ref 22–32)
CREATININE: 2.31 mg/dL — AB (ref 0.44–1.00)
Calcium: 8.7 mg/dL — ABNORMAL LOW (ref 8.9–10.3)
GFR calc Af Amer: 20 mL/min — ABNORMAL LOW (ref >60)
GFR calc non Af Amer: 18 mL/min — ABNORMAL LOW (ref >60)
GLUCOSE: 100 mg/dL — AB (ref 70–99)
POTASSIUM: 3.8 mmol/L (ref 3.5–5.1)
Sodium: 142 mmol/L (ref 135–145)

## 2018-09-08 LAB — COMPREHENSIVE METABOLIC PANEL
ALK PHOS: 142 U/L — AB (ref 38–126)
ALT: 15 U/L (ref 0–44)
ANION GAP: 8 (ref 5–15)
AST: 22 U/L (ref 15–41)
Albumin: 3.8 g/dL (ref 3.5–5.0)
BILIRUBIN TOTAL: 0.9 mg/dL (ref 0.3–1.2)
BUN: 80 mg/dL — ABNORMAL HIGH (ref 8–23)
CO2: 20 mmol/L — AB (ref 22–32)
Calcium: 9.2 mg/dL (ref 8.9–10.3)
Chloride: 117 mmol/L — ABNORMAL HIGH (ref 98–111)
Creatinine, Ser: 2.38 mg/dL — ABNORMAL HIGH (ref 0.44–1.00)
GFR calc non Af Amer: 17 mL/min — ABNORMAL LOW (ref >60)
GFR, EST AFRICAN AMERICAN: 20 mL/min — AB (ref >60)
Glucose, Bld: 124 mg/dL — ABNORMAL HIGH (ref 70–99)
Potassium: 4.2 mmol/L (ref 3.5–5.1)
SODIUM: 145 mmol/L (ref 135–145)
TOTAL PROTEIN: 6.8 g/dL (ref 6.5–8.1)

## 2018-09-08 LAB — CBC
HCT: 21.7 % — ABNORMAL LOW (ref 36.0–46.0)
Hemoglobin: 7 g/dL — ABNORMAL LOW (ref 12.0–15.0)
MCH: 34.5 pg — ABNORMAL HIGH (ref 26.0–34.0)
MCHC: 32.3 g/dL (ref 30.0–36.0)
MCV: 106.9 fL — AB (ref 80.0–100.0)
NRBC: 0 % (ref 0.0–0.2)
Platelets: 280 10*3/uL (ref 150–400)
RBC: 2.03 MIL/uL — ABNORMAL LOW (ref 3.87–5.11)
RDW: 15.2 % (ref 11.5–15.5)
WBC: 5.6 10*3/uL (ref 4.0–10.5)

## 2018-09-08 LAB — URINE CULTURE

## 2018-09-08 LAB — URINALYSIS, COMPLETE (UACMP) WITH MICROSCOPIC
BILIRUBIN URINE: NEGATIVE
Bacteria, UA: NONE SEEN
Glucose, UA: NEGATIVE mg/dL
Hgb urine dipstick: NEGATIVE
Ketones, ur: NEGATIVE mg/dL
Leukocytes, UA: NEGATIVE
NITRITE: NEGATIVE
PH: 5 (ref 5.0–8.0)
Protein, ur: NEGATIVE mg/dL
Specific Gravity, Urine: 1.01 (ref 1.005–1.030)

## 2018-09-08 LAB — GLUCOSE, CAPILLARY: Glucose-Capillary: 112 mg/dL — ABNORMAL HIGH (ref 70–99)

## 2018-09-08 LAB — PREPARE RBC (CROSSMATCH)

## 2018-09-08 LAB — ABO/RH: ABO/RH(D): B POS

## 2018-09-08 MED ORDER — ASPIRIN EC 81 MG PO TBEC
81.0000 mg | DELAYED_RELEASE_TABLET | Freq: Every day | ORAL | Status: DC
  Administered 2018-09-09 – 2018-09-10 (×2): 81 mg via ORAL
  Filled 2018-09-08 (×2): qty 1

## 2018-09-08 MED ORDER — VITAMIN B-12 1000 MCG PO TABS
1000.0000 ug | ORAL_TABLET | Freq: Every day | ORAL | Status: DC
  Administered 2018-09-09 – 2018-09-10 (×2): 1000 ug via ORAL
  Filled 2018-09-08 (×2): qty 1

## 2018-09-08 MED ORDER — SODIUM CHLORIDE 0.9 % IV SOLN
10.0000 mL/h | Freq: Once | INTRAVENOUS | Status: AC
  Administered 2018-09-08: 19:00:00 10 mL/h via INTRAVENOUS

## 2018-09-08 MED ORDER — SENNOSIDES-DOCUSATE SODIUM 8.6-50 MG PO TABS
2.0000 | ORAL_TABLET | Freq: Two times a day (BID) | ORAL | Status: DC
  Administered 2018-09-08 – 2018-09-09 (×3): 2 via ORAL
  Filled 2018-09-08 (×3): qty 2

## 2018-09-08 MED ORDER — POLYETHYLENE GLYCOL 3350 17 G PO PACK: 17.0000 g | PACK | Freq: Every day | ORAL | Status: DC | PRN

## 2018-09-08 MED ORDER — ENSURE ENLIVE PO LIQD
237.0000 mL | Freq: Two times a day (BID) | ORAL | Status: DC
  Administered 2018-09-09: 13:00:00 237 mL

## 2018-09-08 MED ORDER — MIRTAZAPINE 15 MG PO TABS
15.0000 mg | ORAL_TABLET | Freq: Every day | ORAL | Status: DC
  Administered 2018-09-08 – 2018-09-09 (×2): 15 mg via ORAL
  Filled 2018-09-08 (×2): qty 1

## 2018-09-08 MED ORDER — ACETAMINOPHEN 325 MG PO TABS: 650.0000 mg | ORAL_TABLET | Freq: Four times a day (QID) | ORAL | Status: DC | PRN

## 2018-09-08 MED ORDER — ALLOPURINOL 100 MG PO TABS
100.0000 mg | ORAL_TABLET | Freq: Every day | ORAL | Status: DC
  Administered 2018-09-09 – 2018-09-10 (×2): 100 mg via ORAL
  Filled 2018-09-08 (×2): qty 1

## 2018-09-08 MED ORDER — SODIUM CHLORIDE 0.9 % IV BOLUS
500.0000 mL | Freq: Once | INTRAVENOUS | Status: AC
  Administered 2018-09-08: 500 mL via INTRAVENOUS

## 2018-09-08 MED ORDER — HEPARIN SODIUM (PORCINE) 5000 UNIT/ML IJ SOLN
5000.0000 [IU] | Freq: Three times a day (TID) | INTRAMUSCULAR | Status: DC
  Administered 2018-09-08 – 2018-09-10 (×5): 5000 [IU] via SUBCUTANEOUS
  Filled 2018-09-08 (×5): qty 1

## 2018-09-08 MED ORDER — ONDANSETRON HCL 4 MG/2ML IJ SOLN: 4.0000 mg | Freq: Four times a day (QID) | INTRAMUSCULAR | Status: DC | PRN

## 2018-09-08 MED ORDER — OXYCODONE HCL 5 MG PO TABS
5.0000 mg | ORAL_TABLET | Freq: Four times a day (QID) | ORAL | Status: DC | PRN
  Administered 2018-09-08: 22:00:00 5 mg via ORAL
  Filled 2018-09-08: qty 1

## 2018-09-08 MED ORDER — ACETAMINOPHEN 650 MG RE SUPP: 650.0000 mg | Freq: Four times a day (QID) | RECTAL | Status: DC | PRN

## 2018-09-08 MED ORDER — LOSARTAN POTASSIUM 25 MG PO TABS
25.0000 mg | ORAL_TABLET | Freq: Every day | ORAL | Status: DC
  Administered 2018-09-09 – 2018-09-10 (×2): 25 mg via ORAL
  Filled 2018-09-08 (×2): qty 1

## 2018-09-08 MED ORDER — VITAMIN D 1000 UNITS PO TABS
4000.0000 [IU] | ORAL_TABLET | Freq: Every day | ORAL | Status: DC
  Administered 2018-09-09 – 2018-09-10 (×2): 4000 [IU] via ORAL
  Filled 2018-09-08 (×2): qty 4

## 2018-09-08 MED ORDER — ONDANSETRON HCL 4 MG PO TABS: 4.0000 mg | ORAL_TABLET | Freq: Four times a day (QID) | ORAL | Status: DC | PRN

## 2018-09-08 MED ORDER — FLUTICASONE PROPIONATE 50 MCG/ACT NA SUSP
2.0000 | Freq: Every day | NASAL | Status: DC
  Administered 2018-09-08 – 2018-09-09 (×2): 2 via NASAL
  Filled 2018-09-08: qty 16

## 2018-09-08 MED ORDER — ZINC SULFATE 220 (50 ZN) MG PO CAPS
220.0000 mg | ORAL_CAPSULE | Freq: Every day | ORAL | Status: DC
  Administered 2018-09-09 – 2018-09-10 (×2): 220 mg via ORAL
  Filled 2018-09-08 (×2): qty 1

## 2018-09-08 MED ORDER — LORATADINE 10 MG PO TABS
10.0000 mg | ORAL_TABLET | Freq: Every day | ORAL | Status: DC
  Administered 2018-09-09 – 2018-09-10 (×2): 10 mg via ORAL
  Filled 2018-09-08 (×2): qty 1

## 2018-09-08 MED ORDER — LATANOPROST 0.005 % OP SOLN
1.0000 [drp] | Freq: Every day | OPHTHALMIC | Status: DC
  Administered 2018-09-08 – 2018-09-09 (×2): 1 [drp] via OPHTHALMIC
  Filled 2018-09-08: qty 2.5

## 2018-09-08 MED ORDER — METOPROLOL TARTRATE 50 MG PO TABS
50.0000 mg | ORAL_TABLET | Freq: Two times a day (BID) | ORAL | Status: DC
  Administered 2018-09-08 – 2018-09-10 (×4): 50 mg via ORAL
  Filled 2018-09-08 (×4): qty 1

## 2018-09-08 NOTE — ED Provider Notes (Signed)
[facility] Emergency Department Provider Note       Time seen: ----------------------------------------- 2:21 PM on 09/08/2018 -----------------------------------------   I have reviewed the triage vital signs and the nursing notes.  HISTORY   Chief Complaint Altered Mental Status    HPI [name] is a 84 y.o. female with a history of anxiety, chronic kidney disease, dementia, hypertension, neuromuscular disorder, who presents to the ED for altered mental status.  Patient arrives from [place] after reporting to the family that she felt "cloudy headed".  Patient requested patient be evaluated by the nurse practitioner but the facility was unable to provide anyone for the assessment.  Reportedly she had a UA but did not show a UTI.  She arrives alert and oriented except to the year.  She does not feel well but cannot describe it at this time.  Past Medical History:  Diagnosis Date  . Age-related osteoporosis with current pathol fx with routine healing 01/18/2018  . Anxiety   . Arthritis   . At risk for falls   . Balance problems   . Benign hypertensive CKD 01/18/2018  . Cataract   . Chronic kidney disease   . 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 Depression   . Disease of thyroid gland   . Frail elderly   . Gout   . Hypertension   . Impaired mobility   . Memory loss   . Neuromuscular disorder (HCC)   . Primary open angle glaucoma 09/11/2015  . Vision problems   . Visual impairment     Patient Active Problem List   Diagnosis Date Noted  . Chronic kidney disease (CKD), stage IV (severe) (HCC) 08/28/2018  . Constipation due to opioid therapy 08/28/2018  . Chronic seasonal allergic rhinitis 08/28/2018  . Chronic depression 08/28/2018  . Pressure ulcer caused by device 04/15/2018  . Protein-calorie malnutrition (HCC) 02/09/2018  . Hypomagnesemia 02/09/2018  . Vitamin B12 deficiency 02/09/2018  . Age-related  osteoporosis with current pathological fracture with routine healing 01/18/2018  . 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 Tibia/fibula fracture, right, closed, with routine healing, subsequent encounter 01/13/2018  . Cataracts, bilateral 09/11/2015  . Chronic gout of right foot 09/11/2015  . Essential hypertension 09/11/2015  . Primary open angle glaucoma 09/11/2015  . Primary osteoarthritis 09/11/2015  . Recurrent falls 09/11/2015  . Tinnitus of right ear 09/11/2015  . Venous insufficiency of both lower extremities 09/11/2015  . Weight loss 09/11/2015    Past Surgical History:  Procedure Laterality Date  . PARTIAL HYSTERECTOMY      Allergies Patient has no known allergies.  Social History Social History   Tobacco Use  . Smoking status: Former Games developer  . Smokeless tobacco: Never Used  Substance Use Topics  . Alcohol use: No    Frequency: Never  . Drug use: No   Review of Systems Constitutional: Negative for fever. Cardiovascular: Negative for chest pain. Respiratory: Negative for shortness of breath. Gastrointestinal: Negative for abdominal pain, vomiting and diarrhea. Musculoskeletal: Negative for back pain. Skin: Negative for rash. Neurological: Negative for headaches, focal weakness or numbness.  All systems negative/normal/unremarkable except as stated in the HPI  ____________________________________________   PHYSICAL EXAM:  VITAL SIGNS: ED Triage Vitals  Enc Vitals Group     BP 09/08/18 1354 (!) 126/47     Pulse Rate 09/08/18 1354 96     Resp 09/08/18 1354 16     Temp 09/08/18 1354 98.9  F (37.2 C)     Temp Source 09/08/18 1354 Oral     SpO2 09/08/18 1354 100 %     Weight 09/08/18 1355 119 lb 1.6 oz (54 kg)     Height 09/08/18 1355 5\' 4"  (1.626 m)     Head Circumference --      Peak Flow --      Pain Score 09/08/18 1355 9     Pain Loc --      Pain Edu? --      Excl. in GC? --    Constitutional: Alert and mostly  oriented.  No acute distress Eyes: Conjunctivae are normal. Normal extraocular movements. ENT   Head: Normocephalic and atraumatic.   Nose: No congestion/rhinnorhea.   Mouth/Throat: Mucous membranes are moist.   Neck: No stridor. Cardiovascular: Normal rate, regular rhythm. No murmurs, rubs, or gallops. Respiratory: Normal respiratory effort without tachypnea nor retractions. Breath sounds are clear and equal bilaterally. No wheezes/rales/rhonchi. Gastrointestinal: Soft and nontender. Normal bowel sounds Rectal: Heme-negative stool Musculoskeletal: Mild tenderness with mild edema, pressure ulcerations are noted with proper dressings Neurologic:  Normal speech and language. No gross focal neurologic deficits are appreciated.  Skin:  Skin is warm, dry and intact. No rash noted. Psychiatric: Mood and affect are normal. Speech and behavior are normal.  ____________________________________________  EKG: Interpreted by me.  Sinus rhythm the rate of 99 bpm, normal PR interval, normal QRS, normal QT, nonspecific T wave abnormalities.  ____________________________________________  ED COURSE:  As part of my medical decision making, I reviewed the following data within the electronic MEDICAL RECORD NUMBER History obtained from family if available, nursing notes, old chart and ekg, as well as notes from prior ED visits. Patient presented for altered mental status, we will assess with labs and imaging as indicated at this time.   Procedures ____________________________________________   LABS (pertinent positives/negatives)  Labs Reviewed  COMPREHENSIVE METABOLIC PANEL - Abnormal; Notable for the following components:      Result Value   Chloride 117 (*)    CO2 20 (*)    Glucose, Bld 124 (*)    BUN 80 (*)    Creatinine, Ser 2.38 (*)    Alkaline Phosphatase 142 (*)    GFR calc non Af Amer 17 (*)    GFR calc Af Amer 20 (*)    All other components within normal limits  CBC -  Abnormal; Notable for the following components:   RBC 2.03 (*)    Hemoglobin 7.0 (*)    HCT 21.7 (*)    MCV 106.9 (*)    MCH 34.5 (*)    All other components within normal limits  GLUCOSE, CAPILLARY - Abnormal; Notable for the following components:   Glucose-Capillary 112 (*)    All other components within normal limits  TROPONIN I  URINALYSIS, COMPLETE (UACMP) WITH MICROSCOPIC  CBG MONITORING, ED  PREPARE RBC (CROSSMATCH)  TYPE AND SCREEN   CRITICAL CARE Performed by: 09/10/18   Total critical care time: 30 minutes  Critical care time was exclusive of separately billable procedures and treating other patients.  Critical care was necessary to treat or prevent imminent or life-threatening deterioration.  Critical care was time spent personally by me on the following activities: development of treatment plan with patient and/or surrogate as well as nursing, discussions with consultants, evaluation of patient's response to treatment, examination of patient, obtaining history from patient or surrogate, ordering and performing treatments and interventions, ordering and review of laboratory studies,  ordering and review of radiographic studies, pulse oximetry and re-evaluation of patient's condition.  RADIOLOGY Images were viewed by me  CT head Is unremarkable ____________________________________________  DIFFERENTIAL DIAGNOSIS   Dehydration, electrolyte abnormality, dementia, CVA, occult infection  FINAL ASSESSMENT AND PLAN  Altered mental status, anemia, acute on chronic renal insufficiency   Plan: The patient had presented for altered mental status and confusion. Patient's labs did indicate a significant decrease in her hemoglobin compared to prior. Patient's imaging did not reveal any acute process.  He is likely symptomatic from anemia which may be from worsening renal insufficiency.  I will order 1 unit of blood and discussed with the hospitalist for  admission.   Ulice Dash, MD   Note: This note was generated in part or whole with voice recognition software. Voice recognition is usually quite accurate but there are transcription errors that can and very often do occur. I apologize for any typographical errors that were not detected and corrected.     Emily Filbert, MD 09/08/18 [phone]

## 2018-09-08 NOTE — ED Notes (Signed)
Patient transported to CT 

## 2018-09-08 NOTE — Progress Notes (Signed)
   09/08/18 1930  Clinical Encounter Type  Visited With Patient  Visit Type Initial (order for AD)  Referral From Nurse  Consult/Referral To Chaplain  Recommendations unit chaplain to follow up Thursday 10/17   Chaplain responded to order for AD.  When chaplain entered room, patient expressed that she was feeling 'very confused' and that her 'head wasn't on right.'  Patient asked chaplain if she (chaplain) knew where patient had been; chaplain admitted that she did not, but asked if patient had come from a nursing home based on patient description.  Patient thought that she had.  Patient stated that she had no good stories to tell and that she had a lot 'racing in her mind.'  Chaplain assured her that she didn't need to tell any stories or talk, that she could rest and gather her thoughts.  Chaplain asked if patient would like someone to follow up with her tomorrow; patient agreed.

## 2018-09-08 NOTE — H&P (Signed)
[facility] [place] at [facility]   PATIENT NAME: [name]    MR#:  [id number]  DATE OF BIRTH:  Oct 22, 1926  DATE OF ADMISSION:  09/08/2018  PRIMARY CARE PHYSICIAN: Lauro Regulus, MD   REQUESTING/REFERRING PHYSICIAN:   CHIEF COMPLAINT:   Chief Complaint  Patient presents with  . Altered Mental Status    HISTORY OF PRESENT ILLNESS: [name]  is a 84 y.o. female with a known history of arthritis, anxiety disorder, chronic kidney disease, dementia, ataxia, gout, hypertension, anemia presented to the emergency room for generalized weakness.  Patient is more awake and alert in the emergency room and responsive to verbal commands.  She is oriented to self and place.  She was evaluated found to be anemic with hemoglobin of 7.  Stool guaiac was negative in the emergency room.  Her creatinine has been elevated.  She has been on diuretics.  He also has some skin tears and pressure ulcers over lower extremities.  Hospitalist service was consulted for further care.  PAST MEDICAL HISTORY:   Past Medical History:  Diagnosis Date  . Age-related osteoporosis with current pathol fx with routine healing 01/18/2018  . Anxiety   . Arthritis   . At risk for falls   . Balance problems   . Benign hypertensive CKD 01/18/2018  . Cataract   . Chronic kidney disease   . 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 Depression   . Disease of thyroid gland   . Frail elderly   . Gout   . Hypertension   . Impaired mobility   . Memory loss   . Neuromuscular disorder (HCC)   . Primary open angle glaucoma 09/11/2015  . Vision problems   . Visual impairment     PAST SURGICAL HISTORY:  Past Surgical History:  Procedure Laterality Date  . PARTIAL HYSTERECTOMY      SOCIAL HISTORY:  Social History   Tobacco Use  . Smoking status: Former Games developer  . Smokeless tobacco: Never Used  Substance Use Topics  . Alcohol use: No     Frequency: Never    FAMILY HISTORY:  Family History  Problem Relation Age of Onset  . CAD Mother   . Kidney disease Father   . CAD Father     DRUG ALLERGIES: No Known Allergies  REVIEW OF SYSTEMS:   CONSTITUTIONAL: No fever,has  fatigue and weakness.  EYES: No blurred or double vision.  EARS, NOSE, AND THROAT: No tinnitus or ear pain.  RESPIRATORY: No cough, shortness of breath, wheezing or hemoptysis.  CARDIOVASCULAR: No chest pain, orthopnea, edema.  GASTROINTESTINAL: No nausea, vomiting, diarrhea or abdominal pain.  GENITOURINARY: No dysuria, hematuria.  ENDOCRINE: No polyuria, nocturia,  HEMATOLOGY: No anemia, easy bruising or bleeding SKIN: Has pressure ulcers over lower extremity and sacral area MUSCULOSKELETAL: No joint pain or arthritis.   NEUROLOGIC: No tingling, numbness, weakness.  PSYCHIATRY: No anxiety or depression.   MEDICATIONS AT HOME:  Prior to Admission medications   Medication Sig Start Date End Date Taking? Authorizing Provider  acetaminophen (TYLENOL) 500 MG tablet Take 1,000 mg by mouth 3 (three) times daily.     [provider]  allopurinol (ZYLOPRIM) 100 MG tablet Take 100 mg by mouth daily.  05/22/17   [provider]  Amino Acids-Protein Hydrolys (FEEDING SUPPLEMENT, PRO-STAT SUGAR FREE 64,) LIQD Take 30 mLs by mouth 2 (two) times daily between meals.    [provider]  aspirin EC  81 MG tablet Take 81 mg by mouth daily.    [provider]  Cholecalciferol 4000 units CAPS Take 4,000 Units by mouth daily.    [provider]  cyanocobalamin 1000 MCG tablet Take 1,000 mcg by mouth daily.    [provider]  fluticasone (FLONASE) 50 MCG/ACT nasal spray Place 2 sprays into both nostrils at bedtime. 08/13/18   [provider]  furosemide (LASIX) 20 MG tablet Take 20 mg by mouth daily. 09/01/18   [provider]  Infant Care Products [facility] EX) Apply liberal amount topically to area  of skin irritation prn. OK to leave at bedside.    [provider]  latanoprost (XALATAN) 0.005 % ophthalmic solution Place 1 drop into both eyes at bedtime.    [provider]  loratadine (CLARITIN) 10 MG tablet Take 10 mg by mouth daily. 08/13/18   [provider]  losartan (COZAAR) 25 MG tablet Take 25 mg by mouth daily.    [provider]  magnesium hydroxide (MILK OF MAGNESIA) 400 MG/5ML suspension Take 30 mLs by mouth every 4 (four) hours as needed. Constipation/ no BM for 2 days    [provider]  metoprolol tartrate (LOPRESSOR) 50 MG tablet Take 1 tablet (50 mg total) by mouth 2 (two) times daily. 01/15/18   Katha Hamming, MD  mirtazapine (REMERON) 15 MG tablet Take 15 mg by mouth at bedtime.  05/22/17   [provider]  NON FORMULARY Magic Cup 2 times daily with lunch and dinner    [provider]  Nutritional Supplements (ENSURE ENLIVE PO) Take 1 Bottle by mouth 2 (two) times daily between meals.    [provider]  Nutritional Supplements (NUTRITIONAL SUPPLEMENT PO) Diet order - downgrade patient to dysphagia 3 diet, add bowl of gravy to each tray: continue with thin liquids    [provider]  oxyCODONE (OXY IR/ROXICODONE) 5 MG immediate release tablet Take 1 tablet (5 mg total) by mouth every 4 (four) hours as needed. 08/23/18   Sharee Holster, NP  oxyCODONE (ROXICODONE) 5 MG immediate release tablet Take 1 tablet (5 mg total) by mouth 3 (three) times daily. 08/23/18   Sharee Holster, NP  polyethylene glycol (MIRALAX / GLYCOLAX) packet Take 17 g by mouth daily as needed. Mix with 4-8 ounces fluid-- for constipation     [provider]  potassium chloride (MICRO-K) 10 MEQ CR capsule Take 10 mEq by mouth daily. 08/31/18   [provider]  sennosides-docusate sodium (SENOKOT-S) 8.6-50 MG tablet Take 2 tablets by mouth 2 (two) times daily.    [provider]  Skin Protectants,  Misc. (NO-STING SKIN-PREP EX) Apply topically. Apply liberal amount to the tips of the toes and B-heels BID for skin protection    [provider]  zinc sulfate 220 (50 Zn) MG capsule Take 220 mg by mouth daily.    [provider]      PHYSICAL EXAMINATION:   VITAL SIGNS: Blood pressure (!) 139/53, pulse 98, temperature 98.9 F (37.2 C), temperature source Oral, resp. rate 20, height 5\' 4"  (1.626 m), weight 54 kg, SpO2 99 %.  GENERAL:  84 y.o.-year-old patient lying in the bed with no acute distress.  EYES: Pupils equal, round, reactive to light and accommodation. No scleral icterus. Extraocular muscles intact.  Pallor present HEENT: Head atraumatic, normocephalic. Oropharynx and nasopharynx clear.  NECK:  Supple, no jugular venous distention. No thyroid enlargement, no tenderness.  LUNGS: Normal breath sounds  bilaterally, no wheezing, rales,rhonchi or crepitation. No use of accessory muscles of respiration.  CARDIOVASCULAR: S1, S2 normal. No murmurs, rubs, or gallops.  ABDOMEN: Soft, nontender, nondistended. Bowel sounds present. No organomegaly or mass.  EXTREMITIES: Has pedal edema, no cyanosis, or clubbing.  NEUROLOGIC: Cranial nerves II through XII are intact. Muscle strength 5/5 in all extremities. Sensation intact. Gait not checked.  PSYCHIATRIC: The patient is alert and oriented x 3.  SKIN: Has pressure ulcers over the heel, right lower extremity and sacral area  LABORATORY PANEL:   CBC Recent Labs  Lab 09/08/18 1357  WBC 5.6  HGB 7.0*  HCT 21.7*  PLT 280  MCV 106.9*  MCH 34.5*  MCHC 32.3  RDW 15.2   ------------------------------------------------------------------------------------------------------------------  Chemistries  Recent Labs  Lab 09/08/18 0620 09/08/18 1357  NA 142 145  K 3.8 4.2  CL 114* 117*  CO2 20* 20*  GLUCOSE 100* 124*  BUN 76* 80*  CREATININE 2.31* 2.38*  CALCIUM 8.7* 9.2  AST  --  22  ALT  --  15  ALKPHOS  --  142*   BILITOT  --  0.9   ------------------------------------------------------------------------------------------------------------------ estimated creatinine clearance is 13.7 mL/min (A) (by C-G formula based on SCr of 2.38 mg/dL (H)). ------------------------------------------------------------------------------------------------------------------ No results for input(s): TSH, T4TOTAL, T3FREE, THYROIDAB in the last 72 hours.  Invalid input(s): FREET3   Coagulation profile No results for input(s): INR, PROTIME in the last 168 hours. ------------------------------------------------------------------------------------------------------------------- No results for input(s): DDIMER in the last 72 hours. -------------------------------------------------------------------------------------------------------------------  Cardiac Enzymes No results for input(s): CKMB, TROPONINI, MYOGLOBIN in the last 168 hours.  Invalid input(s): CK ------------------------------------------------------------------------------------------------------------------ Invalid input(s): POCBNP  ---------------------------------------------------------------------------------------------------------------  Urinalysis    Component Value Date/Time   COLORURINE YELLOW (A) 09/07/2018 0330   APPEARANCEUR CLEAR (A) 09/07/2018 0330   LABSPEC 1.014 09/07/2018 0330   PHURINE 5.0 09/07/2018 0330   GLUCOSEU NEGATIVE 09/07/2018 0330   HGBUR NEGATIVE 09/07/2018 0330   BILIRUBINUR NEGATIVE 09/07/2018 0330   KETONESUR NEGATIVE 09/07/2018 0330   PROTEINUR NEGATIVE 09/07/2018 0330   NITRITE NEGATIVE 09/07/2018 0330   LEUKOCYTESUR NEGATIVE 09/07/2018 0330     RADIOLOGY: Ct Head Wo Contrast  Result Date: 09/08/2018 CLINICAL DATA:  Altered level of consciousness. EXAM: CT HEAD WITHOUT CONTRAST TECHNIQUE: Contiguous axial images were obtained from the base of the skull through the vertex without intravenous contrast.  COMPARISON:  CT scan of January 13, 2018. FINDINGS: Brain: No evidence of acute infarction, hemorrhage, hydrocephalus, extra-axial collection or mass lesion/mass effect. Vascular: No hyperdense vessel or unexpected calcification. Skull: Normal. Negative for fracture or focal lesion. Sinuses/Orbits: No acute finding. Other: None. IMPRESSION: Normal head CT. Electronically Signed   By: Lupita Raider, M.D.   On: 09/08/2018 14:41    EKG: Orders placed or performed in visit on 09/08/18  . EKG 12-Lead    IMPRESSION AND PLAN:  84 year old female patient with history of gout, hypertension, chronic kidney disease, chronic anemia presented to the emergency room for lethargy and weakness  -Anemia Probably secondary to renal failure Transfuse 1 unit PRBC IV Monitor hemoglobin hematocrit Admit under observation bed.  -Acute on chronic kidney injury Hold diuretics Nephrology consultation Monitor creatinine  -Hypertension Continue oral metoprolol for control of blood pressure  -DVT prophylaxis subcu heparin  All the records are reviewed and case discussed with ED provider. Management plans discussed with the patient, family and they are in agreement.  CODE STATUS:DNR Code Status History    Date Active Date Inactive Code Status Order ID  Comments User Context   01/13/2018 1553 01/17/2018 1807 DNR [id number]  Alford Highland, MD ED    Questions for Most Recent Historical Code Status (Order [id number])    Question Answer Comment   In the event of cardiac or respiratory ARREST Do not call a "code blue"    In the event of cardiac or respiratory ARREST Do not perform Intubation, CPR, defibrillation or ACLS    In the event of cardiac or respiratory ARREST Use medication by any route, position, wound care, and other measures to relive pain and suffering. May use oxygen, suction and manual treatment of airway obstruction as needed for comfort.    Comments nurse may pronounce         Advance  Directive Documentation     Most Recent Value  Type of Advance Directive  Out of facility DNR (pink MOST or yellow form)  Pre-existing out of facility DNR order (yellow form or pink MOST form)  -  "MOST" Form in Place?  -       TOTAL TIME TAKING CARE OF THIS PATIENT: 52 minutes.    Ihor Austin M.D on 09/08/2018 at 3:28 PM  Between 7am to 6pm - Pager - [phone]  After 6pm go to [email or web address] - password [email or web address] [facility]  [facility]  Office  [phone]  CC: Primary care physician; Lauro Regulus, MD

## 2018-09-08 NOTE — ED Triage Notes (Signed)
Pt to ED from [facility] after reporting to family today that she felt, "cloudy headed" Family requested pt be evaluated by NP but facility was unable to provided assessment. Per EMS UA from facility did not show a UTI. Pt is alert to self, situation and place but unable to report the year. Unknown baseline.

## 2018-09-08 NOTE — Progress Notes (Signed)
Advanced care plan. Purpose of the Encounter: CODE STATUS Parties in Attendance: Patient and family Patient's Decision Capacity: Okay Subjective/Patient's story: Presented to emergency room for weakness Objective/Medical story Has anemia needs PRBC transfusion As worsening renal function needs nephrology evaluation Goals of care determination:  Advance care directives and goals of care discussed And family do not want any cardiac resuscitation, intubation ventilator if the need arises CODE STATUS: DNR Time spent discussing advanced care planning: 16 minutes

## 2018-09-08 NOTE — ED Notes (Signed)
This RN attempted to call report to [place], Charity fundraiser. Aundra Millet, RN informed this RN to call back in 10 minutes.

## 2018-09-09 ENCOUNTER — Ambulatory Visit: Payer: Medicare Other | Admitting: Physician Assistant

## 2018-09-09 ENCOUNTER — Observation Stay: Payer: Medicare Other

## 2018-09-09 DIAGNOSIS — E43 Unspecified severe protein-calorie malnutrition: Secondary | ICD-10-CM | POA: Diagnosis not present

## 2018-09-09 DIAGNOSIS — L8990 Pressure ulcer of unspecified site, unspecified stage: Secondary | ICD-10-CM

## 2018-09-09 LAB — BASIC METABOLIC PANEL
ANION GAP: 8 (ref 5–15)
BUN: 73 mg/dL — ABNORMAL HIGH (ref 8–23)
CHLORIDE: 118 mmol/L — AB (ref 98–111)
CO2: 20 mmol/L — AB (ref 22–32)
Calcium: 9.2 mg/dL (ref 8.9–10.3)
Creatinine, Ser: 2.1 mg/dL — ABNORMAL HIGH (ref 0.44–1.00)
GFR calc non Af Amer: 20 mL/min — ABNORMAL LOW (ref >60)
GFR, EST AFRICAN AMERICAN: 23 mL/min — AB (ref >60)
Glucose, Bld: 106 mg/dL — ABNORMAL HIGH (ref 70–99)
Potassium: 4.1 mmol/L (ref 3.5–5.1)
SODIUM: 146 mmol/L — AB (ref 135–145)

## 2018-09-09 LAB — CBC
HEMATOCRIT: 24.7 % — AB (ref 36.0–46.0)
Hemoglobin: 8.6 g/dL — ABNORMAL LOW (ref 12.0–15.0)
MCH: 35.1 pg — ABNORMAL HIGH (ref 26.0–34.0)
MCHC: 34.8 g/dL (ref 30.0–36.0)
MCV: 100.8 fL — AB (ref 80.0–100.0)
NRBC: 0 % (ref 0.0–0.2)
Platelets: 274 10*3/uL (ref 150–400)
RBC: 2.45 MIL/uL — AB (ref 3.87–5.11)
RDW: 20 % — AB (ref 11.5–15.5)
WBC: 5.3 10*3/uL (ref 4.0–10.5)

## 2018-09-09 LAB — FERRITIN: FERRITIN: 462 ng/mL — AB (ref 11–307)

## 2018-09-09 LAB — CK: Total CK: 24 U/L — ABNORMAL LOW (ref 38–234)

## 2018-09-09 MED ORDER — VITAMIN C 500 MG PO TABS
250.0000 mg | ORAL_TABLET | Freq: Two times a day (BID) | ORAL | Status: DC
  Administered 2018-09-09 – 2018-09-10 (×2): 250 mg via ORAL
  Filled 2018-09-09 (×2): qty 1

## 2018-09-09 MED ORDER — METHYLPREDNISOLONE SODIUM SUCC 40 MG IJ SOLR
40.0000 mg | Freq: Every day | INTRAMUSCULAR | Status: DC
  Administered 2018-09-09 – 2018-09-10 (×2): 40 mg via INTRAVENOUS
  Filled 2018-09-09 (×2): qty 1

## 2018-09-09 MED ORDER — COLLAGENASE 250 UNIT/GM EX OINT
TOPICAL_OINTMENT | Freq: Every day | CUTANEOUS | Status: DC
  Administered 2018-09-09 – 2018-09-10 (×2): via TOPICAL
  Filled 2018-09-09: qty 30

## 2018-09-09 MED ORDER — ADULT MULTIVITAMIN W/MINERALS CH
1.0000 | ORAL_TABLET | Freq: Every day | ORAL | Status: DC
  Administered 2018-09-10: 1 via ORAL
  Filled 2018-09-09: qty 1

## 2018-09-09 MED ORDER — MORPHINE SULFATE (PF) 2 MG/ML IV SOLN
2.0000 mg | INTRAVENOUS | Status: DC | PRN
  Administered 2018-09-09: 06:00:00 2 mg via INTRAVENOUS
  Filled 2018-09-09: qty 1

## 2018-09-09 MED ORDER — SODIUM CHLORIDE 0.9 % IV SOLN
INTRAVENOUS | Status: DC
  Administered 2018-09-09 – 2018-09-10 (×2): via INTRAVENOUS

## 2018-09-09 NOTE — Progress Notes (Signed)
Placed dressing per orders on left heel and right lower leg.

## 2018-09-09 NOTE — Progress Notes (Signed)
Pt alert and oriented x2, complaints of pain and discomfort of right lower leg.  Bed in low position, call bell within reach.  Bed alarms on and functioning.  Assessment done and charted.  Will continue to monitor and do hourly rounding throughout the shift

## 2018-09-09 NOTE — Consult Note (Signed)
Date: 09/09/2018                  Patient Name:  [name]  MRN: [id number]  DOB: [birth year]  Age / Sex: 84 y.o., female         PCP: Lauro Regulus, MD                 Service Requesting Consult: IM/ Alford Highland, MD                 Reason for Consult: ARF            History of Present Illness: Patient is a 84 y.o. female with medical problems of anxiety, chronic kidney disease, dementia, depression, who was admitted to [facility] from [facility] on 09/08/2018 for evaluation of altered mental status and confusion.  Patient was admitted for further evaluation and management Baseline creatinine appears to be 1.66, GFR 30. Presenting creatinine 2.38.  Today, with IV hydration, creatinine has improved slightly to 2.10 Patient still appears to be somewhat confused and is not able to provide detailed information She reports pain in her legs She also reports a fall at home She denies any fevers, chills, shortness of breath, chest pain, nausea, vomiting, diarrhea   Medications: Outpatient medications: Medications Prior to Admission  Medication Sig Dispense Refill Last Dose  . acetaminophen (TYLENOL) 500 MG tablet Take 1,000 mg by mouth 3 (three) times daily.    09/08/2018 at 0730  . allopurinol (ZYLOPRIM) 100 MG tablet Take 100 mg by mouth daily.    09/08/2018 at 0730  . Amino Acids-Protein Hydrolys (FEEDING SUPPLEMENT, PRO-STAT SUGAR FREE 64,) LIQD Take 30 mLs by mouth 2 (two) times daily between meals.   09/08/2018 at 0730  . aspirin EC 81 MG tablet Take 81 mg by mouth daily.   09/08/2018 at 0730  . Cholecalciferol 4000 units CAPS Take 4,000 Units by mouth daily.   09/08/2018 at 0730  . cyanocobalamin 1000 MCG tablet Take 1,000 mcg by mouth daily.   09/08/2018 at 0730  . fluticasone (FLONASE) 50 MCG/ACT nasal spray Place 2 sprays into both nostrils at bedtime.   09/07/2018 at 2030  . furosemide (LASIX) 20 MG tablet Take 20 mg by mouth daily.   09/08/2018 at 0730   . Infant Care Products [facility] EX) Apply liberal amount topically to area of skin irritation prn. OK to leave at bedside.   PRN at PRN  . latanoprost (XALATAN) 0.005 % ophthalmic solution Place 1 drop into both eyes at bedtime.   09/07/2018 at 2030  . Lidocaine (ASPERCREME LIDOCAINE) 4 % PTCH Apply 1 patch topically daily.   09/08/2018 at 0730  . loratadine (CLARITIN) 10 MG tablet Take 10 mg by mouth daily.   09/08/2018 at 0730  . losartan (COZAAR) 25 MG tablet Take 25 mg by mouth daily.   09/08/2018 at 0730  . magnesium hydroxide (MILK OF MAGNESIA) 400 MG/5ML suspension Take 30 mLs by mouth every 4 (four) hours as needed. Constipation/ no BM for 2 days   PRN at PRN  . methocarbamol (ROBAXIN) 500 MG tablet Take 500 mg by mouth every 6 (six) hours as needed for muscle spasms.   09/07/2018 at 0230  . metoprolol tartrate (LOPRESSOR) 50 MG tablet Take 1 tablet (50 mg total) by mouth 2 (two) times daily. 30 tablet 0 09/07/2018 at 2011  . mirtazapine (REMERON) 15 MG tablet Take 15 mg by mouth at bedtime.    09/07/2018 at 2030  .  oxyCODONE (OXY IR/ROXICODONE) 5 MG immediate release tablet Take 1 tablet (5 mg total) by mouth every 4 (four) hours as needed. 100 tablet 0 09/07/2018 at 0350  . oxyCODONE (ROXICODONE) 5 MG immediate release tablet Take 1 tablet (5 mg total) by mouth 3 (three) times daily. 100 tablet 0 09/08/2018 at 0621  . polyethylene glycol (MIRALAX / GLYCOLAX) packet Take 17 g by mouth daily as needed. Mix with 4-8 ounces fluid-- for constipation    PRN at PRN  . potassium chloride (MICRO-K) 10 MEQ CR capsule Take 10 mEq by mouth daily.   09/08/2018 at 0730  . sennosides-docusate sodium (SENOKOT-S) 8.6-50 MG tablet Take 2 tablets by mouth 2 (two) times daily.   09/08/2018 at 0730  . Skin Protectants, Misc. (NO-STING SKIN-PREP EX) Apply topically. Apply liberal amount to the tips of the toes and B-heels BID for skin protection   09/08/2018 at 0730  . zinc sulfate 220 (50 Zn) MG capsule  Take 220 mg by mouth daily.   09/08/2018 at 0730    Current medications: Current Facility-Administered Medications  Medication Dose Route Frequency Provider Last Rate Last Dose  . acetaminophen (TYLENOL) tablet 650 mg  650 mg Oral Q6H PRN Ihor Austin, MD       Or  . acetaminophen (TYLENOL) suppository 650 mg  650 mg Rectal Q6H PRN Pyreddy, Vivien Rota, MD      . allopurinol (ZYLOPRIM) tablet 100 mg  100 mg Oral Daily Pyreddy, Vivien Rota, MD   100 mg at 09/09/18 0954  . aspirin EC tablet 81 mg  81 mg Oral Daily Ihor Austin, MD   81 mg at 09/09/18 0953  . cholecalciferol (VITAMIN D) tablet 4,000 Units  4,000 Units Oral Daily Ihor Austin, MD   4,000 Units at 09/09/18 0953  . collagenase (SANTYL) ointment   Topical Daily Wieting, Richard, MD      . feeding supplement (ENSURE ENLIVE) (ENSURE ENLIVE) liquid 237 mL  237 mL Per Tube BID BM Pyreddy, Pavan, MD   237 mL at 09/09/18 1317  . fluticasone (FLONASE) 50 MCG/ACT nasal spray 2 spray  2 spray Each Nare QHS Ihor Austin, MD   2 spray at 09/08/18 2132  . heparin injection 5,000 Units  5,000 Units Subcutaneous Q8H Ihor Austin, MD   5,000 Units at 09/09/18 1315  . latanoprost (XALATAN) 0.005 % ophthalmic solution 1 drop  1 drop Both Eyes QHS Pyreddy, Vivien Rota, MD   1 drop at 09/08/18 2132  . loratadine (CLARITIN) tablet 10 mg  10 mg Oral Daily Pyreddy, Vivien Rota, MD   10 mg at 09/09/18 0953  . losartan (COZAAR) tablet 25 mg  25 mg Oral Daily Pyreddy, Vivien Rota, MD   25 mg at 09/09/18 0955  . methylPREDNISolone sodium succinate (SOLU-MEDROL) 40 mg/mL injection 40 mg  40 mg Intravenous Daily Alford Highland, MD   40 mg at 09/09/18 1318  . metoprolol tartrate (LOPRESSOR) tablet 50 mg  50 mg Oral BID Ihor Austin, MD   50 mg at 09/09/18 0954  . mirtazapine (REMERON) tablet 15 mg  15 mg Oral QHS Ihor Austin, MD   15 mg at 09/08/18 2131  . morphine 2 MG/ML injection 2 mg  2 mg Intravenous Q4H PRN Arnaldo Natal, MD   2 mg at 09/09/18 0543  . ondansetron  (ZOFRAN) tablet 4 mg  4 mg Oral Q6H PRN Pyreddy, Vivien Rota, MD       Or  . ondansetron (ZOFRAN) injection 4 mg  4 mg Intravenous Q6H PRN Pyreddy,  Pavan, MD      . oxyCODONE (Oxy IR/ROXICODONE) immediate release tablet 5 mg  5 mg Oral Q6H PRN Ihor Austin, MD   5 mg at 09/08/18 2130  . polyethylene glycol (MIRALAX / GLYCOLAX) packet 17 g  17 g Oral Daily PRN Pyreddy, Vivien Rota, MD      . senna-docusate (Senokot-S) tablet 2 tablet  2 tablet Oral BID Ihor Austin, MD   2 tablet at 09/09/18 0954  . vitamin B-12 (CYANOCOBALAMIN) tablet 1,000 mcg  1,000 mcg Oral Daily Ihor Austin, MD   1,000 mcg at 09/09/18 0954  . zinc sulfate capsule 220 mg  220 mg Oral Daily Pyreddy, Vivien Rota, MD   220 mg at 09/09/18 4098      Allergies: No Known Allergies    Past Medical History: Past Medical History:  Diagnosis Date  . Age-related osteoporosis with current pathol fx with routine healing 01/18/2018  . Anxiety   . Arthritis   . At risk for falls   . Balance problems   . Benign hypertensive CKD 01/18/2018  . Cataract   . Chronic kidney disease   . 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 Depression   . Disease of thyroid gland   . Frail elderly   . Gout   . Hypertension   . Impaired mobility   . Memory loss   . Neuromuscular disorder (HCC)   . Primary open angle glaucoma 09/11/2015  . Vision problems   . Visual impairment      Past Surgical History: Past Surgical History:  Procedure Laterality Date  . PARTIAL HYSTERECTOMY       Family History: Family History  Problem Relation Age of Onset  . CAD Mother   . Kidney disease Father   . CAD Father      Social History: Social History   Socioeconomic History  . Marital status: Widowed    Spouse name: Ivar Drape  . Number of children: 0  . Years of education:    . Highest education level: Not on file  Occupational History  . Occupation: retired  Engineer, production  . Financial resource strain: Not very hard  . Food  insecurity:    Worry: Never true    Inability: Never true  . Transportation needs:    Medical: Not on file    Non-medical: Not on file  Tobacco Use  . Smoking status: Former Games developer  . Smokeless tobacco: Never Used  Substance and Sexual Activity  . Alcohol use: No    Frequency: Never  . Drug use: No  . Sexual activity: Not on file  Lifestyle  . Physical activity:    Days per week: Not on file    Minutes per session: Not on file  . Stress: Not on file  Relationships  . Social connections:    Talks on phone: Not on file    Gets together: Not on file    Attends religious service: Not on file    Active member of club or organization: Not on file    Attends meetings of clubs or organizations: Not on file    Relationship status: Not on file  . Intimate partner violence:    Fear of current or ex partner: Not on file    Emotionally abused: Not on file    Physically abused: Not on file    Forced sexual activity: Not on file  Other Topics Concern  . Not on file  Social History Narrative   Retired  RN nurse   Married   No children   Former smoker   No smokeless tobacco   No alcohol use   Full Code      Review of Systems: Not reliable Gen:  HEENT:  CV:  Resp:  GI: GU :  MS:  Derm:   Psych: Heme:  Neuro:  Endocrine  Vital Signs: Blood pressure (!) 138/54, pulse 91, temperature 98.4 F (36.9 C), temperature source Oral, resp. rate 17, height 5\' 4"  (1.626 m), weight 54 kg, SpO2 100 %.   Intake/Output Summary (Last 24 hours) at 09/09/2018 1324 Last data filed at 09/09/2018 0900 Gross per 24 hour  Intake 0 ml  Output -  Net 0 ml    Weight trends: 09/11/2018   09/08/18 1355  Weight: 54 kg    Physical Exam: General:  Elderly, frail  HEENT  mouth appears dry, anicteric  Neck:  Supple  Lungs:  Normal breathing effort on room air, clear to auscultation  Heart::  Regular, no rub  Abdomen:  Soft, nontender  Extremities:  1+ edema, tender to touch  bilaterally  Neurologic:  Alert, able to follow simple commands  Skin:  Normal turgor    Lab results: Basic Metabolic Panel: Recent Labs  Lab 09/08/18 0620 09/08/18 1357 09/09/18 0350  NA 142 145 146*  K 3.8 4.2 4.1  CL 114* 117* 118*  CO2 20* 20* 20*  GLUCOSE 100* 124* 106*  BUN 76* 80* 73*  CREATININE 2.31* 2.38* 2.10*  CALCIUM 8.7* 9.2 9.2    Liver Function Tests: Recent Labs  Lab 09/08/18 1357  AST 22  ALT 15  ALKPHOS 142*  BILITOT 0.9  PROT 6.8  ALBUMIN 3.8   No results for input(s): LIPASE, AMYLASE in the last 168 hours. No results for input(s): AMMONIA in the last 168 hours.  CBC: Recent Labs  Lab 09/08/18 1357 09/09/18 0350  WBC 5.6 5.3  HGB 7.0* 8.6*  HCT 21.7* 24.7*  MCV 106.9* 100.8*  PLT 280 274    Cardiac Enzymes: Recent Labs  Lab 09/08/18 1357 09/09/18 0350  CKTOTAL  --  24*  TROPONINI <0.03  --     BNP: Invalid input(s): POCBNP  CBG: Recent Labs  Lab 09/08/18 1412  GLUCAP 112*    Microbiology: Recent Results (from the past 720 hour(s))  Urine Culture     Status: Abnormal   Collection Time: 09/07/18  3:30 AM  Result Value Ref Range Status   Specimen Description   Final    URINE, RANDOM Performed at [facility], [address], [place], [place]    Special Requests   Final    NONE Performed at [facility], [address] [place], [place]    Culture MULTIPLE SPECIES PRESENT, SUGGEST RECOLLECTION (A)  Final   Report Status 09/08/2018 FINAL  Final     Coagulation Studies: No results for input(s): LABPROT, INR in the last 72 hours.  Urinalysis: Recent Labs    09/07/18 0330 09/08/18 1412  COLORURINE YELLOW* YELLOW*  LABSPEC 1.014 1.010  PHURINE 5.0 5.0  GLUCOSEU NEGATIVE NEGATIVE  HGBUR NEGATIVE NEGATIVE  BILIRUBINUR NEGATIVE NEGATIVE  KETONESUR NEGATIVE NEGATIVE  PROTEINUR NEGATIVE NEGATIVE  NITRITE NEGATIVE NEGATIVE  LEUKOCYTESUR NEGATIVE NEGATIVE         Imaging: Ct Head Wo Contrast  Result Date: 09/08/2018 CLINICAL DATA:  Altered level of consciousness. EXAM: CT HEAD WITHOUT CONTRAST TECHNIQUE: Contiguous axial images were obtained from the base of the skull through  the vertex without intravenous contrast. COMPARISON:  CT scan of January 13, 2018. FINDINGS: Brain: No evidence of acute infarction, hemorrhage, hydrocephalus, extra-axial collection or mass lesion/mass effect. Vascular: No hyperdense vessel or unexpected calcification. Skull: Normal. Negative for fracture or focal lesion. Sinuses/Orbits: No acute finding. Other: None. IMPRESSION: Normal head CT. Electronically Signed   By: Lupita Raider, M.D.   On: 09/08/2018 14:41   Dg Hip Unilat With Pelvis 2-3 Views Right  Result Date: 09/09/2018 CLINICAL DATA:  RIGHT hip pain EXAM: DG HIP (WITH OR WITHOUT PELVIS) 2-3V RIGHT COMPARISON:  06/09/2017 FINDINGS: Diffuse osseous demineralization. Narrowing of the hip joints bilaterally. SI joints preserved. No acute fracture, dislocation, or bone destruction. Degenerative disc and facet disease changes at visualized lower lumbar spine. Scattered atherosclerotic calcifications. IMPRESSION: Osseous demineralization with mild degenerative changes of the hip joints and degenerative disc/facet disease changes of the lower lumbar spine. No acute abnormalities. Electronically Signed   By: Ulyses Southward M.D.   On: 09/09/2018 11:59      Assessment & Plan: Pt is a 84 y.o. African-American  female with anxiety, chronic kidney disease, dementia, hypertension, was admitted on 09/08/2018 with confusion and altered mental status.   1.  Acute renal failure Acute kidney injury is likely secondary to hypovolemia and hemodynamic changes.  Clinically, patient appears dry. -We will order normal saline 100 cc/h x 10 hours  2.  Chronic kidney disease stage III.  Baseline creatinine 1.66, GFR 30 from June 16, 2018       LOS: 0 Bannon Giammarco 10/17/20191:24  PM  [facility] [place], [place] [phone]  Note: This note was prepared with Dragon dictation. Any transcription errors are unintentional

## 2018-09-09 NOTE — Clinical Social Work Note (Signed)
Clinical Social Work Assessment  Patient Details  Name: [name] MRN: [id number] Date of Birth: Feb 13, 1926  Date of referral:  09/09/18               Reason for consult:  Facility Placement                Permission sought to share information with:  Case Manager, Magazine features editor, Family Supports Permission granted to share information::  Yes, Verbal Permission Granted  Name::        Agency::     Relationship::     Contact Information:     Housing/Transportation Living arrangements for the past 2 months:  Skilled Building surveyor of Information:  Facility Patient Interpreter Needed:  None Criminal Activity/Legal Involvement Pertinent to Current Situation/Hospitalization:  No - Comment as needed Significant Relationships:  Siblings Lives with:  Facility Resident Do you feel safe going back to the place where you live?  Yes Need for family participation in patient care:  Yes (Comment)  Care giving concerns:  Patient is a long term resident at [facility] assessment / plan:  CSW consulted for facility placement. CSW attempted to meet with patient but she was asleep and no family in the room. CSW found through chart review that patient is from [facility]. CSW spoke with Ladona Ridgel, admissions coordinator at [facility]. Ladona Ridgel states that patient is a long term resident at [facility] place and should return when ready. CSW will follow for discharge planning.   Employment status:  Retired Database administrator PT Recommendations:  Skilled Nursing Facility Information / Referral to community resources:     Patient/Family's Response to care:  Patient sleeping and family unavailable   Patient/Family's Understanding of and Emotional Response to Diagnosis, Current Treatment, and Prognosis:  NA   Emotional Assessment Appearance:  Appears stated age Attitude/Demeanor/Rapport:  Unable to Assess Affect (typically  observed):  Unable to Assess Orientation:  Oriented to Self, Oriented to Place Alcohol / Substance use:  Not Applicable Psych involvement (Current and /or in the community):  No (Comment)  Discharge Needs  Concerns to be addressed:  Discharge Planning Concerns Readmission within the last 30 days:  No Current discharge risk:  None Barriers to Discharge:  Continued Medical Work up   [employer], LCSWA 09/09/2018, 3:07 PM

## 2018-09-09 NOTE — Consult Note (Signed)
WOC Nurse wound consult note.  Patient seen at [facility]. No family present.  Reason for Consult: Multiple wounds Wound type: Left heel is consistent with PI, origin of RLE unclear. Pressure Injury POA: Yes Measurements: Left posterior heel: wound measures 0.3cm x 0.2cm x 0.2cm. Area of wound bed observable is pink. Periwound is heavily callused and has a rolled wound edge. As long as edges are this way, do not expect this wound to heal. There is no erythema, induration or odor. Plan of care: Cleanse left heel with soap and water, pat dry and apply small foam dressing. Change every 3 days. Next change date is 10/20 Right lateral lower leg: measures 3.8cm x 1cm, depth not measurable. Wound be obscured by yellow linear tissue. This yellow tissue could represent slough but my also be exposed tendon/ligament. Plan of care for this area: Apply Santyl in a nickel thick layer. Cover with a saline moistened gauze, then dry gauze or ABD pad. Change daily. If no improvement in 5 days consider changing therapy. At that point and time I would tend to believe that the yellow tissue is tendon or ligament and wound would be in need of different therapy. No induration, no odor, no erythema today.  Original consult order suggested wound to sacrum however no wound found to sacral area.  Monitor the wound area(s) for worsening of condition such as: Signs/symptoms of infection,  Increase in size,  Development of or worsening of odor, Development of pain, or increased pain at the affected locations.  Notify the medical team if any of these develop.  Thank you for the consult.  Discussed plan of care with the patient and bedside nurse.  WOC nurse will not follow at this time.  Please re-consult the WOC team if needed.  Helmut Muster, RN, MSN, CWOCN, CNS-BC, pager [phone]

## 2018-09-09 NOTE — Progress Notes (Signed)
Initial Nutrition Assessment  DOCUMENTATION CODES:   Severe malnutrition in context of chronic illness  INTERVENTION:  Continue Ensure Enlive po BID, each supplement provides 350 kcal and 20 grams of protein.  Provide Magic cup TID with meals, each supplement provides 290 kcal and 9 grams of protein. Patient prefers vanilla.  Continue zinc sulfate 220 mg daily.  Also provide MVI daily and vitamin C 250 mg BID to help promote wound healing.  NUTRITION DIAGNOSIS:   Severe Malnutrition related to chronic illness(CKD, dementia, inadequate oral intake) as evidenced by severe fat depletion, severe muscle depletion.  GOAL:   Patient will meet greater than or equal to 90% of their needs  MONITOR:   PO intake, Supplement acceptance, Labs, Weight trends, Skin, I & O's  REASON FOR ASSESSMENT:   Malnutrition Screening Tool    ASSESSMENT:   84 year old female with PMHx of dementia, HTN, OP, CKD, anxiety, depression, arthritis, gout who is admitted with AKI on CKD stage III, anemia of chronic disease, right hip pain likely a bursitis, right lower calf wound and left heel decubiti.   -Noted diet was downgraded this morning to dysphagia 1 with thin liquids by MD as that is her home diet at her facility.  Met with patient at bedside. She is from [facility] at [facility]. Patient reports she has had a poor appetite for a while now. She is served 3 meals per day at her facility. She tries to eat the vegetables on her tray and most of the meat off the tray. She also is given Ensure Enlive BID between meals. She reports that Ensure gives her diarrhea. RD offered to switch patient to a different supplement but she reports she wants to continue drinking it because she likes it. Of note, she is ordered for senna-docusate 2 tablets BID here and also noted a stool softener BID on her medication list from her facility. She also enjoys vanilla ice cream.   Patient reports her UBW for most of her adult  life was 115 lbs. Lately her weight has been fluctuating from 111-115 lbs. She is currently 54 kg (119.1 lbs).  Medications reviewed and include: allopurinol, vitamin D 4000 units daily, Solu-Medrol 40 mg daily IV, Remeron 15 mg QHS, senna-docusate 2 tablets BID, vitamin B12 1000 micrograms daily, zinc sulfate 220 mg daily, NS @ 100 mL/hr.  Labs reviewed: Sodium 146, Chloride 118, CO2 20, BUN 73, Creatinine 2.1.  NUTRITION - FOCUSED PHYSICAL EXAM:    Most Recent Value  Orbital Region  Severe depletion  Upper Arm Region  Severe depletion  Thoracic and Lumbar Region  Severe depletion  Buccal Region  Severe depletion  Temple Region  Severe depletion  Clavicle Bone Region  Severe depletion  Clavicle and Acromion Bone Region  Severe depletion  Scapular Bone Region  Unable to assess  Dorsal Hand  Severe depletion  Patellar Region  Unable to assess  Anterior Thigh Region  Unable to assess  Posterior Calf Region  Unable to assess  Edema (RD Assessment)  Moderate [bilateral lower extremities]  Hair  Reviewed  Eyes  Reviewed  Mouth  Reviewed [missing dentures]  Skin  Reviewed  Nails  Reviewed     Diet Order:   Diet Order            DIET - DYS 1 Room service appropriate? Yes; Fluid consistency: Thin  Diet effective now              EDUCATION NEEDS:   No  education needs have been identified at this time  Skin:  Skin Assessment: Skin Integrity Issues:(open wound posterior proximal right tibial; stage I medial coccyx; stage II left heel)   Last BM:  09/09/2018 - large type 7  Height:   Ht Readings from Last 1 Encounters:  09/08/18 _0  (1.626 m)    Weight:   Wt Readings from Last 1 Encounters:  09/08/18 54 kg    Ideal Body Weight:  54.5 kg  BMI:  Body mass index is 20.44 kg/m.  Estimated Nutritional Needs:   Kcal:  1350-1620 (25-30 kcal/kg)  Protein:  70-80 grams (1.3-1.5 grams/kg)  Fluid:  1.3-1.6 L/day (1 mL/kcal)  Willey Blade, MS, RD, LDN Office:  [phone] Pager: [phone] After Hours/Weekend Pager: [phone]

## 2018-09-09 NOTE — Evaluation (Signed)
Physical Therapy Evaluation Patient Details Name: [name] MRN: [id number] DOB: 1926/11/10 Today's Date: 09/09/2018   History of Present Illness  84 y.o. female with a known history of arthritis, anxiety disorder, chronic kidney disease, dementia, ataxia, gout, hypertension, anemia presented to the emergency room for generalized weakness. Pt has been complaining of R LE pain, has L heel pressure ulcer.  Clinical Impression  Pt eager to work with PT and was able to do some limited in-room mobility/ambulation but showed limited confidence with standing and stepping.  She did need some assist to get to sitting EOB and to standing, but despite R hip pain and some fatigue.  She has been more limited recently with mobility but is not at her recent baseline in this regard, recommending STR when stable for d/c.      Follow Up Recommendations SNF    Equipment Recommendations       Recommendations for Other Services       Precautions / Restrictions Precautions Precautions: Fall Restrictions Weight Bearing Restrictions: No      Mobility  Bed Mobility Overal bed mobility: Needs Assistance Bed Mobility: Supine to Sit;Sit to Supine     Supine to sit: Min assist Sit to supine: Min assist   General bed mobility comments: Pt showed good effort with getting to/from EOB, needed only light assist  Transfers Overall transfer level: Needs assistance Equipment used: Rolling walker (2 wheeled) Transfers: Sit to/from Stand Sit to Stand: Min assist         General transfer comment: Again pt showed good effort with activity. Needed light assist to get to standing from standard height bed, but with heavy use of walker was able to maintain standing balance  Ambulation/Gait Ambulation/Gait assistance: Min assist Gait Distance (Feet): 5 Feet Assistive device: Rolling walker (2 wheeled)       General Gait Details: Pt able to take a few steps with walker and direct tactile/verbal  cues.  She was not overly confident and fatigued quickly, but did not have any overt LOBs and not overly guarded with R LE WBing.   Stairs            Wheelchair Mobility    Modified Rankin (Stroke Patients Only)       Balance Overall balance assessment: Needs assistance Sitting-balance support: No upper extremity supported Sitting balance-Leahy Scale: Good     Standing balance support: Bilateral upper extremity supported Standing balance-Leahy Scale: Fair Standing balance comment: reliant on walker, struggled to attain fully upright posture                             Pertinent Vitals/Pain Pain Assessment: 0-10 Pain Score: 9  Pain Location: very tender to minimal palpation in LEs R>L    Home Living Family/patient expects to be discharged to:: Skilled nursing facility                      Prior Function Level of Independence: Needs assistance   Gait / Transfers Assistance Needed: Minimal independent ambulation with walker           Hand Dominance        Extremity/Trunk Assessment   Upper Extremity Assessment Upper Extremity Assessment: Generalized weakness    Lower Extremity Assessment Lower Extremity Assessment: Generalized weakness       Communication   Communication: No difficulties  Cognition   Behavior During Therapy: WFL for tasks assessed/performed Overall Cognitive Status:  History of cognitive impairments - at baseline                                        General Comments      Exercises     Assessment/Plan    PT Assessment Patient needs continued PT services  PT Problem List Decreased strength;Decreased range of motion;Decreased activity tolerance;Decreased balance;Decreased mobility;Decreased coordination;Decreased cognition;Decreased knowledge of use of DME;Decreased safety awareness;Pain       PT Treatment Interventions DME instruction;Gait training;Stair training;Functional mobility  training;Therapeutic activities;Balance training;Therapeutic exercise;Neuromuscular re-education;Cognitive remediation;Patient/family education    PT Goals (Current goals can be found in the Care Plan section)  Acute Rehab PT Goals Patient Stated Goal: manage R LE pain PT Goal Formulation: With patient Time For Goal Achievement: 09/23/18    Frequency Min 2X/week   Barriers to discharge        Co-evaluation               AM-PAC PT "6 Clicks" Daily Activity  Outcome Measure Difficulty turning over in bed (including adjusting bedclothes, sheets and blankets)?: A Little Difficulty moving from lying on back to sitting on the side of the bed? : Unable Difficulty sitting down on and standing up from a chair with arms (e.g., wheelchair, bedside commode, etc,.)?: Unable Help needed moving to and from a bed to chair (including a wheelchair)?: A Little Help needed walking in hospital room?: A Lot Help needed climbing 3-5 steps with a railing? : A Lot 6 Click Score: 12    End of Session Equipment Utilized During Treatment: Gait belt Activity Tolerance: Patient limited by fatigue;Patient limited by pain Patient left: with bed alarm set;with nursing/sitter in room;with call bell/phone within reach Nurse Communication: Mobility status PT Visit Diagnosis: Muscle weakness (generalized) (M62.81);Difficulty in walking, not elsewhere classified (R26.2);Pain Pain - Right/Left: Right Pain - part of body: Hip    Time: 1105-1130 PT Time Calculation (min) (ACUTE ONLY): 25 min   Charges:   PT Evaluation $PT Eval Low Complexity: 1 Low          Malachi Pro, DPT 09/09/2018, 2:15 PM

## 2018-09-09 NOTE — Progress Notes (Signed)
Went down for right hip xray

## 2018-09-09 NOTE — Progress Notes (Signed)
Patient ID: [name], female   DOB: 09-11-1926, 84 y.o.   MRN: [id number]  [facility] PROGRESS NOTE  BABE ANTHIS VXY:[id number] DOB: Jan 21, 1926 DOA: 09/08/2018 PCP: Lauro Regulus, MD  HPI/Subjective: Patient complains of pain on the entire right side.  Has not noticed any blood in the bowel movements of the urine.  Patient eats a pured diet at her facility.  Objective: Vitals:   09/09/18 0532 09/09/18 1419  BP: (!) 138/54 (!) 144/50  Pulse: 91 93  Resp: 17 18  Temp: 98.4 F (36.9 C) 98.8 F (37.1 C)  SpO2: 100% 100%    Filed Weights   09/08/18 1355  Weight: 54 kg    ROS: Review of Systems  Constitutional: Negative for chills and fever.  Eyes: Negative for blurred vision.  Respiratory: Negative for cough and shortness of breath.   Cardiovascular: Negative for chest pain.  Gastrointestinal: Negative for abdominal pain, constipation, diarrhea, nausea and vomiting.  Genitourinary: Negative for dysuria.  Musculoskeletal: Positive for joint pain and myalgias.  Neurological: Negative for dizziness and headaches.   Exam: Physical Exam  HENT:  Nose: No mucosal edema.  Mouth/Throat: No oropharyngeal exudate or posterior oropharyngeal edema.  Eyes: Pupils are equal, round, and reactive to light. Conjunctivae, EOM and lids are normal.  Neck: No JVD present. Carotid bruit is not present. No edema present. No thyroid mass and no thyromegaly present.  Cardiovascular: S1 normal and S2 normal. Exam reveals no gallop.  No murmur heard. Pulses:      Dorsalis pedis pulses are 2+ on the right side, and 2+ on the left side.  Respiratory: No respiratory distress. She has no wheezes. She has no rhonchi. She has no rales.  GI: Soft. Bowel sounds are normal. There is no tenderness.  Musculoskeletal:       Right hip: She exhibits decreased range of motion and tenderness.  Lymphadenopathy:    She has no cervical adenopathy.  Neurological: She is alert. No  cranial nerve deficit.  Skin: Skin is warm. Nails show no clubbing.  Small heel decubiti left heel.  Large ulcer posterior right calf.  Psychiatric: She has a normal mood and affect.      Data Reviewed: Basic Metabolic Panel: Recent Labs  Lab 09/08/18 0620 09/08/18 1357 09/09/18 0350  NA 142 145 146*  K 3.8 4.2 4.1  CL 114* 117* 118*  CO2 20* 20* 20*  GLUCOSE 100* 124* 106*  BUN 76* 80* 73*  CREATININE 2.31* 2.38* 2.10*  CALCIUM 8.7* 9.2 9.2   Liver Function Tests: Recent Labs  Lab 09/08/18 1357  AST 22  ALT 15  ALKPHOS 142*  BILITOT 0.9  PROT 6.8  ALBUMIN 3.8    CBC: Recent Labs  Lab 09/08/18 1357 09/09/18 0350  WBC 5.6 5.3  HGB 7.0* 8.6*  HCT 21.7* 24.7*  MCV 106.9* 100.8*  PLT 280 274   Cardiac Enzymes: Recent Labs  Lab 09/08/18 1357 09/09/18 0350  CKTOTAL  --  24*  TROPONINI <0.03  --     CBG: Recent Labs  Lab 09/08/18 1412  GLUCAP 112*    Recent Results (from the past 240 hour(s))  Urine Culture     Status: Abnormal   Collection Time: 09/07/18  3:30 AM  Result Value Ref Range Status   Specimen Description   Final    URINE, RANDOM Performed at [facility], [address], [place], [place] [address]    Special Requests   Final  NONE Performed at [facility], [address] [place], [place] [address]    Culture MULTIPLE SPECIES PRESENT, SUGGEST RECOLLECTION (A)  Final   Report Status 09/08/2018 FINAL  Final     Studies: Ct Head Wo Contrast  Result Date: 09/08/2018 CLINICAL DATA:  Altered level of consciousness. EXAM: CT HEAD WITHOUT CONTRAST TECHNIQUE: Contiguous axial images were obtained from the base of the skull through the vertex without intravenous contrast. COMPARISON:  CT scan of January 13, 2018. FINDINGS: Brain: No evidence of acute infarction, hemorrhage, hydrocephalus, extra-axial collection or mass lesion/mass effect. Vascular: No hyperdense vessel or unexpected calcification. Skull: Normal.  Negative for fracture or focal lesion. Sinuses/Orbits: No acute finding. Other: None. IMPRESSION: Normal head CT. Electronically Signed   By: Lupita Raider, M.D.   On: 09/08/2018 14:41   Dg Hip Unilat With Pelvis 2-3 Views Right  Result Date: 09/09/2018 CLINICAL DATA:  RIGHT hip pain EXAM: DG HIP (WITH OR WITHOUT PELVIS) 2-3V RIGHT COMPARISON:  06/09/2017 FINDINGS: Diffuse osseous demineralization. Narrowing of the hip joints bilaterally. SI joints preserved. No acute fracture, dislocation, or bone destruction. Degenerative disc and facet disease changes at visualized lower lumbar spine. Scattered atherosclerotic calcifications. IMPRESSION: Osseous demineralization with mild degenerative changes of the hip joints and degenerative disc/facet disease changes of the lower lumbar spine. No acute abnormalities. Electronically Signed   By: Ulyses Southward M.D.   On: 09/09/2018 11:59    Scheduled Meds: . allopurinol  100 mg Oral Daily  . aspirin EC  81 mg Oral Daily  . cholecalciferol  4,000 Units Oral Daily  . collagenase   Topical Daily  . feeding supplement (ENSURE ENLIVE)  237 mL Per Tube BID BM  . fluticasone  2 spray Each Nare QHS  . heparin  5,000 Units Subcutaneous Q8H  . latanoprost  1 drop Both Eyes QHS  . loratadine  10 mg Oral Daily  . losartan  25 mg Oral Daily  . methylPREDNISolone (SOLU-MEDROL) injection  40 mg Intravenous Daily  . metoprolol tartrate  50 mg Oral BID  . mirtazapine  15 mg Oral QHS  . senna-docusate  2 tablet Oral BID  . cyanocobalamin  1,000 mcg Oral Daily  . zinc sulfate  220 mg Oral Daily   Continuous Infusions: . sodium chloride      Assessment/Plan:  1. Anemia of chronic disease.  Patient given a unit of packed red blood cells with good response.  Hemoglobin went from 7-8.6. 2. Acute kidney injury on chronic kidney disease stage III.  Nephrology felt that the patient was dry and ordered IV fluids for 10 hours which could also decrease the patient's  hemoglobin.  Creatinine went from 2.38 to 2.10.  Prior creatinines ranging from 1.3-1.7. 3. Right hip pain likely a bursitis.  Start Solu-Medrol 40 mg IV once. 4. Right lower calf wound and left heel decubiti.  Local wound care.  Heel protectors.  Would recommend following up with the wound care center. 5. Hypertension continue usual medications 6. History of dementia  Code Status:     Code Status Orders  (From admission, onward)         Start     Ordered   09/08/18 2009  Do not attempt resuscitation (DNR)  Continuous    Question Answer Comment  In the event of cardiac or respiratory ARREST Do not call a "code blue"   In the event of cardiac or respiratory ARREST Do not perform Intubation, CPR, defibrillation or ACLS  In the event of cardiac or respiratory ARREST Use medication by any route, position, wound care, and other measures to relive pain and suffering. May use oxygen, suction and manual treatment of airway obstruction as needed for comfort.   Comments nurse may pronounce      09/08/18 2008        Code Status History    Date Active Date Inactive Code Status Order ID Comments User Context   01/13/2018 1553 01/17/2018 1807 DNR [id number]  Alford Highland, MD ED    Advance Directive Documentation     Most Recent Value  Type of Advance Directive  Out of facility DNR (pink MOST or yellow form)  Pre-existing out of facility DNR order (yellow form or pink MOST form)  -  "MOST" Form in Place?  -     Family Communication: Spoke with family at the bedside Disposition Plan: Reevaluate tomorrow  Consultants:  Nephrology  Physical therapy  Time spent: 28 minutes  Shearon Clonch [employer]

## 2018-09-09 NOTE — Care Management Obs Status (Signed)
MEDICARE OBSERVATION STATUS NOTIFICATION   Patient Details  Name: [name] MRN: [id number] Date of Birth: 1926/06/02   Medicare Observation Status Notification Given:  Yes    Eber Hong, RN 09/09/2018, 8:44 PM

## 2018-09-09 NOTE — NC FL2 (Signed)
[place] MEDICAID FL2 LEVEL OF CARE SCREENING TOOL     IDENTIFICATION  Patient Name: [name] Birthdate: Mar 07, 1926 Sex: female Admission Date (Current Location): 09/08/2018  [facility] and [place] Number:  Chiropodist and Address:  [facility], [address], [place], [place] [address]      Provider Number: [id number]  Attending Physician Name and Address:  Alford Highland, MD  Relative Name and Phone Number:  Blair Heys- sister     Current Level of Care: Hospital Recommended Level of Care: Skilled Nursing Facility Prior Approval Number:    Date Approved/Denied:   PASRR Number:    Discharge Plan: SNF    Current Diagnoses: Patient Active Problem List   Diagnosis Date Noted  . Pressure injury of skin 09/09/2018  . Anemia 09/08/2018  . Chronic kidney disease (CKD), stage IV (severe) (HCC) 08/28/2018  . Constipation due to opioid therapy 08/28/2018  . Chronic seasonal allergic rhinitis 08/28/2018  . Chronic depression 08/28/2018  . Pressure ulcer caused by device 04/15/2018  . Protein-calorie malnutrition (HCC) 02/09/2018  . Hypomagnesemia 02/09/2018  . Vitamin B12 deficiency 02/09/2018  . Age-related osteoporosis with current pathological fracture with routine healing 01/18/2018  . 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 Tibia/fibula fracture, right, closed, with routine healing, subsequent encounter 01/13/2018  . Cataracts, bilateral 09/11/2015  . Chronic gout of right foot 09/11/2015  . Essential hypertension 09/11/2015  . Primary open angle glaucoma 09/11/2015  . Primary osteoarthritis 09/11/2015  . Recurrent falls 09/11/2015  . Tinnitus of right ear 09/11/2015  . Venous insufficiency of both lower extremities 09/11/2015  . Weight loss 09/11/2015    Orientation RESPIRATION BLADDER Height & Weight     Self, Place  Normal Continent Weight: 119 lb 1.6 oz (54 kg) Height:  5\' 4"  (162.6  cm)  BEHAVIORAL SYMPTOMS/MOOD NEUROLOGICAL BOWEL NUTRITION STATUS    (none) Continent Diet(Heart Healthy )  AMBULATORY STATUS COMMUNICATION OF NEEDS Skin   Extensive Assist Verbally Other (Comment)(Left posterior heel: wound measures 0.3cm x 0.2cm x 0.2cm. Right lateral lower leg: measures 3.8cm x 1cm, depth not measurable)                       Personal Care Assistance Level of Assistance  Bathing, Feeding, Dressing Bathing Assistance: Limited assistance Feeding assistance: Independent Dressing Assistance: Limited assistance     Functional Limitations Info  Sight, Hearing, Speech Sight Info: Adequate Hearing Info: Adequate Speech Info: Adequate    SPECIAL CARE FACTORS FREQUENCY                       Contractures Contractures Info: Not present    Additional Factors Info  Code Status, Allergies Code Status Info: DNR Allergies Info: NKA           Current Medications (09/09/2018):  This is the current hospital active medication list Current Facility-Administered Medications  Medication Dose Route Frequency Provider Last Rate Last Dose  . acetaminophen (TYLENOL) tablet 650 mg  650 mg Oral Q6H PRN 09/11/2018, MD       Or  . acetaminophen (TYLENOL) suppository 650 mg  650 mg Rectal Q6H PRN Pyreddy, Ihor Austin, MD      . allopurinol (ZYLOPRIM) tablet 100 mg  100 mg Oral Daily Pyreddy, Vivien Rota, MD   100 mg at 09/09/18 0954  . aspirin EC tablet 81 mg  81 mg Oral Daily Pyreddy, 09/11/18, MD   81 mg  at 09/09/18 0953  . cholecalciferol (VITAMIN D) tablet 4,000 Units  4,000 Units Oral Daily Ihor Austin, MD   4,000 Units at 09/09/18 0953  . collagenase (SANTYL) ointment   Topical Daily Wieting, Richard, MD      . feeding supplement (ENSURE ENLIVE) (ENSURE ENLIVE) liquid 237 mL  237 mL Per Tube BID BM Pyreddy, Pavan, MD      . fluticasone (FLONASE) 50 MCG/ACT nasal spray 2 spray  2 spray Each Nare QHS Ihor Austin, MD   2 spray at 09/08/18 2132  . heparin injection 5,000  Units  5,000 Units Subcutaneous Q8H Ihor Austin, MD   5,000 Units at 09/09/18 0543  . latanoprost (XALATAN) 0.005 % ophthalmic solution 1 drop  1 drop Both Eyes QHS Pyreddy, Vivien Rota, MD   1 drop at 09/08/18 2132  . loratadine (CLARITIN) tablet 10 mg  10 mg Oral Daily Pyreddy, Vivien Rota, MD   10 mg at 09/09/18 0953  . losartan (COZAAR) tablet 25 mg  25 mg Oral Daily Pyreddy, Vivien Rota, MD   25 mg at 09/09/18 0955  . metoprolol tartrate (LOPRESSOR) tablet 50 mg  50 mg Oral BID Ihor Austin, MD   50 mg at 09/09/18 0954  . mirtazapine (REMERON) tablet 15 mg  15 mg Oral QHS Ihor Austin, MD   15 mg at 09/08/18 2131  . morphine 2 MG/ML injection 2 mg  2 mg Intravenous Q4H PRN Arnaldo Natal, MD   2 mg at 09/09/18 0543  . ondansetron (ZOFRAN) tablet 4 mg  4 mg Oral Q6H PRN Ihor Austin, MD       Or  . ondansetron (ZOFRAN) injection 4 mg  4 mg Intravenous Q6H PRN Pyreddy, Pavan, MD      . oxyCODONE (Oxy IR/ROXICODONE) immediate release tablet 5 mg  5 mg Oral Q6H PRN Ihor Austin, MD   5 mg at 09/08/18 2130  . polyethylene glycol (MIRALAX / GLYCOLAX) packet 17 g  17 g Oral Daily PRN Pyreddy, Vivien Rota, MD      . senna-docusate (Senokot-S) tablet 2 tablet  2 tablet Oral BID Ihor Austin, MD   2 tablet at 09/09/18 0954  . vitamin B-12 (CYANOCOBALAMIN) tablet 1,000 mcg  1,000 mcg Oral Daily Ihor Austin, MD   1,000 mcg at 09/09/18 0954  . zinc sulfate capsule 220 mg  220 mg Oral Daily Pyreddy, Vivien Rota, MD   220 mg at 09/09/18 2956     Discharge Medications: Please see discharge summary for a list of discharge medications.  Relevant Imaging Results:  Relevant Lab Results:   Additional Information    Kiril Hippe  Rinaldo Ratel, [address]

## 2018-09-09 NOTE — Care Management (Signed)
Placed in observation from [facility] where patient is under a long term plan of care.  Presented with altered mental staus. Found to have anemia and received one unit of packed cells.  Also found to have acute  kidney injury on chronic renal disease. Her BUN and creatinine above her baseline line. Nephrology is following and patient receiving normal saline infusion for 10 hours to see if renal status improves

## 2018-09-10 DIAGNOSIS — E43 Unspecified severe protein-calorie malnutrition: Secondary | ICD-10-CM

## 2018-09-10 LAB — CBC
HCT: 23.7 % — ABNORMAL LOW (ref 36.0–46.0)
HEMOGLOBIN: 8.1 g/dL — AB (ref 12.0–15.0)
MCH: 33.9 pg (ref 26.0–34.0)
MCHC: 34.2 g/dL (ref 30.0–36.0)
MCV: 99.2 fL (ref 80.0–100.0)
PLATELETS: 244 10*3/uL (ref 150–400)
RBC: 2.39 MIL/uL — ABNORMAL LOW (ref 3.87–5.11)
RDW: 19.7 % — ABNORMAL HIGH (ref 11.5–15.5)
WBC: 4.5 10*3/uL (ref 4.0–10.5)
nRBC: 0 % (ref 0.0–0.2)

## 2018-09-10 LAB — BASIC METABOLIC PANEL
Anion gap: 9 (ref 5–15)
BUN: 58 mg/dL — AB (ref 8–23)
CHLORIDE: 117 mmol/L — AB (ref 98–111)
CO2: 21 mmol/L — ABNORMAL LOW (ref 22–32)
CREATININE: 1.75 mg/dL — AB (ref 0.44–1.00)
Calcium: 9.1 mg/dL (ref 8.9–10.3)
GFR calc Af Amer: 29 mL/min — ABNORMAL LOW (ref >60)
GFR calc non Af Amer: 25 mL/min — ABNORMAL LOW (ref >60)
GLUCOSE: 102 mg/dL — AB (ref 70–99)
Potassium: 4.3 mmol/L (ref 3.5–5.1)
SODIUM: 147 mmol/L — AB (ref 135–145)

## 2018-09-10 LAB — URIC ACID: URIC ACID, SERUM: 5.7 mg/dL (ref 2.5–7.1)

## 2018-09-10 MED ORDER — DARBEPOETIN ALFA 100 MCG/0.5ML IJ SOSY
100.0000 ug | PREFILLED_SYRINGE | Freq: Once | INTRAMUSCULAR | Status: AC
  Administered 2018-09-10: 100 ug via SUBCUTANEOUS
  Filled 2018-09-10: qty 0.5

## 2018-09-10 MED ORDER — OXYCODONE HCL 5 MG PO TABS: 5.0000 mg | ORAL_TABLET | ORAL | 0 refills | Status: DC | PRN

## 2018-09-10 MED ORDER — ASCORBIC ACID 250 MG PO TABS: 250.0000 mg | ORAL_TABLET | Freq: Two times a day (BID) | ORAL | 0 refills | Status: DC

## 2018-09-10 MED ORDER — ADULT MULTIVITAMIN W/MINERALS CH: 1.0000 | ORAL_TABLET | Freq: Every day | ORAL | 0 refills | Status: DC

## 2018-09-10 MED ORDER — COLLAGENASE 250 UNIT/GM EX OINT: TOPICAL_OINTMENT | CUTANEOUS | 0 refills | Status: DC

## 2018-09-10 MED ORDER — PREDNISONE 10 MG PO TABS: ORAL_TABLET | ORAL | 0 refills | Status: DC

## 2018-09-10 MED ORDER — ENSURE ENLIVE PO LIQD: 237.0000 mL | Freq: Two times a day (BID) | ORAL | 0 refills | Status: DC

## 2018-09-10 NOTE — Progress Notes (Signed)
Gave report to LPN  Laquitta Jonnson at [facility])  Going to room 301-B

## 2018-09-10 NOTE — Clinical Social Work Note (Signed)
Patient is medically ready for discharge today. CSW notified patient and she agrees with discharge back to [facility]. CSW contacted patient's sister Blair Heys [phone]. Sister is in agreement with discharge as well. CSW notified Ladona Ridgel at at [place] of discharge today. Patient will be transported by EMS. RN to call report and call for transport.   Ruthe Mannan MSW, [address] [phone]

## 2018-09-10 NOTE — Discharge Summary (Signed)
[facility] - [place] [facility]   PATIENT NAME: [name]    MR#:  [id number]  DATE OF BIRTH:  04-02-1926  DATE OF ADMISSION:  09/08/2018 ADMITTING PHYSICIAN: Ihor Austin, MD  DATE OF DISCHARGE: 09/10/2018  PRIMARY CARE PHYSICIAN: Lauro Regulus, MD    ADMISSION DIAGNOSIS:  Confusion [R41.0] Acute on chronic renal insufficiency [N28.9, N18.9] Anemia, unspecified type [D64.9]  DISCHARGE DIAGNOSIS:  Active Problems:   Anemia   Pressure injury of skin   Protein-calorie malnutrition, severe   SECONDARY DIAGNOSIS:   Past Medical History:  Diagnosis Date  . Age-related osteoporosis with current pathol fx with routine healing 01/18/2018  . Anxiety   . Arthritis   . At risk for falls   . Balance problems   . Benign hypertensive CKD 01/18/2018  . Cataract   . Chronic kidney disease   . 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 Depression   . Disease of thyroid gland   . Frail elderly   . Gout   . Hypertension   . Impaired mobility   . Memory loss   . Neuromuscular disorder (HCC)   . Primary open angle glaucoma 09/11/2015  . Vision problems   . Visual impairment     HOSPITAL COURSE:   1.  Anemia of chronic disease.  Patient was given a unit of packed red blood cells with good response.  Hemoglobin went to 7 to 8.6.  Because the nephrologist gave IV fluids her hemoglobin dipped down to 8.1.  I will go a dose of Aranesp.  Recommend checking hemoglobin every few weeks. 2.  Acute kidney injury on chronic kidney disease stage III.  Creatinine went from 2.38 down to 1.75.  I will hold Lasix upon getting out of the hospital.  Recommend checking a BMP monthly. 3.  Right hip bursitis versus polyarticular gout.  Add on a uric acid.  I started Solu-Medrol 40 mg IV daily while here.  Switch over to oral prednisone taper.  Her hip pain is better today than yesterday. 4.  Right lower calf wound and left heel decubiti.  Local  wound care.  Santyl ointment.  Heel protectors.  Facility can refer to the wound care center for the possibility of hyperbaric treatments. 5.  Hypertension continue usual medications 6.  History of dementia 7.  Recommend palliative care following at facility.  Patient is a DO NOT RESUSCITATE. 8.  Recommend physical therapy following at facility  DISCHARGE CONDITIONS:   Fair  CONSULTS OBTAINED:  Treatment Team:  Mosetta Pigeon, MD  DRUG ALLERGIES:  No Known Allergies  DISCHARGE MEDICATIONS:   Allergies as of 09/10/2018   No Known Allergies     Medication List    STOP taking these medications   aspirin EC 81 MG tablet   furosemide 20 MG tablet Commonly known as:  LASIX   potassium chloride 10 MEQ CR capsule Commonly known as:  MICRO-K     TAKE these medications   acetaminophen 500 MG tablet Commonly known as:  TYLENOL Take 1,000 mg by mouth 3 (three) times daily.   allopurinol 100 MG tablet Commonly known as:  ZYLOPRIM Take 100 mg by mouth daily.   ascorbic acid 250 MG tablet Commonly known as:  VITAMIN C Take 1 tablet (250 mg total) by mouth 2 (two) times daily.   ASPERCREME LIDOCAINE 4 % Ptch Generic drug:  Lidocaine Apply 1 patch topically daily.   Cholecalciferol 4000 units Caps Take 4,000 Units  by mouth daily.   collagenase ointment Commonly known as:  SANTYL To wounds   cyanocobalamin 1000 MCG tablet Take 1,000 mcg by mouth daily.   DERMACLOUD EX Apply liberal amount topically to area of skin irritation prn. OK to leave at bedside.   feeding supplement (ENSURE ENLIVE) Liqd Place 237 mLs into feeding tube 2 (two) times daily between meals. What changed:    how much to take  how to take this  when to take this   feeding supplement (PRO-STAT SUGAR FREE 64) Liqd Take 30 mLs by mouth 2 (two) times daily between meals.   fluticasone 50 MCG/ACT nasal spray Commonly known as:  FLONASE Place 2 sprays into both nostrils at bedtime.    latanoprost 0.005 % ophthalmic solution Commonly known as:  XALATAN Place 1 drop into both eyes at bedtime.   loratadine 10 MG tablet Commonly known as:  CLARITIN Take 10 mg by mouth daily.   losartan 25 MG tablet Commonly known as:  COZAAR Take 25 mg by mouth daily.   magnesium hydroxide 400 MG/5ML suspension Commonly known as:  MILK OF MAGNESIA Take 30 mLs by mouth every 4 (four) hours as needed. Constipation/ no BM for 2 days   methocarbamol 500 MG tablet Commonly known as:  ROBAXIN Take 500 mg by mouth every 6 (six) hours as needed for muscle spasms.   metoprolol tartrate 50 MG tablet Commonly known as:  LOPRESSOR Take 1 tablet (50 mg total) by mouth 2 (two) times daily.   mirtazapine 15 MG tablet Commonly known as:  REMERON Take 15 mg by mouth at bedtime.   multivitamin with minerals Tabs tablet Take 1 tablet by mouth daily.   NO-STING SKIN-PREP EX Apply topically. Apply liberal amount to the tips of the toes and B-heels BID for skin protection   oxyCODONE 5 MG immediate release tablet Commonly known as:  Oxy IR/ROXICODONE Take 1 tablet (5 mg total) by mouth every 4 (four) hours as needed. What changed:  Another medication with the same name was removed. Continue taking this medication, and follow the directions you see here.   polyethylene glycol packet Commonly known as:  MIRALAX / GLYCOLAX Take 17 g by mouth daily as needed. Mix with 4-8 ounces fluid-- for constipation   predniSONE 10 MG tablet Commonly known as:  DELTASONE Three tablets po day1; 2 tabs po day2; 1 tab po day3, 4; 1/2 tab po day5,6   sennosides-docusate sodium 8.6-50 MG tablet Commonly known as:  SENOKOT-S Take 2 tablets by mouth 2 (two) times daily.   zinc sulfate 220 (50 Zn) MG capsule Take 220 mg by mouth daily.        DISCHARGE INSTRUCTIONS:   Follow-up with Dr. rehab 1 to 2 days  If you experience worsening of your admission symptoms, develop shortness of breath, life  threatening emergency, suicidal or homicidal thoughts you must seek medical attention immediately by calling 911 or calling your MD immediately  if symptoms less severe.  You Must read complete instructions/literature along with all the possible adverse reactions/side effects for all the Medicines you take and that have been prescribed to you. Take any new Medicines after you have completely understood and accept all the possible adverse reactions/side effects.   Please note  You were cared for by a hospitalist during your hospital stay. If you have any questions about your discharge medications or the care you received while you were in the hospital after you are discharged, you can call the unit  and asked to speak with the hospitalist on call if the hospitalist that took care of you is not available. Once you are discharged, your primary care physician will handle any further medical issues. Please note that NO REFILLS for any discharge medications will be authorized once you are discharged, as it is imperative that you return to your primary care physician (or establish a relationship with a primary care physician if you do not have one) for your aftercare needs so that they can reassess your need for medications and monitor your lab values.    Today   CHIEF COMPLAINT:   Chief Complaint  Patient presents with  . Altered Mental Status    HISTORY OF PRESENT ILLNESS:  Dyana Magner  is a 84 y.o. female sent in with altered mental status.  Her major complaint was pain.   VITAL SIGNS:  Blood pressure (!) 144/53, pulse 89, temperature 98 F (36.7 C), temperature source Oral, resp. rate 17, height 5\' 4"  (1.626 m), weight 54 kg, SpO2 98 %.   PHYSICAL EXAMINATION:  GENERAL:  84 y.o.-year-old patient lying in the bed with no acute distress.  EYES: Pupils equal, round, reactive to light and accommodation. No scleral icterus. Extraocular muscles intact.  HEENT: Head atraumatic,  normocephalic. Oropharynx and nasopharynx clear.  NECK:  Supple, no jugular venous distention. No thyroid enlargement, no tenderness.  LUNGS: Normal breath sounds bilaterally, no wheezing, rales,rhonchi or crepitation. No use of accessory muscles of respiration.  CARDIOVASCULAR: S1, S2 normal. No murmurs, rubs, or gallops.  ABDOMEN: Soft, non-tender, non-distended. Bowel sounds present. No organomegaly or mass.  EXTREMITIES: No pedal edema, cyanosis, or clubbing.  NEUROLOGIC: Cranial nerves II through XII are intact. Muscle strength 5/5 in all extremities. Sensation intact. Gait not checked.  PSYCHIATRIC: The patient is alert and oriented x 3.  SKIN: Small left heel decubiti, large right posterior calf wound nonhealing.  DATA REVIEW:   CBC Recent Labs  Lab 09/10/18 0605  WBC 4.5  HGB 8.1*  HCT 23.7*  PLT 244    Chemistries  Recent Labs  Lab 09/08/18 1357  09/10/18 0605  NA 145   < > 147*  K 4.2   < > 4.3  CL 117*   < > 117*  CO2 20*   < > 21*  GLUCOSE 124*   < > 102*  BUN 80*   < > 58*  CREATININE 2.38*   < > 1.75*  CALCIUM 9.2   < > 9.1  AST 22  --   --   ALT 15  --   --   ALKPHOS 142*  --   --   BILITOT 0.9  --   --    < > = values in this interval not displayed.    Cardiac Enzymes Recent Labs  Lab 09/08/18 1357  TROPONINI <0.03    Microbiology Results  Results for orders placed or performed during the hospital encounter of 09/07/18  Urine Culture     Status: Abnormal   Collection Time: 09/07/18  3:30 AM  Result Value Ref Range Status   Specimen Description   Final    URINE, RANDOM Performed at [facility], [address], [place], [place]    Special Requests   Final    NONE Performed at [facility], [address] [place], [place]    Culture MULTIPLE SPECIES PRESENT, SUGGEST RECOLLECTION (A)  Final   Report Status 09/08/2018 FINAL  Final  RADIOLOGY:  Ct Head Wo Contrast  Result Date:  09/08/2018 CLINICAL DATA:  Altered level of consciousness. EXAM: CT HEAD WITHOUT CONTRAST TECHNIQUE: Contiguous axial images were obtained from the base of the skull through the vertex without intravenous contrast. COMPARISON:  CT scan of January 13, 2018. FINDINGS: Brain: No evidence of acute infarction, hemorrhage, hydrocephalus, extra-axial collection or mass lesion/mass effect. Vascular: No hyperdense vessel or unexpected calcification. Skull: Normal. Negative for fracture or focal lesion. Sinuses/Orbits: No acute finding. Other: None. IMPRESSION: Normal head CT. Electronically Signed   By: Lupita Raider, M.D.   On: 09/08/2018 14:41   Dg Hip Unilat With Pelvis 2-3 Views Right  Result Date: 09/09/2018 CLINICAL DATA:  RIGHT hip pain EXAM: DG HIP (WITH OR WITHOUT PELVIS) 2-3V RIGHT COMPARISON:  06/09/2017 FINDINGS: Diffuse osseous demineralization. Narrowing of the hip joints bilaterally. SI joints preserved. No acute fracture, dislocation, or bone destruction. Degenerative disc and facet disease changes at visualized lower lumbar spine. Scattered atherosclerotic calcifications. IMPRESSION: Osseous demineralization with mild degenerative changes of the hip joints and degenerative disc/facet disease changes of the lower lumbar spine. No acute abnormalities. Electronically Signed   By: Ulyses Southward M.D.   On: 09/09/2018 11:59     Management plans discussed with the patient, family and they are in agreement.  CODE STATUS:     Code Status Orders  (From admission, onward)         Start     Ordered   09/08/18 2009  Do not attempt resuscitation (DNR)  Continuous    Question Answer Comment  In the event of cardiac or respiratory ARREST Do not call a "code blue"   In the event of cardiac or respiratory ARREST Do not perform Intubation, CPR, defibrillation or ACLS   In the event of cardiac or respiratory ARREST Use medication by any route, position, wound care, and other measures to relive pain and  suffering. May use oxygen, suction and manual treatment of airway obstruction as needed for comfort.   Comments nurse may pronounce      09/08/18 2008        Code Status History    Date Active Date Inactive Code Status Order ID Comments User Context   01/13/2018 1553 01/17/2018 1807 DNR [id number]  Alford Highland, MD ED    Advance Directive Documentation     Most Recent Value  Type of Advance Directive  Out of facility DNR (pink MOST or yellow form)  Pre-existing out of facility DNR order (yellow form or pink MOST form)  -  "MOST" Form in Place?  -      TOTAL TIME TAKING CARE OF THIS PATIENT: 35 minutes.    Alford Highland M.D on 09/10/2018 at 9:35 AM  Between 7am to 6pm - Pager - [phone]  After 6pm go to [email or web address] - password [email or web address]  [employer] Office  [phone]  CC: Primary care physician; Lauro Regulus, MD

## 2018-09-10 NOTE — Progress Notes (Signed)
[facility], [place] 09/10/18  Subjective:   Some improvement in renal function.  Creatinine down to 1.75 with IV hydration Patient denies any acute complaints.  No shortness of breath   Objective:  Vital signs in last 24 hours:  Temp:  [97.6 F (36.4 C)-98 F (36.7 C)] 98 F (36.7 C) (10/18 0913) Pulse Rate:  [89-91] 89 (10/18 0913) Resp:  [16-17] 17 (10/18 0913) BP: (130-154)/(46-69) 144/53 (10/18 0913) SpO2:  [98 %-100 %] 98 % (10/18 0913)  Weight change:  Filed Weights   09/08/18 1355  Weight: 54 kg    Intake/Output:    Intake/Output Summary (Last 24 hours) at 09/10/2018 1521 Last data filed at 09/10/2018 0649 Gross per 24 hour  Intake -  Output 350 ml  Net -350 ml   Physical Exam: General:  Elderly, frail  HEENT  anicteric  Neck:  Supple  Lungs:  Normal breathing effort on room air, clear to auscultation  Heart::  Regular, no rub  Abdomen:  Soft, nontender  Extremities:  1+ edema, tender to touch bilaterally  Neurologic:  Alert, able to follow simple commands  Skin:  Normal turgor      Basic Metabolic Panel:  Recent Labs  Lab 09/08/18 0620 09/08/18 1357 09/09/18 0350 09/10/18 0605  NA 142 145 146* 147*  K 3.8 4.2 4.1 4.3  CL 114* 117* 118* 117*  CO2 20* 20* 20* 21*  GLUCOSE 100* 124* 106* 102*  BUN 76* 80* 73* 58*  CREATININE 2.31* 2.38* 2.10* 1.75*  CALCIUM 8.7* 9.2 9.2 9.1     CBC: Recent Labs  Lab 09/08/18 1357 09/09/18 0350 09/10/18 0605  WBC 5.6 5.3 4.5  HGB 7.0* 8.6* 8.1*  HCT 21.7* 24.7* 23.7*  MCV 106.9* 100.8* 99.2  PLT 280 274 244     No results found for: HEPBSAG, HEPBSAB, HEPBIGM    Microbiology:  Recent Results (from the past 240 hour(s))  Urine Culture     Status: Abnormal   Collection Time: 09/07/18  3:30 AM  Result Value Ref Range Status   Specimen Description   Final    URINE, RANDOM Performed at [facility], [address], [place], [place] [address]    Special Requests   Final    NONE Performed at [facility], [address] [place], [place] [address]    Culture MULTIPLE SPECIES PRESENT, SUGGEST RECOLLECTION (A)  Final   Report Status 09/08/2018 FINAL  Final    Coagulation Studies: No results for input(s): LABPROT, INR in the last 72 hours.  Urinalysis: Recent Labs    09/08/18 1412  COLORURINE YELLOW*  LABSPEC 1.010  PHURINE 5.0  GLUCOSEU NEGATIVE  HGBUR NEGATIVE  BILIRUBINUR NEGATIVE  KETONESUR NEGATIVE  PROTEINUR NEGATIVE  NITRITE NEGATIVE  LEUKOCYTESUR NEGATIVE      Imaging: Dg Hip Unilat With Pelvis 2-3 Views Right  Result Date: 09/09/2018 CLINICAL DATA:  RIGHT hip pain EXAM: DG HIP (WITH OR WITHOUT PELVIS) 2-3V RIGHT COMPARISON:  06/09/2017 FINDINGS: Diffuse osseous demineralization. Narrowing of the hip joints bilaterally. SI joints preserved. No acute fracture, dislocation, or bone destruction. Degenerative disc and facet disease changes at visualized lower lumbar spine. Scattered atherosclerotic calcifications. IMPRESSION: Osseous demineralization with mild degenerative changes of the hip joints and degenerative disc/facet disease changes of the lower lumbar spine. No acute abnormalities. Electronically Signed   By: Ulyses Southward M.D.   On: 09/09/2018 11:59     Medications:    . allopurinol  100 mg Oral Daily  .  aspirin EC  81 mg Oral Daily  . cholecalciferol  4,000 Units Oral Daily  . collagenase   Topical Daily  . feeding supplement (ENSURE ENLIVE)  237 mL Per Tube BID BM  . fluticasone  2 spray Each Nare QHS  . heparin  5,000 Units Subcutaneous Q8H  . latanoprost  1 drop Both Eyes QHS  . loratadine  10 mg Oral Daily  . losartan  25 mg Oral Daily  . methylPREDNISolone (SOLU-MEDROL) injection  40 mg Intravenous Daily  . metoprolol tartrate  50 mg Oral BID  . mirtazapine  15 mg Oral QHS  . multivitamin with minerals  1 tablet Oral Daily  . senna-docusate  2 tablet Oral BID  . cyanocobalamin   1,000 mcg Oral Daily  . vitamin C  250 mg Oral BID  . zinc sulfate  220 mg Oral Daily   acetaminophen **OR** acetaminophen, morphine injection, ondansetron **OR** ondansetron (ZOFRAN) IV, oxyCODONE, polyethylene glycol  Assessment/ Plan:  84 y.o. African-American female with anxiety, chronic kidney disease, dementia, hypertension, was admitted on 09/08/2018 with confusion and altered mental status.   1.  Acute renal failure Acute kidney injury is likely secondary to hypovolemia and hemodynamic changes. -Renal function improved with IV hydration Serum creatinine down to 1.75 which is close to baseline  2.  Chronic kidney disease stage III.  Baseline creatinine 1.66, GFR 30 from June 16, 2018  Continue supportive care   LOS: 0 Willie Loy 10/18/20193:21 PM  [facility] [place], [place] [phone]  Note: This note was prepared with Dragon dictation. Any transcription errors are unintentional

## 2018-09-11 LAB — TYPE AND SCREEN
ABO/RH(D): B POS
Antibody Screen: POSITIVE
DAT, IgG: POSITIVE
DAT, complement: POSITIVE
Unit division: 0
Unit division: 0
Unit division: 0
Unit tag comment: NEGATIVE
Unit tag comment: NEGATIVE

## 2018-09-11 LAB — BPAM RBC
BLOOD PRODUCT EXPIRATION DATE: 201911052359
Blood Product Expiration Date: 201911092359
Blood Product Expiration Date: 201911162359
ISSUE DATE / TIME: 201910162331
UNIT TYPE AND RH: 1700
UNIT TYPE AND RH: 9500
UNIT TYPE AND RH: 9500

## 2018-09-14 ENCOUNTER — Non-Acute Institutional Stay (SKILLED_NURSING_FACILITY): Payer: Medicare Other | Admitting: Adult Health

## 2018-09-14 ENCOUNTER — Other Ambulatory Visit
Admission: RE | Admit: 2018-09-14 | Discharge: 2018-09-14 | Disposition: A | Discharge status: Home / Self Care | Payer: Medicare Other | Source: Ambulatory Visit | Attending: Internal Medicine | Admitting: Internal Medicine

## 2018-09-14 ENCOUNTER — Encounter: Payer: Self-pay | Admitting: Adult Health

## 2018-09-14 DIAGNOSIS — N179 Acute kidney failure, unspecified: Secondary | ICD-10-CM | POA: Diagnosis not present

## 2018-09-14 DIAGNOSIS — D638 Anemia in other chronic diseases classified elsewhere: Secondary | ICD-10-CM

## 2018-09-14 DIAGNOSIS — R609 Edema, unspecified: Secondary | ICD-10-CM | POA: Insufficient documentation

## 2018-09-14 DIAGNOSIS — M109 Gout, unspecified: Secondary | ICD-10-CM

## 2018-09-14 DIAGNOSIS — M1000 Idiopathic gout, unspecified site: Secondary | ICD-10-CM | POA: Diagnosis not present

## 2018-09-14 DIAGNOSIS — I10 Essential (primary) hypertension: Secondary | ICD-10-CM | POA: Diagnosis not present

## 2018-09-14 LAB — C-REACTIVE PROTEIN

## 2018-09-14 LAB — CBC WITH DIFFERENTIAL/PLATELET
Abs Immature Granulocytes: 0.48 10*3/uL — ABNORMAL HIGH (ref 0.00–0.07)
BASOS ABS: 0 10*3/uL (ref 0.0–0.1)
Basophils Relative: 0 %
EOS ABS: 0 10*3/uL (ref 0.0–0.5)
Eosinophils Relative: 0 %
HCT: 22.1 % — ABNORMAL LOW (ref 36.0–46.0)
Hemoglobin: 7.5 g/dL — ABNORMAL LOW (ref 12.0–15.0)
IMMATURE GRANULOCYTES: 7 %
LYMPHS ABS: 3 10*3/uL (ref 0.7–4.0)
Lymphocytes Relative: 42 %
MCH: 35 pg — ABNORMAL HIGH (ref 26.0–34.0)
MCHC: 33.9 g/dL (ref 30.0–36.0)
MCV: 103.3 fL — AB (ref 80.0–100.0)
Monocytes Absolute: 1 10*3/uL (ref 0.1–1.0)
Monocytes Relative: 14 %
NEUTROS PCT: 37 %
NRBC: 1.1 % — AB (ref 0.0–0.2)
Neutro Abs: 2.7 10*3/uL (ref 1.7–7.7)
Platelets: 308 10*3/uL (ref 150–400)
RBC: 2.14 MIL/uL — ABNORMAL LOW (ref 3.87–5.11)
RDW: 18.6 % — AB (ref 11.5–15.5)
WBC: 7.2 10*3/uL (ref 4.0–10.5)

## 2018-09-14 LAB — SEDIMENTATION RATE: Sed Rate: 35 mm/hr — ABNORMAL HIGH (ref 0–30)

## 2018-09-14 NOTE — Progress Notes (Signed)
[facility] Room Number: 301-B Place of Service:  SNF (31) Provider:  Kenard Gower, NP  Patient Care Team: Lauro Regulus, MD as PCP - General (Internal Medicine)  Extended Emergency Contact Information Primary Emergency Contact: Blair Heys Home Phone: [phone] Mobile Phone: [phone] Relation: Sister  Code Status:  DNR  Goals of care: Advanced Directive information Advanced Directives 09/08/2018  Does Patient Have a Medical Advance Directive? Yes  Type of Advance Directive Out of facility DNR (pink MOST or yellow form)  Does patient want to make changes to medical advance directive? No - Patient declined  Would patient like information on creating a medical advance directive? -  Pre-existing out of facility DNR order (yellow form or pink MOST form) -     Chief Complaint  Patient presents with  . Acute Visit    Patient seen for hospital followup, status post admission at [facility] 10/16-10/18 for confusion.    HPI:  Pt is an 84 y.o. female seen today for hospital followup.  She was readmitted to [facility] following an admission at [facility] 10/16-10/18/19 for confusion, acute on chronic renal insufficiency, and anemia.  She has a PMH of anxiety, CKD, dementia, cataract, benign hypertensive CKD, thyroid disease, and visual impairment secondary to glaucoma and cataract.  She was found to have hemoglobin of 7 and was given 1 unit packed RBC.  Hemoglobin went up to 8.6.  She was given a dose of Aranesp.  She was found to have creatinine up to 2.38 and was given IV fluids and creatinine down to 1.75.  Lasix was held upon discharge.  She was given Solu-Medrol 40 mg IV daily for her right hip bursitis versus polyarticular gout then changed to oral prednisone taper.     Past Medical History:  Diagnosis Date  . Age-related osteoporosis with current pathol fx with routine healing 01/18/2018  . Anxiety   . Arthritis   . At risk  for falls   . Balance problems   . Benign hypertensive CKD 01/18/2018  . Cataract   . Chronic kidney disease   . 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 Depression   . Disease of thyroid gland   . Frail elderly   . Gout   . Hypertension   . Impaired mobility   . Memory loss   . Neuromuscular disorder (HCC)   . Primary open angle glaucoma 09/11/2015  . Vision problems   . Visual impairment    Past Surgical History:  Procedure Laterality Date  . PARTIAL HYSTERECTOMY      No Known Allergies  Outpatient Encounter Medications as of 09/14/2018  Medication Sig  . acetaminophen (TYLENOL) 500 MG tablet Take 1,000 mg by mouth 3 (three) times daily.   Marland Kitchen allopurinol (ZYLOPRIM) 100 MG tablet Take 100 mg by mouth daily.   . Amino Acids-Protein Hydrolys (FEEDING SUPPLEMENT, PRO-STAT SUGAR FREE 64,) LIQD Take 30 mLs by mouth 2 (two) times daily between meals.  . Cholecalciferol 4000 units CAPS Take 4,000 Units by mouth daily.  . collagenase (SANTYL) ointment To wounds  . cyanocobalamin 1000 MCG tablet Take 1,000 mcg by mouth daily.  . feeding supplement, ENSURE ENLIVE, (ENSURE ENLIVE) LIQD Place 237 mLs into feeding tube 2 (two) times daily between meals.  . fluticasone (FLONASE) 50 MCG/ACT nasal spray Place 2 sprays into both nostrils at bedtime.  . Infant Care Products [facility] EX) Apply liberal amount topically to area of skin irritation prn.  OK to leave at bedside.  . latanoprost (XALATAN) 0.005 % ophthalmic solution Place 1 drop into both eyes at bedtime.  . Lidocaine (ASPERCREME LIDOCAINE) 4 % PTCH Apply 1 patch topically daily.  Marland Kitchen loperamide (IMODIUM A-D) 2 MG tablet Take 2-4 mg by mouth as needed for diarrhea or loose stools. Give 2 tablets = 4 mg with first loose stool, then 1 tablet (2 mg) with each subsequent loose stool up to 8 doses in 24 hours  . loratadine (CLARITIN) 10 MG tablet Take 10 mg by mouth daily.  Marland Kitchen losartan (COZAAR) 25 MG tablet Take 25 mg  by mouth daily.  . magnesium hydroxide (MILK OF MAGNESIA) 400 MG/5ML suspension Take 30 mLs by mouth every 4 (four) hours as needed. Constipation/ no BM for 2 days  . methocarbamol (ROBAXIN) 500 MG tablet Take 500 mg by mouth every 6 (six) hours as needed for muscle spasms.  . metoprolol tartrate (LOPRESSOR) 50 MG tablet Take 1 tablet (50 mg total) by mouth 2 (two) times daily.  . mirtazapine (REMERON) 15 MG tablet Take 15 mg by mouth at bedtime.   . Multiple Vitamin (MULTIVITAMIN WITH MINERALS) TABS tablet Take 1 tablet by mouth daily.  . Nutritional Supplements (NUTRITIONAL SUPPLEMENT PO) Take 1 each by mouth 2 (two) times daily. Magic Cup with lunch and dinner  . oxyCODONE (OXY IR/ROXICODONE) 5 MG immediate release tablet Take 1 tablet (5 mg total) by mouth every 4 (four) hours as needed.  . polyethylene glycol (MIRALAX / GLYCOLAX) packet Take 17 g by mouth daily as needed. Mix with 4-8 ounces fluid-- for constipation   . sennosides-docusate sodium (SENOKOT-S) 8.6-50 MG tablet Take 2 tablets by mouth 2 (two) times daily.  . Skin Protectants, Misc. (NO-STING SKIN-PREP EX) Apply topically. Apply liberal amount to the tips of the toes and B-heels BID for skin protection  . vitamin C (VITAMIN C) 250 MG tablet Take 1 tablet (250 mg total) by mouth 2 (two) times daily.  Marland Kitchen zinc sulfate 220 (50 Zn) MG capsule Take 220 mg by mouth daily.  . [DISCONTINUED] predniSONE (DELTASONE) 10 MG tablet Three tablets po day1; 2 tabs po day2; 1 tab po day3, 4; 1/2 tab po day5,6   No facility-administered encounter medications on file as of 09/14/2018.     Review of Systems  GENERAL: No change in appetite, no fatigue, no weight changes, no fever, chills or weakness MOUTH and THROAT: Denies oral discomfort, gingival pain or bleeding, pain from teeth or hoarseness   RESPIRATORY: no cough, SOB, DOE, wheezing, hemoptysis CARDIAC: No chest pain, edema or palpitations GI: No abdominal pain, diarrhea, constipation,  heart burn, nausea or vomiting GU: Denies dysuria, frequency, hematuria, incontinence, or discharge PSYCHIATRIC: Denies feelings of depression or anxiety. No report of hallucinations, insomnia, paranoia, or agitation    Immunization History  Administered Date(s) Administered  . Influenza,inj,Quad PF,6+ Mos 09/11/2015, 10/08/2016  . Influenza,inj,quad, With Preservative 10/05/2017, 11/12/2017  . Influenza-Unspecified 08/26/2018  . PPD Test 01/17/2018  . Pneumococcal Conjugate-13 10/08/2016  . Pneumococcal Polysaccharide-23 11/12/2017   Pertinent  Health Maintenance Due  Topic Date Due  . INFLUENZA VACCINE  Completed  . DEXA SCAN  Completed  . PNA vac Low Risk Adult  Completed     Vitals:   09/14/18 1624  BP: 138/70  Pulse: 78  Resp: 13  Temp: 98.6 F (37 C)  TempSrc: Oral  SpO2: 99%  Weight: 114 lb 3.2 oz (51.8 kg)  Height: 5\' 4"  (1.626 m)   Body  mass index is 19.6 kg/m.  Physical Exam  GENERAL APPEARANCE:  In no acute distress.  SKIN:  Has ulcer on right lateral shin and left heel with dressing, no erythema MOUTH and THROAT: Lips are without lesions. Oral mucosa is moist and without lesions. Tongue is normal in shape, size, and color and without lesions RESPIRATORY: Breathing is even & unlabored, BS CTAB CARDIAC: RRR, no murmur,no extra heart sounds, no edema GI: Abdomen soft, normal BS, no masses, no tenderness EXTREMITIES:  Able to move X 4 extremities, right knee has immobilizer NEUROLOGICAL: There is no tremor. Speech is clear. Alert to self and time, disoriented to place PSYCHIATRIC:  Affect and behavior are appropriate   Labs reviewed: Recent Labs    01/26/18 0530  09/08/18 1357 09/09/18 0350 09/10/18 0605  NA 144   < > 145 146* 147*  K 3.9   < > 4.2 4.1 4.3  CL 113*   < > 117* 118* 117*  CO2 22   < > 20* 20* 21*  GLUCOSE 75   < > 124* 106* 102*  BUN 21*   < > 80* 73* 58*  CREATININE 1.36*   < > 2.38* 2.10* 1.75*  CALCIUM 8.5*   < > 9.2 9.2 9.1    MG 1.5*  --   --   --   --    < > = values in this interval not displayed.   Recent Labs    01/26/18 0530 06/16/18 0620 09/08/18 1357  AST 32 17 22  ALT 13* 7 15  ALKPHOS 138* 101 142*  BILITOT 0.9 0.6 0.9  PROT 5.6* 6.1* 6.8  ALBUMIN 2.4* 3.1* 3.8   Recent Labs    01/26/18 0530 06/16/18 0620  09/09/18 0350 09/10/18 0605 09/14/18 0630  WBC 7.8 4.9   < > 5.3 4.5 7.2  NEUTROABS 5.2 2.0  --   --   --  2.7  HGB 10.1* 11.1*   < > 8.6* 8.1* 7.5*  HCT 30.4* 33.2*   < > 24.7* 23.7* 22.1*  MCV 103.3* 100.3*   < > 100.8* 99.2 103.3*  PLT 311 226   < > 274 244 308   < > = values in this interval not displayed.   Lab Results  Component Value Date   TSH 1.729 01/26/2018    Lab Results  Component Value Date   CHOL 109 01/26/2018   HDL 38 (L) 01/26/2018   LDLCALC 53 01/26/2018   TRIG 90 01/26/2018   CHOLHDL 2.9 01/26/2018    Significant Diagnostic Results in last 30 days:  Ct Head Wo Contrast  Result Date: 09/08/2018 CLINICAL DATA:  Altered level of consciousness. EXAM: CT HEAD WITHOUT CONTRAST TECHNIQUE: Contiguous axial images were obtained from the base of the skull through the vertex without intravenous contrast. COMPARISON:  CT scan of January 13, 2018. FINDINGS: Brain: No evidence of acute infarction, hemorrhage, hydrocephalus, extra-axial collection or mass lesion/mass effect. Vascular: No hyperdense vessel or unexpected calcification. Skull: Normal. Negative for fracture or focal lesion. Sinuses/Orbits: No acute finding. Other: None. IMPRESSION: Normal head CT. Electronically Signed   By: Lupita Raider, M.D.   On: 09/08/2018 14:41   Dg Hip Unilat With Pelvis 2-3 Views Right  Result Date: 09/09/2018 CLINICAL DATA:  RIGHT hip pain EXAM: DG HIP (WITH OR WITHOUT PELVIS) 2-3V RIGHT COMPARISON:  06/09/2017 FINDINGS: Diffuse osseous demineralization. Narrowing of the hip joints bilaterally. SI joints preserved. No acute fracture, dislocation, or bone destruction.  Degenerative disc  and facet disease changes at visualized lower lumbar spine. Scattered atherosclerotic calcifications. IMPRESSION: Osseous demineralization with mild degenerative changes of the hip joints and degenerative disc/facet disease changes of the lower lumbar spine. No acute abnormalities. Electronically Signed   By: Ulyses Southward M.D.   On: 09/09/2018 11:59    Assessment/Plan  1. Anemia of chronic disease - S/P transfusion of 1 unit packed RBC and was given a dose of Aranesp, will recheck hemoglobin   2. Acute kidney injury (HCC) -creatinine 1.75, Lasix was held, will monitor    3. Polyarticular gout -was given Solu-Medrol 40 mg IV daily and was switched to oral prednisone taper, continue allopurinol 100 mg 1 tab daily   4. Essential hypertension -well-controlled, continue losartan 25 mg 1 tab daily and metoprolol tartrate 50 mg 1 tab twice a day    Family/ staff Communication: Discussed plan of care with resident.  Labs/tests ordered: CBC  Goals of care:   Long-term care.   Kenard Gower, NP [facility] [phone] (Monday-Friday 8:00 a.m. - 5:00 p.m.) [phone] (after hours)

## 2018-09-16 ENCOUNTER — Other Ambulatory Visit: Payer: Self-pay | Admitting: Ophthalmology

## 2018-09-16 ENCOUNTER — Other Ambulatory Visit
Admission: RE | Admit: 2018-09-16 | Discharge: 2018-09-16 | Disposition: A | Discharge status: Home / Self Care | Payer: Medicare Other | Source: Skilled Nursing Facility | Attending: Adult Health | Admitting: Adult Health

## 2018-09-16 DIAGNOSIS — I639 Cerebral infarction, unspecified: Secondary | ICD-10-CM

## 2018-09-16 DIAGNOSIS — I10 Essential (primary) hypertension: Secondary | ICD-10-CM | POA: Diagnosis not present

## 2018-09-16 DIAGNOSIS — M1000 Idiopathic gout, unspecified site: Secondary | ICD-10-CM | POA: Insufficient documentation

## 2018-09-16 DIAGNOSIS — F419 Anxiety disorder, unspecified: Secondary | ICD-10-CM | POA: Insufficient documentation

## 2018-09-16 DIAGNOSIS — D638 Anemia in other chronic diseases classified elsewhere: Secondary | ICD-10-CM | POA: Diagnosis present

## 2018-09-16 DIAGNOSIS — M545 Low back pain, unspecified: Secondary | ICD-10-CM | POA: Insufficient documentation

## 2018-09-16 DIAGNOSIS — Z79899 Other long term (current) drug therapy: Secondary | ICD-10-CM | POA: Insufficient documentation

## 2018-09-16 DIAGNOSIS — I129 Hypertensive chronic kidney disease with stage 1 through stage 4 chronic kidney disease, or unspecified chronic kidney disease: Secondary | ICD-10-CM | POA: Diagnosis not present

## 2018-09-16 DIAGNOSIS — N189 Chronic kidney disease, unspecified: Secondary | ICD-10-CM | POA: Insufficient documentation

## 2018-09-16 DIAGNOSIS — M1990 Unspecified osteoarthritis, unspecified site: Secondary | ICD-10-CM | POA: Insufficient documentation

## 2018-09-16 DIAGNOSIS — F329 Major depressive disorder, single episode, unspecified: Secondary | ICD-10-CM | POA: Diagnosis not present

## 2018-09-16 DIAGNOSIS — N179 Acute kidney failure, unspecified: Secondary | ICD-10-CM | POA: Insufficient documentation

## 2018-09-16 DIAGNOSIS — G8929 Other chronic pain: Secondary | ICD-10-CM | POA: Insufficient documentation

## 2018-09-16 LAB — BASIC METABOLIC PANEL
Anion gap: 4 — ABNORMAL LOW (ref 5–15)
BUN: 46 mg/dL — AB (ref 8–23)
CHLORIDE: 120 mmol/L — AB (ref 98–111)
CO2: 20 mmol/L — AB (ref 22–32)
Calcium: 8.9 mg/dL (ref 8.9–10.3)
Creatinine, Ser: 1.9 mg/dL — ABNORMAL HIGH (ref 0.44–1.00)
GFR calc non Af Amer: 22 mL/min — ABNORMAL LOW (ref >60)
GFR, EST AFRICAN AMERICAN: 26 mL/min — AB (ref >60)
Glucose, Bld: 107 mg/dL — ABNORMAL HIGH (ref 70–99)
POTASSIUM: 2.9 mmol/L — AB (ref 3.5–5.1)
Sodium: 144 mmol/L (ref 135–145)

## 2018-09-17 ENCOUNTER — Other Ambulatory Visit
Admission: RE | Admit: 2018-09-17 | Discharge: 2018-09-17 | Disposition: A | Discharge status: Home / Self Care | Payer: Medicare Other | Source: Ambulatory Visit | Attending: Adult Health | Admitting: Adult Health

## 2018-09-17 DIAGNOSIS — E876 Hypokalemia: Secondary | ICD-10-CM | POA: Diagnosis present

## 2018-09-17 LAB — BASIC METABOLIC PANEL
Anion gap: 4 — ABNORMAL LOW (ref 5–15)
BUN: 38 mg/dL — AB (ref 8–23)
CO2: 18 mmol/L — ABNORMAL LOW (ref 22–32)
CREATININE: 1.9 mg/dL — AB (ref 0.44–1.00)
Calcium: 8.5 mg/dL — ABNORMAL LOW (ref 8.9–10.3)
Chloride: 124 mmol/L — ABNORMAL HIGH (ref 98–111)
GFR calc Af Amer: 26 mL/min — ABNORMAL LOW (ref >60)
GFR calc non Af Amer: 22 mL/min — ABNORMAL LOW (ref >60)
GLUCOSE: 89 mg/dL (ref 70–99)
POTASSIUM: 4.3 mmol/L (ref 3.5–5.1)
SODIUM: 146 mmol/L — AB (ref 135–145)

## 2018-09-17 LAB — C DIFFICILE QUICK SCREEN W PCR REFLEX
C DIFFICLE (CDIFF) ANTIGEN: NEGATIVE
C Diff interpretation: NOT DETECTED
C Diff toxin: NEGATIVE

## 2018-09-21 ENCOUNTER — Inpatient Hospital Stay: Payer: Medicare Other | Attending: Oncology | Admitting: Oncology

## 2018-09-21 ENCOUNTER — Encounter: Payer: Self-pay | Admitting: Adult Health

## 2018-09-21 ENCOUNTER — Encounter: Payer: Self-pay | Admitting: Oncology

## 2018-09-21 ENCOUNTER — Other Ambulatory Visit
Admission: RE | Admit: 2018-09-21 | Discharge: 2018-09-21 | Disposition: A | Discharge status: Home / Self Care | Payer: Medicare Other | Source: Ambulatory Visit | Attending: Adult Health | Admitting: Adult Health

## 2018-09-21 ENCOUNTER — Non-Acute Institutional Stay (SKILLED_NURSING_FACILITY): Payer: Medicare Other | Admitting: Adult Health

## 2018-09-21 ENCOUNTER — Inpatient Hospital Stay: Payer: Medicare Other

## 2018-09-21 ENCOUNTER — Other Ambulatory Visit: Payer: Self-pay

## 2018-09-21 VITALS — BP 150/75 | HR 81 | Temp 99.0°F | Resp 18 | Ht 64.0 in | Wt 108.0 lb

## 2018-09-21 DIAGNOSIS — F0390 Unspecified dementia without behavioral disturbance: Secondary | ICD-10-CM | POA: Diagnosis not present

## 2018-09-21 DIAGNOSIS — D539 Nutritional anemia, unspecified: Secondary | ICD-10-CM | POA: Insufficient documentation

## 2018-09-21 DIAGNOSIS — N184 Chronic kidney disease, stage 4 (severe): Secondary | ICD-10-CM | POA: Diagnosis present

## 2018-09-21 DIAGNOSIS — D649 Anemia, unspecified: Secondary | ICD-10-CM

## 2018-09-21 DIAGNOSIS — R609 Edema, unspecified: Secondary | ICD-10-CM | POA: Diagnosis present

## 2018-09-21 DIAGNOSIS — I872 Venous insufficiency (chronic) (peripheral): Secondary | ICD-10-CM

## 2018-09-21 DIAGNOSIS — I10 Essential (primary) hypertension: Secondary | ICD-10-CM | POA: Diagnosis not present

## 2018-09-21 DIAGNOSIS — M1A0710 Idiopathic chronic gout, right ankle and foot, without tophus (tophi): Secondary | ICD-10-CM | POA: Diagnosis not present

## 2018-09-21 LAB — CBC WITH DIFFERENTIAL/PLATELET
BAND NEUTROPHILS: 5 %
BASOS PCT: 0 %
Basophils Absolute: 0 10*3/uL (ref 0.0–0.1)
EOS ABS: 0 10*3/uL (ref 0.0–0.5)
Eosinophils Relative: 0 %
HEMATOCRIT: 28.2 % — AB (ref 36.0–46.0)
Hemoglobin: 8.6 g/dL — ABNORMAL LOW (ref 12.0–15.0)
Lymphocytes Relative: 24 %
Lymphs Abs: 1.6 10*3/uL (ref 0.7–4.0)
MCH: 32.8 pg (ref 26.0–34.0)
MCHC: 30.5 g/dL (ref 30.0–36.0)
MCV: 107.6 fL — ABNORMAL HIGH (ref 80.0–100.0)
MONOS PCT: 17 %
Metamyelocytes Relative: 3 %
Monocytes Absolute: 1.1 10*3/uL — ABNORMAL HIGH (ref 0.1–1.0)
Myelocytes: 1 %
NEUTROS ABS: 3.9 10*3/uL (ref 1.7–7.7)
NRBC: 0 % (ref 0.0–0.2)
Neutrophils Relative %: 50 %
PLATELETS: 192 10*3/uL (ref 150–400)
RBC: 2.62 MIL/uL — ABNORMAL LOW (ref 3.87–5.11)
RDW: 25.9 % — ABNORMAL HIGH (ref 11.5–15.5)
WBC: 6.6 10*3/uL (ref 4.0–10.5)

## 2018-09-21 LAB — BASIC METABOLIC PANEL
ANION GAP: 5 (ref 5–15)
BUN: 43 mg/dL — ABNORMAL HIGH (ref 8–23)
CALCIUM: 8.5 mg/dL — AB (ref 8.9–10.3)
CO2: 19 mmol/L — AB (ref 22–32)
Chloride: 120 mmol/L — ABNORMAL HIGH (ref 98–111)
Creatinine, Ser: 1.7 mg/dL — ABNORMAL HIGH (ref 0.44–1.00)
GFR, EST AFRICAN AMERICAN: 30 mL/min — AB (ref >60)
GFR, EST NON AFRICAN AMERICAN: 26 mL/min — AB (ref >60)
Glucose, Bld: 94 mg/dL (ref 70–99)
Potassium: 3.6 mmol/L (ref 3.5–5.1)
Sodium: 144 mmol/L (ref 135–145)

## 2018-09-21 LAB — RETIC PANEL
Immature Retic Fract: 11.8 % (ref 2.3–15.9)
RBC.: 2.62 MIL/uL — ABNORMAL LOW (ref 3.87–5.11)
RETIC CT PCT: 2.3 % (ref 0.4–3.1)
Retic Count, Absolute: 60.3 10*3/uL (ref 19.0–186.0)
Reticulocyte Hemoglobin: 36.6 pg (ref >27.9)

## 2018-09-21 LAB — FOLATE: Folate: 14.8 ng/mL (ref >5.9)

## 2018-09-21 LAB — TSH: TSH: 0.486 u[IU]/mL (ref 0.350–4.500)

## 2018-09-21 LAB — LACTATE DEHYDROGENASE: LDH: 484 U/L — AB (ref 98–192)

## 2018-09-21 LAB — TECHNOLOGIST SMEAR REVIEW

## 2018-09-21 LAB — VITAMIN B12: VITAMIN B 12: 2220 pg/mL — AB (ref 180–914)

## 2018-09-21 NOTE — Progress Notes (Signed)
Patient here for initial visit. Sister In law present (betty).  Pt currently at [facility] for long-term care.

## 2018-09-21 NOTE — Progress Notes (Signed)
[facility]:   [facility] Room Number: 301 B Place of Service:  SNF (31)   CODE STATUS: DNR  No Known Allergies  Chief Complaint  Patient presents with  . Medical Management of Chronic Issues    Essential hypertension; venous insuffiency of both lower extremities; dementia arising in the senium and presenium; chronic gout of right foot unspecified cause.      HPI:  She is a 84 year old long term resident of this facility being seen for the management of her chronic illnesses: hypertension; venous insufficiency; dementia; gout. She denies any uncontrolled pain; no chest pain; no cough or shortness of breath. She denies any changes in appetite.   Past Medical History:  Diagnosis Date  . Age-related osteoporosis with current pathol fx with routine healing 01/18/2018  . Allergy   . Anemia   . Anxiety   . Arthritis   . At risk for falls   . Balance problems   . Benign hypertensive CKD 01/18/2018  . Bursitis of right hip   . Cataract   . Chronic kidney disease   . Decubitus ulcer of left heel, stage 3 (HCC)   . Deficiency of other specified B group vitamins   . 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 Depression   . Disease of thyroid gland   . Frail elderly   . Gout   . Hypertension   . Hypomagnesemia   . Hypopotassemia   . Impaired mobility   . Memory loss   . Muscle weakness (generalized)   . Neuromuscular disorder (HCC)   . Peripheral venous insufficiency   . Primary open angle glaucoma 09/11/2015  . Severe protein-calorie malnutrition (HCC)   . Vision problems   . Visual impairment     Past Surgical History:  Procedure Laterality Date  . PARTIAL HYSTERECTOMY      Social History   Socioeconomic History  . Marital status: Widowed    Spouse name: Ivar Drape  . Number of children: 0  . Years of education:    . Highest education level: Not on file  Occupational History  . Occupation: retired  Engineer, production  .  Financial resource strain: Not very hard  . Food insecurity:    Worry: Never true    Inability: Never true  . Transportation needs:    Medical: Not on file    Non-medical: Not on file  Tobacco Use  . Smoking status: Former Smoker    Last attempt to quit: 09/21/2002    Years since quitting: 16.0  . Smokeless tobacco: Never Used  Substance and Sexual Activity  . Alcohol use: No    Frequency: Never  . Drug use: No  . Sexual activity: Not on file  Lifestyle  . Physical activity:    Days per week: Not on file    Minutes per session: Not on file  . Stress: Not on file  Relationships  . Social connections:    Talks on phone: Not on file    Gets together: Not on file    Attends religious service: Not on file    Active member of club or organization: Not on file    Attends meetings of clubs or organizations: Not on file    Relationship status: Not on file  . Intimate partner violence:    Fear of current or ex partner: Not on file    Emotionally abused: Not on file    Physically abused: Not on  file    Forced sexual activity: Not on file  Other Topics Concern  . Not on file  Social History Narrative   Retired Garment/textile technologist   Married   No children   Former smoker   No smokeless tobacco   No alcohol use   Full Code    Family History  Problem Relation Age of Onset  . Kidney disease Mother   . Kidney disease Father   . Heart attack Father   . Diabetes Brother       VITAL SIGNS BP (!) 137/41   Pulse 84   Temp 98.5 F (36.9 C)   Resp 20   Ht 5\' 4"  (1.626 m)   Wt 114 lb 3.2 oz (51.8 kg)   SpO2 100%   BMI 19.60 kg/m   Outpatient Encounter Medications as of 09/21/2018  Medication Sig  . acetaminophen (TYLENOL) 500 MG tablet Take 1,000 mg by mouth 3 (three) times daily.   Marland Kitchen allopurinol (ZYLOPRIM) 100 MG tablet Take 100 mg by mouth daily.   . Amino Acids-Protein Hydrolys (FEEDING SUPPLEMENT, PRO-STAT SUGAR FREE 64,) LIQD Take 30 mLs by mouth 2 (two) times daily between  meals.  . Cholecalciferol 4000 units CAPS Take 4,000 Units by mouth daily.  . cyanocobalamin 1000 MCG tablet Take 1,000 mcg by mouth daily.  . feeding supplement, ENSURE ENLIVE, (ENSURE ENLIVE) LIQD Place 237 mLs into feeding tube 2 (two) times daily between meals.  . fluticasone (FLONASE) 50 MCG/ACT nasal spray Place 2 sprays into both nostrils at bedtime.  . Infant Care Products [facility] EX) Apply liberal amount topically to area of skin irritation prn. OK to leave at bedside.  . latanoprost (XALATAN) 0.005 % ophthalmic solution Place 1 drop into both eyes at bedtime.  . Lidocaine (ASPERCREME LIDOCAINE) 4 % PTCH Apply 1 patch topically daily.  Marland Kitchen loratadine (CLARITIN) 10 MG tablet Take 10 mg by mouth daily.  Marland Kitchen losartan (COZAAR) 25 MG tablet Take 25 mg by mouth daily.  . magnesium hydroxide (MILK OF MAGNESIA) 400 MG/5ML suspension Take 30 mLs by mouth every 4 (four) hours as needed. Constipation/ no BM for 2 days  . methocarbamol (ROBAXIN) 500 MG tablet Take 500 mg by mouth every 6 (six) hours as needed for muscle spasms.  . metoprolol tartrate (LOPRESSOR) 50 MG tablet Take 1 tablet (50 mg total) by mouth 2 (two) times daily.  . mirtazapine (REMERON) 15 MG tablet Take 15 mg by mouth at bedtime.   . Multiple Vitamin (MULTIVITAMIN WITH MINERALS) TABS tablet Take 1 tablet by mouth daily.  . NON FORMULARY Diet Type:  NAS  . Nutritional Supplements (NUTRITIONAL SUPPLEMENT PO) Take 1 each by mouth 2 (two) times daily. Magic Cup with lunch and dinner  . oxyCODONE (OXY IR/ROXICODONE) 5 MG immediate release tablet Take 1 tablet (5 mg total) by mouth every 4 (four) hours as needed.  . polyethylene glycol (MIRALAX / GLYCOLAX) packet Take 17 g by mouth daily as needed. Mix with 4-8 ounces fluid-- for constipation   . Skin Protectants, Misc. (NO-STING SKIN-PREP EX) Apply topically. Apply liberal amount to the tips of the toes and B-heels BID for skin protection  . vitamin C (VITAMIN C) 250 MG tablet Take 1  tablet (250 mg total) by mouth 2 (two) times daily.  Marland Kitchen zinc sulfate 220 (50 Zn) MG capsule Take 220 mg by mouth daily.  . [DISCONTINUED] collagenase (SANTYL) ointment To wounds (Patient not taking: Reported on 09/21/2018)  . [DISCONTINUED] loperamide (IMODIUM A-D) 2  MG tablet Take 2-4 mg by mouth as needed for diarrhea or loose stools. Give 2 tablets = 4 mg with first loose stool, then 1 tablet (2 mg) with each subsequent loose stool up to 8 doses in 24 hours  . [DISCONTINUED] sennosides-docusate sodium (SENOKOT-S) 8.6-50 MG tablet Take 2 tablets by mouth 2 (two) times daily.   No facility-administered encounter medications on file as of 09/21/2018.      SIGNIFICANT DIAGNOSTIC EXAMS  LABS REVIEWED PREVIOUS:   06-16-18: wbc 4.9; hgb 11.1; hct 33.2; mcv 100.3; plt 226; glucose 86; bun 38; creat 1.66; k+ 3.5; na++ 144 ca 9.0 liver normal albumin 3.1;  07-20-18: glucose 91; bun 48; creat 1.71; k+ 4.1; na++ 144; ca 9.2 PTH 42  NO NEW LABS.   Review of Systems  Constitutional: Negative for malaise/fatigue.  Respiratory: Negative for cough and shortness of breath.   Cardiovascular: Negative for chest pain, palpitations and leg swelling.  Gastrointestinal: Negative for abdominal pain, constipation and heartburn.  Musculoskeletal: Negative for back pain, joint pain and myalgias.  Skin: Negative.   Neurological: Negative for dizziness.  Psychiatric/Behavioral: The patient is not nervous/anxious.      Physical Exam  Constitutional: She is oriented to person, place, and time. No distress.  Frail   Neck: No thyromegaly present.  Cardiovascular: Normal rate, regular rhythm, normal heart sounds and intact distal pulses.  Pulmonary/Chest: Effort normal and breath sounds normal. No respiratory distress.  Abdominal: Soft. Bowel sounds are normal. She exhibits no distension. There is no tenderness.  Musculoskeletal: She exhibits no edema.  Is able to move all extremities   Lymphadenopathy:    She  has no cervical adenopathy.  Neurological: She is alert and oriented to person, place, and time.  Skin: Skin is warm and dry. She is not diaphoretic.  Psychiatric: She has a normal mood and affect.       ASSESSMENT/ PLAN:  TODAY:   1. Essential hypertension: is stable b/p 137/41 cozaar 25 mg daily and lopressor 50 mg twice daily   2.  Venous insufficiency of both lower extremities: will continue asa 81 mg daily   3. Dementia arising in the senium and presenium: is without change weight is 114 pounds; will monitor   4.  Chronic gout of right foot: is stable will continue allopurinol 100 mg daily   PREVIOUS   5. Primary osteoarthritis multiple joints: is stable will continue tylenol 1 gm three times daily   6. Chronic kidney disease stage IV: is stable bun 48 creat 1.71  7. Tibia/fibula fracture, right, closed with routine healing subsequent encounter: is stable will continue tylenol 1 gm three times daily will change oxycodone 5 mg to three times daily and will keep every 4 hours prn.   8.  Bilateral open angle glaucoma: is stable will continue xalatan to both eyes nightly   9. Moderate protein-calorie malnutrition: albumin 3.1; will continue prostat and supplements as directed  10. Constipation due to opioid therapy: is stable will continue senna s 2 tabs twice daily ans miralax daily as needed  11. Chronic seasonal allergic rhinitis: is stable will continue claritin 10 mg daily and flonase daily   12. Chronic depression: is stable will continue remeron 15 mg nightly   13. Right side back pain: is stable; will continue tylenol 1 gm three times daily and will continue oxycodone  5 mg three times daily and every 4 hours as needed.   MD is aware of resident's narcotic use and is in  agreement with current plan of care. We will attempt to wean resident as apropriate   Synthia Innocent NP [facility]  Contact [phone] Monday through Friday 8am- 5pm  After hours call  [phone]

## 2018-09-22 NOTE — Progress Notes (Addendum)
Hematology/Oncology Consult note [facility] Telephone:([phone] [phone]   Patient Care Team: Kirk Ruths, MD as PCP - General (Internal Medicine)  REFERRING PROVIDER: Kirk Ruths, MD CHIEF COMPLAINTS/REASON FOR VISIT:  Evaluation of anemia  HISTORY OF PRESENTING ILLNESS:  [name] is a  84 y.o.  female with PMH listed below who was referred to me for evaluation of anemia Reviewed patient's recent labs that was done during her recent admission for confusion, acute on chronic renal insufficiency, and anemia.  Her hemoglobin was found to be 7, received 1 unit of PRBC with good response.  Hemoglobin improved to 8.6, at discharge patient's hemoglobin was 8.1. She also received 1 dose of Aranesp.  Patient lives in long-term care.  She has a history of chronic kidney disease  Reviewed patient's previous labs ordered by primary care physician's office, anemia is chronic onset , duration is since February 2019.  Associated signs and symptoms: Patient reports fatigue.  Mild SOB with exertion.  Denies weight loss, easy bruising, hematochezia, hemoptysis, hematuria. Context: History of GI bleeding: Denies               History of Chronic kidney disease: CKD               History of autoimmune disease denies               History of hemolytic anemia.  Denies         Review of Systems  Constitutional: Positive for malaise/fatigue. Negative for chills and fever.  HENT: Negative for nosebleeds and sore throat.   Eyes: Negative for double vision, photophobia and redness.  Respiratory: Negative for cough, shortness of breath and wheezing.   Cardiovascular: Negative for chest pain, palpitations and orthopnea.  Gastrointestinal: Positive for diarrhea. Negative for abdominal pain, blood in stool, nausea and vomiting.  Genitourinary: Negative for dysuria.  Musculoskeletal: Negative for back pain, myalgias and neck pain.  Skin:  Negative for itching and rash.  Neurological: Negative for dizziness, tingling and tremors.  Endo/Heme/Allergies: Negative for environmental allergies. Does not bruise/bleed easily.  Psychiatric/Behavioral: Negative for depression.    MEDICAL HISTORY:  Past Medical History:  Diagnosis Date  . Age-related osteoporosis with current pathol fx with routine healing 01/18/2018  . Allergy   . Anemia   . Anxiety   . Arthritis   . At risk for falls   . Balance problems   . Benign hypertensive CKD 01/18/2018  . Bursitis of right hip   . Cataract   . Chronic kidney disease   . Decubitus ulcer of left heel, stage 3 ([place])   . Deficiency of other specified B group vitamins   . Dementia arising in the senium and presenium ([place]) 01/18/2018  . Depression   . Disease of thyroid gland   . Frail elderly   . Gout   . Hypertension   . Hypomagnesemia   . Hypopotassemia   . Impaired mobility   . Memory loss   . Muscle weakness (generalized)   . Neuromuscular disorder ([place])   . Peripheral venous insufficiency   . Primary open angle glaucoma 09/11/2015  . Severe protein-calorie malnutrition ([place])   . Vision problems   . Visual impairment     SURGICAL HISTORY: Past Surgical History:  Procedure Laterality Date  . PARTIAL HYSTERECTOMY      SOCIAL HISTORY: Social History   Socioeconomic History  . Marital status: Widowed    Spouse name: Izell Lewiston  .  Number of children: 0  . Years of education:    . Highest education level: Not on file  Occupational History  . Occupation: retired  Scientific laboratory technician  . Financial resource strain: Not very hard  . Food insecurity:    Worry: Never true    Inability: Never true  . Transportation needs:    Medical: Not on file    Non-medical: Not on file  Tobacco Use  . Smoking status: Former Smoker    Last attempt to quit: 09/21/2002    Years since quitting: 16.0  . Smokeless tobacco: Never Used  Substance and Sexual Activity  . Alcohol use: No     Frequency: Never  . Drug use: No  . Sexual activity: Not on file  Lifestyle  . Physical activity:    Days per week: Not on file    Minutes per session: Not on file  . Stress: Not on file  Relationships  . Social connections:    Talks on phone: Not on file    Gets together: Not on file    Attends religious service: Not on file    Active member of club or organization: Not on file    Attends meetings of clubs or organizations: Not on file    Relationship status: Not on file  . Intimate partner violence:    Fear of current or ex partner: Not on file    Emotionally abused: Not on file    Physically abused: Not on file    Forced sexual activity: Not on file  Other Topics Concern  . Not on file  Social History Narrative   Retired English as a second language teacher   Married   No children   Former smoker   No smokeless tobacco   No alcohol use   Full Code     FAMILY HISTORY: Family History  Problem Relation Age of Onset  . Kidney disease Mother   . Kidney disease Father   . Heart attack Father   . Diabetes Brother     ALLERGIES:  has No Known Allergies.  MEDICATIONS:  Current Outpatient Medications  Medication Sig Dispense Refill  . acetaminophen (TYLENOL) 500 MG tablet Take 1,000 mg by mouth 3 (three) times daily.     Marland Kitchen allopurinol (ZYLOPRIM) 100 MG tablet Take 100 mg by mouth daily.     . Amino Acids-Protein Hydrolys (FEEDING SUPPLEMENT, PRO-STAT SUGAR FREE 64,) LIQD Take 30 mLs by mouth 2 (two) times daily between meals.    . Cholecalciferol 4000 units CAPS Take 4,000 Units by mouth daily.    . cyanocobalamin 1000 MCG tablet Take 1,000 mcg by mouth daily.    . feeding supplement, ENSURE ENLIVE, (ENSURE ENLIVE) LIQD Place 237 mLs into feeding tube 2 (two) times daily between meals. 60 Bottle 0  . fluticasone (FLONASE) 50 MCG/ACT nasal spray Place 2 sprays into both nostrils at bedtime.    . Infant Care Products [facility] EX) Apply liberal amount topically to area of skin irritation prn. OK  to leave at bedside.    . latanoprost (XALATAN) 0.005 % ophthalmic solution Place 1 drop into both eyes at bedtime.    . Lidocaine (ASPERCREME LIDOCAINE) 4 % PTCH Apply 1 patch topically daily.    Marland Kitchen loratadine (CLARITIN) 10 MG tablet Take 10 mg by mouth daily.    Marland Kitchen losartan (COZAAR) 25 MG tablet Take 25 mg by mouth daily.    . magnesium hydroxide (MILK OF MAGNESIA) 400 MG/5ML suspension Take 30 mLs by mouth every 4 (  four) hours as needed. Constipation/ no BM for 2 days    . methocarbamol (ROBAXIN) 500 MG tablet Take 500 mg by mouth every 6 (six) hours as needed for muscle spasms.    . metoprolol tartrate (LOPRESSOR) 50 MG tablet Take 1 tablet (50 mg total) by mouth 2 (two) times daily. 30 tablet 0  . mirtazapine (REMERON) 15 MG tablet Take 15 mg by mouth at bedtime.     . Multiple Vitamin (MULTIVITAMIN WITH MINERALS) TABS tablet Take 1 tablet by mouth daily. 30 tablet 0  . oxyCODONE (OXY IR/ROXICODONE) 5 MG immediate release tablet Take 1 tablet (5 mg total) by mouth every 4 (four) hours as needed. 15 tablet 0  . polyethylene glycol (MIRALAX / GLYCOLAX) packet Take 17 g by mouth daily as needed. Mix with 4-8 ounces fluid-- for constipation     . Skin Protectants, Misc. (NO-STING SKIN-PREP EX) Apply topically. Apply liberal amount to the tips of the toes and B-heels BID for skin protection    . vitamin C (VITAMIN C) 250 MG tablet Take 1 tablet (250 mg total) by mouth 2 (two) times daily. 60 tablet 0  . zinc sulfate 220 (50 Zn) MG capsule Take 220 mg by mouth daily.    . NON FORMULARY Diet Type:  NAS    . Nutritional Supplements (NUTRITIONAL SUPPLEMENT PO) Take 1 each by mouth 2 (two) times daily. Magic Cup with lunch and dinner     No current facility-administered medications for this visit.      PHYSICAL EXAMINATION: ECOG PERFORMANCE STATUS: 3 - Symptomatic, >50% confined to bed Vitals:   09/21/18 1123  BP: (!) 150/75  Pulse: 81  Resp: 18  Temp: 99 F (37.2 C)   Filed Weights    09/21/18 1123  Weight: 108 lb (49 kg)    Physical Exam  Constitutional: She is oriented to person, place, and time. No distress.  Thin elderly female sitting in wheelchair.  HENT:  Head: Normocephalic and atraumatic.  Mouth/Throat: Oropharynx is clear and moist.  Eyes: Pupils are equal, round, and reactive to light. EOM are normal. No scleral icterus.  Pale conjunctivae  Neck: Normal range of motion. Neck supple.  Cardiovascular: Normal heart sounds.  Pulmonary/Chest: Effort normal. No respiratory distress. She has no wheezes.  Abdominal: Soft. Bowel sounds are normal. She exhibits no distension and no mass. There is no tenderness.  Musculoskeletal: Normal range of motion. She exhibits no edema or deformity.  Neurological: She is alert and oriented to person, place, and time. No cranial nerve deficit. Coordination normal.  Skin: Skin is warm and dry. No rash noted. No erythema.  Psychiatric: She has a normal mood and affect.     LABORATORY DATA:  I have reviewed the data as listed Lab Results  Component Value Date   WBC 6.6 09/21/2018   HGB 8.6 (L) 09/21/2018   HCT 28.2 (L) 09/21/2018   MCV 107.6 (H) 09/21/2018   PLT 192 09/21/2018   Recent Labs    01/26/18 0530 06/16/18 0620  09/08/18 1357  09/16/18 1110 09/17/18 0623 09/21/18 0635  NA 144 144   < > 145   < > 144 146* 144  K 3.9 3.5   < > 4.2   < > 2.9* 4.3 3.6  CL 113* 119*   < > 117*   < > 120* 124* 120*  CO2 22 22   < > 20*   < > 20* 18* 19*  GLUCOSE 75 86   < >  124*   < > 107* 89 94  BUN 21* 38*   < > 80*   < > 46* 38* 43*  CREATININE 1.36* 1.66*   < > 2.38*   < > 1.90* 1.90* 1.70*  CALCIUM 8.5* 9.0   < > 9.2   < > 8.9 8.5* 8.5*  GFRNONAA 33* 26*   < > 17*   < > 22* 22* 26*  GFRAA 39* 30*   < > 20*   < > 26* 26* 30*  PROT 5.6* 6.1*  --  6.8  --   --   --   --   ALBUMIN 2.4* 3.1*  --  3.8  --   --   --   --   AST 32 17  --  22  --   --   --   --   ALT 13* 7  --  15  --   --   --   --   ALKPHOS 138* 101  --   142*  --   --   --   --   BILITOT 0.9 0.6  --  0.9  --   --   --   --    < > = values in this interval not displayed.   Iron/TIBC/Ferritin/ %Sat    Component Value Date/Time   FERRITIN 462 (H) 09/09/2018 0350        ASSESSMENT & PLAN:  1. Macrocytic anemia   2. Chronic kidney disease (CKD), stage IV (severe) (HCC)     Anemia: multifactorial with possible causes including chronic blood loss, hyper/hypothyroidism, nutritional deficiency, infection/chronic inflammation, hemolysis, underlying bone marrow disorders. Will check CBC w differential, CMP, vitamin B12, Folate,  Reticulocytes, blood smear, TSH,  LDH, monoclonal gammopathy evaluation.    I discussed with patient and her sister-in-law.  Anemia may be due to chronic kidney disease, but I am more concerned with her macrocytic anemia. Vitamin B12 was low previously, she is taking vitamin b12 supplements. ? MDS. Vs hemolysis. Check haptoglobin, LFT.   Labs reviewed.  Vitamin B12 level 2220, normal folate level Reticulocyte panel showed normal reticulocyte hemoglobin indicating adequate iron store, normal TSH CBC showed hemoglobin 8.6, MCV 107.6, smear showed mild left shift 1 to 5% of M[place] and myelos occasional bands notified. Previous smear showed teardrop cells, polychromasia, basophilic stippling Elevated LDH 484 Multiple myeloma labs pending.    Orders Placed This Encounter  Procedures  . CBC with Differential/Platelet    Standing Status:   Future    Number of Occurrences:   1    Standing Expiration Date:   09/22/2019  . Technologist smear review    Standing Status:   Future    Number of Occurrences:   1    Standing Expiration Date:   09/22/2019  . Vitamin B12    Standing Status:   Future    Number of Occurrences:   1    Standing Expiration Date:   09/22/2019  . Folate    Standing Status:   Future    Number of Occurrences:   1    Standing Expiration Date:   09/22/2019  . Retic Panel    Standing Status:   Future     Number of Occurrences:   1    Standing Expiration Date:   09/22/2019  . TSH    Standing Status:   Future    Number of Occurrences:   1    Standing Expiration Date:   09/22/2019  .  Multiple Myeloma Panel (SPEP&IFE w/QIG)    Standing Status:   Future    Number of Occurrences:   1    Standing Expiration Date:   09/21/2019  . Lactate dehydrogenase    Standing Status:   Future    Number of Occurrences:   1    Standing Expiration Date:   09/22/2019    All questions were answered. The patient knows to call the clinic with any problems questions or concerns.  Return of visit: 2 weeks Thank you for this kind referral and the opportunity to participate in the care of this patient. A copy of today's note is routed to referring provider  Total face to face encounter time for this patient visit was 73mn. >50% of the time was  spent in counseling and coordination of care.    ZEarlie Server MD, PhD Hematology Oncology CSumma Wadsworth-Rittman Hospitalat ASurgery Center At St [facility] Dba East Pavilion Surgery CenterPager- [phone]/30/2019

## 2018-09-23 NOTE — Addendum Note (Signed)
Addended by: Rickard Patience on: 09/23/2018 08:42 AM   Modules accepted: Orders

## 2018-09-24 ENCOUNTER — Other Ambulatory Visit
Admission: RE | Admit: 2018-09-24 | Discharge: 2018-09-24 | Disposition: A | Discharge status: Home / Self Care | Payer: Medicare Other | Source: Skilled Nursing Facility | Attending: Internal Medicine | Admitting: Internal Medicine

## 2018-09-24 ENCOUNTER — Encounter: Admission: RE | Admit: 2018-09-24 | Payer: Medicare Other | Source: Ambulatory Visit | Admitting: Internal Medicine

## 2018-09-24 DIAGNOSIS — N189 Chronic kidney disease, unspecified: Secondary | ICD-10-CM | POA: Diagnosis present

## 2018-09-24 DIAGNOSIS — D649 Anemia, unspecified: Secondary | ICD-10-CM | POA: Diagnosis present

## 2018-09-24 LAB — MULTIPLE MYELOMA PANEL, SERUM
ALBUMIN SERPL ELPH-MCNC: 3.6 g/dL (ref 2.9–4.4)
Albumin/Glob SerPl: 1.3 (ref 0.7–1.7)
Alpha 1: 0.2 g/dL (ref 0.0–0.4)
Alpha2 Glob SerPl Elph-Mcnc: 0.6 g/dL (ref 0.4–1.0)
B-Globulin SerPl Elph-Mcnc: 0.8 g/dL (ref 0.7–1.3)
GAMMA GLOB SERPL ELPH-MCNC: 1.3 g/dL (ref 0.4–1.8)
GLOBULIN, TOTAL: 2.9 g/dL (ref 2.2–3.9)
IgA: 443 mg/dL — ABNORMAL HIGH (ref 64–422)
IgG (Immunoglobin G), Serum: 1218 mg/dL (ref 700–1600)
IgM (Immunoglobulin M), Srm: 92 mg/dL (ref 26–217)
Total Protein ELP: 6.5 g/dL (ref 6.0–8.5)

## 2018-09-24 LAB — HEPATIC FUNCTION PANEL
ALBUMIN: 3.2 g/dL — AB (ref 3.5–5.0)
ALT: 14 U/L (ref 0–44)
AST: 41 U/L (ref 15–41)
Alkaline Phosphatase: 107 U/L (ref 38–126)
Bilirubin, Direct: 0.5 mg/dL — ABNORMAL HIGH (ref 0.0–0.2)
Indirect Bilirubin: 1.5 mg/dL — ABNORMAL HIGH (ref 0.3–0.9)
TOTAL PROTEIN: 6.1 g/dL — AB (ref 6.5–8.1)
Total Bilirubin: 2 mg/dL — ABNORMAL HIGH (ref 0.3–1.2)

## 2018-09-26 ENCOUNTER — Emergency Department: Payer: Medicare Other

## 2018-09-26 ENCOUNTER — Inpatient Hospital Stay
Admission: EM | Admit: 2018-09-26 | Discharge: 2018-09-28 | DRG: 871 | Disposition: A | Discharge status: Hospice | Payer: Medicare Other | Attending: Internal Medicine | Admitting: Internal Medicine

## 2018-09-26 ENCOUNTER — Other Ambulatory Visit: Payer: Self-pay

## 2018-09-26 DIAGNOSIS — Z681 Body mass index (BMI) 19 or less, adult: Secondary | ICD-10-CM

## 2018-09-26 DIAGNOSIS — E46 Unspecified protein-calorie malnutrition: Secondary | ICD-10-CM

## 2018-09-26 DIAGNOSIS — Z515 Encounter for palliative care: Secondary | ICD-10-CM | POA: Diagnosis not present

## 2018-09-26 DIAGNOSIS — Z66 Do not resuscitate: Secondary | ICD-10-CM | POA: Diagnosis present

## 2018-09-26 DIAGNOSIS — M109 Gout, unspecified: Secondary | ICD-10-CM | POA: Diagnosis present

## 2018-09-26 DIAGNOSIS — D7589 Other specified diseases of blood and blood-forming organs: Secondary | ICD-10-CM | POA: Diagnosis present

## 2018-09-26 DIAGNOSIS — N184 Chronic kidney disease, stage 4 (severe): Secondary | ICD-10-CM | POA: Diagnosis present

## 2018-09-26 DIAGNOSIS — D649 Anemia, unspecified: Secondary | ICD-10-CM

## 2018-09-26 DIAGNOSIS — E43 Unspecified severe protein-calorie malnutrition: Secondary | ICD-10-CM | POA: Diagnosis present

## 2018-09-26 DIAGNOSIS — D62 Acute posthemorrhagic anemia: Secondary | ICD-10-CM | POA: Diagnosis present

## 2018-09-26 DIAGNOSIS — J189 Pneumonia, unspecified organism: Secondary | ICD-10-CM | POA: Diagnosis present

## 2018-09-26 DIAGNOSIS — M810 Age-related osteoporosis without current pathological fracture: Secondary | ICD-10-CM | POA: Diagnosis present

## 2018-09-26 DIAGNOSIS — F0390 Unspecified dementia without behavioral disturbance: Secondary | ICD-10-CM | POA: Diagnosis present

## 2018-09-26 DIAGNOSIS — L89623 Pressure ulcer of left heel, stage 3: Secondary | ICD-10-CM | POA: Diagnosis present

## 2018-09-26 DIAGNOSIS — N179 Acute kidney failure, unspecified: Secondary | ICD-10-CM | POA: Diagnosis present

## 2018-09-26 DIAGNOSIS — K922 Gastrointestinal hemorrhage, unspecified: Secondary | ICD-10-CM | POA: Diagnosis not present

## 2018-09-26 DIAGNOSIS — Z7951 Long term (current) use of inhaled steroids: Secondary | ICD-10-CM

## 2018-09-26 DIAGNOSIS — E870 Hyperosmolality and hypernatremia: Secondary | ICD-10-CM | POA: Diagnosis present

## 2018-09-26 DIAGNOSIS — Z90711 Acquired absence of uterus with remaining cervical stump: Secondary | ICD-10-CM | POA: Diagnosis not present

## 2018-09-26 DIAGNOSIS — D539 Nutritional anemia, unspecified: Secondary | ICD-10-CM | POA: Diagnosis present

## 2018-09-26 DIAGNOSIS — A419 Sepsis, unspecified organism: Secondary | ICD-10-CM | POA: Diagnosis present

## 2018-09-26 DIAGNOSIS — D696 Thrombocytopenia, unspecified: Secondary | ICD-10-CM

## 2018-09-26 DIAGNOSIS — I872 Venous insufficiency (chronic) (peripheral): Secondary | ICD-10-CM | POA: Diagnosis present

## 2018-09-26 DIAGNOSIS — Z87891 Personal history of nicotine dependence: Secondary | ICD-10-CM

## 2018-09-26 DIAGNOSIS — R69 Illness, unspecified: Secondary | ICD-10-CM

## 2018-09-26 DIAGNOSIS — K625 Hemorrhage of anus and rectum: Secondary | ICD-10-CM | POA: Diagnosis present

## 2018-09-26 DIAGNOSIS — M1990 Unspecified osteoarthritis, unspecified site: Secondary | ICD-10-CM | POA: Diagnosis present

## 2018-09-26 DIAGNOSIS — Z841 Family history of disorders of kidney and ureter: Secondary | ICD-10-CM

## 2018-09-26 DIAGNOSIS — I129 Hypertensive chronic kidney disease with stage 1 through stage 4 chronic kidney disease, or unspecified chronic kidney disease: Secondary | ICD-10-CM | POA: Diagnosis present

## 2018-09-26 DIAGNOSIS — G9341 Metabolic encephalopathy: Secondary | ICD-10-CM | POA: Diagnosis present

## 2018-09-26 DIAGNOSIS — R4182 Altered mental status, unspecified: Secondary | ICD-10-CM | POA: Diagnosis not present

## 2018-09-26 DIAGNOSIS — D631 Anemia in chronic kidney disease: Secondary | ICD-10-CM | POA: Diagnosis present

## 2018-09-26 DIAGNOSIS — R195 Other fecal abnormalities: Secondary | ICD-10-CM | POA: Diagnosis not present

## 2018-09-26 DIAGNOSIS — Z79899 Other long term (current) drug therapy: Secondary | ICD-10-CM

## 2018-09-26 LAB — URINALYSIS, ROUTINE W REFLEX MICROSCOPIC
Bilirubin Urine: NEGATIVE
Glucose, UA: NEGATIVE mg/dL
Hgb urine dipstick: NEGATIVE
Ketones, ur: NEGATIVE mg/dL
LEUKOCYTES UA: NEGATIVE
NITRITE: NEGATIVE
PH: 6 (ref 5.0–8.0)
Protein, ur: NEGATIVE mg/dL
SPECIFIC GRAVITY, URINE: 1.012 (ref 1.005–1.030)

## 2018-09-26 LAB — CBC WITH DIFFERENTIAL/PLATELET
ABS IMMATURE GRANULOCYTES: 0.13 10*3/uL — AB (ref 0.00–0.07)
Basophils Absolute: 0 10*3/uL (ref 0.0–0.1)
Basophils Relative: 0 %
EOS PCT: 0 %
Eosinophils Absolute: 0 10*3/uL (ref 0.0–0.5)
HCT: 14.3 % — CL (ref 36.0–46.0)
Hemoglobin: 4.2 g/dL — CL (ref 12.0–15.0)
Immature Granulocytes: 3 %
LYMPHS PCT: 32 %
Lymphs Abs: 1.6 10*3/uL (ref 0.7–4.0)
MCH: 35.3 pg — ABNORMAL HIGH (ref 26.0–34.0)
MCHC: 29.4 g/dL — AB (ref 30.0–36.0)
MCV: 120.2 fL — ABNORMAL HIGH (ref 80.0–100.0)
MONO ABS: 1.1 10*3/uL — AB (ref 0.1–1.0)
Monocytes Relative: 22 %
Neutro Abs: 2.2 10*3/uL (ref 1.7–7.7)
Neutrophils Relative %: 43 %
PLATELETS: 140 10*3/uL — AB (ref 150–400)
RBC: 1.19 MIL/uL — ABNORMAL LOW (ref 3.87–5.11)
Smear Review: NORMAL
WBC: 5 10*3/uL (ref 4.0–10.5)
nRBC: 0.4 % — ABNORMAL HIGH (ref 0.0–0.2)

## 2018-09-26 LAB — COMPREHENSIVE METABOLIC PANEL
ALBUMIN: 3.7 g/dL (ref 3.5–5.0)
ALT: 19 U/L (ref 0–44)
ANION GAP: 8 (ref 5–15)
AST: 35 U/L (ref 15–41)
Alkaline Phosphatase: 140 U/L — ABNORMAL HIGH (ref 38–126)
BUN: 55 mg/dL — ABNORMAL HIGH (ref 8–23)
CHLORIDE: 123 mmol/L — AB (ref 98–111)
CO2: 16 mmol/L — AB (ref 22–32)
Calcium: 9.5 mg/dL (ref 8.9–10.3)
Creatinine, Ser: 2.08 mg/dL — ABNORMAL HIGH (ref 0.44–1.00)
GFR calc Af Amer: 23 mL/min — ABNORMAL LOW (ref >60)
GFR calc non Af Amer: 20 mL/min — ABNORMAL LOW (ref >60)
GLUCOSE: 139 mg/dL — AB (ref 70–99)
POTASSIUM: 3.9 mmol/L (ref 3.5–5.1)
SODIUM: 147 mmol/L — AB (ref 135–145)
Total Bilirubin: 1.4 mg/dL — ABNORMAL HIGH (ref 0.3–1.2)
Total Protein: 6.9 g/dL (ref 6.5–8.1)

## 2018-09-26 LAB — BLOOD GAS, ARTERIAL
ACID-BASE DEFICIT: 9 mmol/L — AB (ref 0.0–2.0)
Bicarbonate: 14.2 mmol/L — ABNORMAL LOW (ref 20.0–28.0)
FIO2: 0.21
O2 SAT: 96.7 %
PATIENT TEMPERATURE: 37
PO2 ART: 89 mmHg (ref 83.0–108.0)
pCO2 arterial: 24 mmHg — ABNORMAL LOW (ref 32.0–48.0)
pH, Arterial: 7.38 (ref 7.350–7.450)

## 2018-09-26 LAB — TROPONIN I: Troponin I: 0.03 ng/mL (ref <0.03)

## 2018-09-26 LAB — RETIC PANEL
Immature Retic Fract: 9.1 % (ref 2.3–15.9)
RBC.: 2.06 MIL/uL — AB (ref 3.87–5.11)
RETIC CT PCT: 1.1 % (ref 0.4–3.1)
RETICULOCYTE HEMOGLOBIN: 33.8 pg (ref >27.9)
Retic Count, Absolute: 22.2 10*3/uL (ref 19.0–186.0)

## 2018-09-26 LAB — PROCALCITONIN: Procalcitonin: 0.46 ng/mL

## 2018-09-26 LAB — PROTIME-INR
INR: 1.25
PROTHROMBIN TIME: 15.6 s — AB (ref 11.4–15.2)

## 2018-09-26 LAB — LACTIC ACID, PLASMA
LACTIC ACID, VENOUS: 1.8 mmol/L (ref 0.5–1.9)
LACTIC ACID, VENOUS: 2.3 mmol/L — AB (ref 0.5–1.9)
LACTIC ACID, VENOUS: 2.6 mmol/L — AB (ref 0.5–1.9)

## 2018-09-26 LAB — LACTATE DEHYDROGENASE: LDH: 854 U/L — AB (ref 98–192)

## 2018-09-26 LAB — PREPARE RBC (CROSSMATCH)

## 2018-09-26 LAB — FIBRINOGEN: Fibrinogen: 266 mg/dL (ref 210–475)

## 2018-09-26 LAB — HEMOGLOBIN: HEMOGLOBIN: 7.9 g/dL — AB (ref 12.0–15.0)

## 2018-09-26 LAB — APTT: aPTT: 33 seconds (ref 24–36)

## 2018-09-26 MED ORDER — ONDANSETRON HCL 4 MG/2ML IJ SOLN: 4.0000 mg | Freq: Four times a day (QID) | INTRAMUSCULAR | Status: DC | PRN

## 2018-09-26 MED ORDER — ALLOPURINOL 100 MG PO TABS
100.0000 mg | ORAL_TABLET | Freq: Every day | ORAL | Status: DC
  Administered 2018-09-27: 100 mg via ORAL
  Filled 2018-09-26: qty 1

## 2018-09-26 MED ORDER — SODIUM CHLORIDE 0.9 % IV BOLUS
500.0000 mL | Freq: Once | INTRAVENOUS | Status: AC
  Administered 2018-09-26: 500 mL via INTRAVENOUS

## 2018-09-26 MED ORDER — SODIUM CHLORIDE 0.9 % IV SOLN
10.0000 mL/h | Freq: Once | INTRAVENOUS | Status: AC
  Administered 2018-09-26: 10 mL/h via INTRAVENOUS

## 2018-09-26 MED ORDER — SODIUM CHLORIDE 0.9 % IV SOLN
80.0000 mg | Freq: Once | INTRAVENOUS | Status: AC
  Administered 2018-09-26: 80 mg via INTRAVENOUS
  Filled 2018-09-26: qty 80

## 2018-09-26 MED ORDER — DEXTROSE-NACL 5-0.45 % IV SOLN
INTRAVENOUS | Status: DC
  Administered 2018-09-26 – 2018-09-27 (×2): via INTRAVENOUS

## 2018-09-26 MED ORDER — ONDANSETRON HCL 4 MG PO TABS: 4.0000 mg | ORAL_TABLET | Freq: Four times a day (QID) | ORAL | Status: DC | PRN

## 2018-09-26 MED ORDER — VANCOMYCIN HCL IN DEXTROSE 1-5 GM/200ML-% IV SOLN
1000.0000 mg | Freq: Once | INTRAVENOUS | Status: AC
  Administered 2018-09-26: 1000 mg via INTRAVENOUS
  Filled 2018-09-26: qty 200

## 2018-09-26 MED ORDER — ACETAMINOPHEN 325 MG PO TABS: 650.0000 mg | ORAL_TABLET | Freq: Four times a day (QID) | ORAL | Status: DC | PRN

## 2018-09-26 MED ORDER — NUTRITIONAL SUPPLEMENT PO LIQD: Freq: Two times a day (BID) | ORAL | Status: DC

## 2018-09-26 MED ORDER — ACETAMINOPHEN 650 MG RE SUPP: 650.0000 mg | Freq: Four times a day (QID) | RECTAL | Status: DC | PRN

## 2018-09-26 MED ORDER — SODIUM CHLORIDE 0.9 % IV SOLN
1.0000 g | INTRAVENOUS | Status: DC
  Administered 2018-09-27: 10:00:00 1 g via INTRAVENOUS
  Filled 2018-09-26 (×2): qty 1

## 2018-09-26 MED ORDER — ENSURE ENLIVE PO LIQD: 237.0000 mL | Freq: Two times a day (BID) | ORAL | Status: DC

## 2018-09-26 MED ORDER — SODIUM CHLORIDE 0.9 % IV SOLN
8.0000 mg/h | INTRAVENOUS | Status: DC
  Administered 2018-09-26 – 2018-09-27 (×3): 8 mg/h via INTRAVENOUS
  Filled 2018-09-26 (×3): qty 80

## 2018-09-26 MED ORDER — VANCOMYCIN HCL IN DEXTROSE 750-5 MG/150ML-% IV SOLN
750.0000 mg | INTRAVENOUS | Status: DC
  Filled 2018-09-26: qty 150

## 2018-09-26 MED ORDER — SENNOSIDES-DOCUSATE SODIUM 8.6-50 MG PO TABS: 1.0000 | ORAL_TABLET | Freq: Every evening | ORAL | Status: DC | PRN

## 2018-09-26 MED ORDER — MIRTAZAPINE 15 MG PO TABS: 15.0000 mg | ORAL_TABLET | Freq: Every day | ORAL | Status: DC

## 2018-09-26 MED ORDER — PRO-STAT SUGAR FREE PO LIQD: 30.0000 mL | Freq: Two times a day (BID) | ORAL | Status: DC

## 2018-09-26 MED ORDER — LATANOPROST 0.005 % OP SOLN
1.0000 [drp] | Freq: Every day | OPHTHALMIC | Status: DC
  Administered 2018-09-27: 22:00:00 1 [drp] via OPHTHALMIC
  Filled 2018-09-26: qty 2.5

## 2018-09-26 MED ORDER — GUAIFENESIN 100 MG/5ML PO SOLN
200.0000 mg | Freq: Three times a day (TID) | ORAL | Status: DC | PRN
  Filled 2018-09-26: qty 10

## 2018-09-26 MED ORDER — ZINC SULFATE 220 (50 ZN) MG PO CAPS
220.0000 mg | ORAL_CAPSULE | Freq: Every day | ORAL | Status: DC
  Administered 2018-09-27: 13:00:00 220 mg via ORAL
  Filled 2018-09-26: qty 1

## 2018-09-26 MED ORDER — LIDOCAINE 5 % EX PTCH
1.0000 | MEDICATED_PATCH | Freq: Every day | CUTANEOUS | Status: DC
  Filled 2018-09-26 (×3): qty 1

## 2018-09-26 MED ORDER — BISACODYL 5 MG PO TBEC: 5.0000 mg | DELAYED_RELEASE_TABLET | Freq: Every day | ORAL | Status: DC | PRN

## 2018-09-26 MED ORDER — FLUTICASONE PROPIONATE 50 MCG/ACT NA SUSP
2.0000 | Freq: Every day | NASAL | Status: DC
  Filled 2018-09-26: qty 16

## 2018-09-26 MED ORDER — SODIUM CHLORIDE 0.9 % IV SOLN
2.0000 g | Freq: Once | INTRAVENOUS | Status: AC
  Administered 2018-09-26: 2 g via INTRAVENOUS
  Filled 2018-09-26: qty 2

## 2018-09-26 MED ORDER — LORATADINE 10 MG PO TABS
10.0000 mg | ORAL_TABLET | Freq: Every day | ORAL | Status: DC
  Administered 2018-09-27: 13:00:00 10 mg via ORAL
  Filled 2018-09-26: qty 1

## 2018-09-26 MED ORDER — VITAMIN B-12 1000 MCG PO TABS
1000.0000 ug | ORAL_TABLET | Freq: Every day | ORAL | Status: DC
  Administered 2018-09-27: 13:00:00 1000 ug via ORAL
  Filled 2018-09-26: qty 1

## 2018-09-26 MED ORDER — ALBUTEROL SULFATE (2.5 MG/3ML) 0.083% IN NEBU: 2.5000 mg | INHALATION_SOLUTION | RESPIRATORY_TRACT | Status: DC | PRN

## 2018-09-26 MED ORDER — METRONIDAZOLE IN NACL 5-0.79 MG/ML-% IV SOLN
500.0000 mg | Freq: Three times a day (TID) | INTRAVENOUS | Status: DC
  Administered 2018-09-26 – 2018-09-27 (×4): 500 mg via INTRAVENOUS
  Filled 2018-09-26 (×6): qty 100

## 2018-09-26 MED ORDER — METHOCARBAMOL 500 MG PO TABS
500.0000 mg | ORAL_TABLET | Freq: Four times a day (QID) | ORAL | Status: DC | PRN
  Filled 2018-09-26: qty 1

## 2018-09-26 MED ORDER — VITAMIN C 500 MG PO TABS
250.0000 mg | ORAL_TABLET | Freq: Two times a day (BID) | ORAL | Status: DC
  Administered 2018-09-27: 13:00:00 250 mg via ORAL
  Filled 2018-09-26: qty 1

## 2018-09-26 MED ORDER — SODIUM CHLORIDE 0.9 % IV BOLUS
1000.0000 mL | Freq: Once | INTRAVENOUS | Status: AC
  Administered 2018-09-26: 1000 mL via INTRAVENOUS

## 2018-09-26 MED ORDER — HYDROCODONE-ACETAMINOPHEN 5-325 MG PO TABS
1.0000 | ORAL_TABLET | ORAL | Status: DC | PRN
  Administered 2018-09-27: 1 via ORAL
  Filled 2018-09-26: qty 1

## 2018-09-26 MED ORDER — ADULT MULTIVITAMIN W/MINERALS CH
1.0000 | ORAL_TABLET | Freq: Every day | ORAL | Status: DC
  Administered 2018-09-27: 13:00:00 1 via ORAL
  Filled 2018-09-26: qty 1

## 2018-09-26 MED ORDER — VITAMIN D 1000 UNITS PO TABS
4000.0000 [IU] | ORAL_TABLET | Freq: Every day | ORAL | Status: DC
  Administered 2018-09-27: 13:00:00 4000 [IU] via ORAL
  Filled 2018-09-26 (×2): qty 4

## 2018-09-26 NOTE — Progress Notes (Signed)
Advanced Care Plan.  Purpose of Encounter: Palliative care and hospice care Parties in Attendance: The patient, her sister and me. Patient's Decisional Capacity: No. Medical Story: [name]  is a 84 y.o. female with a known history of multiple medical problems including anemia, arthritis, CKD stage IV, dementia, gout, hypertension, impaired mobility, cataract and etc. the patient is being admitted to the hospital due to sepsis and severe anemia and GI bleeding.  I discussed with the patient's sister about the patient's current condition, very poor prognosis, palliative care and hospice care.  The sister wants treatment for now and agreed to have a family meeting with palliative care staff tomorrow. Goals of Care Determinations: Palliative care or hospice care. Plan:  Code Status: DNR. Time spent discussing advance care planning: 20 minutes.

## 2018-09-26 NOTE — ED Notes (Signed)
Blood administration rate increased to 999cc/hr. Pt tolerating infusion. No s/s of adverse reactions.

## 2018-09-26 NOTE — Progress Notes (Signed)
Pt unresponsive. Tried to contact POA, but no answer. Unable to complete admission profile at this time.

## 2018-09-26 NOTE — Progress Notes (Signed)
CODE SEPSIS - PHARMACY COMMUNICATION  **Broad Spectrum Antibiotics should be administered within 1 hour of Sepsis diagnosis**  Time Code Sepsis Called/Page Received: @ [phone]  Antibiotics Ordered: Cefepime                                       Vancomycin                                       Flagyl   Time of 1st antibiotic administration: @ 1031  Additional action taken by pharmacy: Spoke to nurse @ 1028 about time remaining for Abx administration.   If necessary, Name of Provider/Nurse Contacted: Shanda Bumps, RN  Gardner Candle, PharmD, BCPS Clinical Pharmacist 09/26/2018 10:49 AM

## 2018-09-26 NOTE — ED Notes (Signed)
This RN went down to blood bank to obtain emergency release blood. When this RN got to the blood bank, Maralyn Sago stated the pt has a hx of "cold and warm auto antibody". This RN called Dr. Lenard Lance who verbally stated to wait on blood until an antibody panel is completed. Informed Sarah in blood bank. Informed Shanda Bumps, RN and Durene Cal, Charity fundraiser.

## 2018-09-26 NOTE — Progress Notes (Signed)
Second unit of emergency blood complete. VSS.

## 2018-09-26 NOTE — Progress Notes (Signed)
Critical Lactic Acid of 2.3. MD notified @ 1705.

## 2018-09-26 NOTE — ED Provider Notes (Signed)
[facility] Emergency Department Provider Note  Time seen: 9:30 AM  I have reviewed the triage vital signs and the nursing notes.   HISTORY  Chief Complaint Altered Mental Status    HPI [name] is a 84 y.o. female with a past medical history of arthritis, CKD, dementia, hypertension, malnutrition, presents to the emergency department for altered mental status.  According to EMS per staff at the patient's nursing facility at baseline the patient has dementia but is talkative.  Today when I checked on the patient he found her to be very somnolent and largely unresponsive so the brought her to the emergency department.  Patient does have a DO NOT RESUSCITATE order.  Patient is unable to contribute to history or review of systems.   Past Medical History:  Diagnosis Date  . Age-related osteoporosis with current pathol fx with routine healing 01/18/2018  . Allergy   . Anemia   . Anxiety   . Arthritis   . At risk for falls   . Balance problems   . Benign hypertensive CKD 01/18/2018  . Bursitis of right hip   . Cataract   . Chronic kidney disease   . Decubitus ulcer of left heel, stage 3 (HCC)   . Deficiency of other specified B group vitamins   . 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 Depression   . Disease of thyroid gland   . Frail elderly   . Gout   . Hypertension   . Hypomagnesemia   . Hypopotassemia   . Impaired mobility   . Memory loss   . Muscle weakness (generalized)   . Neuromuscular disorder (HCC)   . Peripheral venous insufficiency   . Primary open angle glaucoma 09/11/2015  . Severe protein-calorie malnutrition (HCC)   . Vision problems   . Visual impairment     Patient Active Problem List   Diagnosis Date Noted  . Chronic right-sided low back pain 09/16/2018  . Protein-calorie malnutrition, severe 09/10/2018  . Pressure injury of skin 09/09/2018  . Anemia 09/08/2018  . Chronic kidney disease  (CKD), stage IV (severe) (HCC) 08/28/2018  . Constipation due to opioid therapy 08/28/2018  . Chronic seasonal allergic rhinitis 08/28/2018  . Chronic depression 08/28/2018  . Pressure ulcer caused by device 04/15/2018  . Protein-calorie malnutrition (HCC) 02/09/2018  . Hypomagnesemia 02/09/2018  . Vitamin B12 deficiency 02/09/2018  . Age-related osteoporosis with current pathological fracture with routine healing 01/18/2018  . 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 Tibia/fibula fracture, right, closed, with routine healing, subsequent encounter 01/13/2018  . Cataracts, bilateral 09/11/2015  . Chronic gout of right foot 09/11/2015  . Essential hypertension 09/11/2015  . Primary open angle glaucoma 09/11/2015  . Primary osteoarthritis 09/11/2015  . Recurrent falls 09/11/2015  . Tinnitus of right ear 09/11/2015  . Venous insufficiency of both lower extremities 09/11/2015  . Weight loss 09/11/2015    Past Surgical History:  Procedure Laterality Date  . PARTIAL HYSTERECTOMY      Prior to Admission medications   Medication Sig Start Date End Date Taking? Authorizing Provider  acetaminophen (TYLENOL) 500 MG tablet Take 1,000 mg by mouth 3 (three) times daily.     [provider]  allopurinol (ZYLOPRIM) 100 MG tablet Take 100 mg by mouth daily.  05/22/17   [provider]  Amino Acids-Protein Hydrolys (FEEDING SUPPLEMENT, PRO-STAT SUGAR FREE 64,) LIQD Take 30 mLs by mouth 2 (two) times daily between  meals.    [provider]  Cholecalciferol 4000 units CAPS Take 4,000 Units by mouth daily.    [provider]  cyanocobalamin 1000 MCG tablet Take 1,000 mcg by mouth daily.    [provider]  feeding supplement, ENSURE ENLIVE, (ENSURE ENLIVE) LIQD Place 237 mLs into feeding tube 2 (two) times daily between meals. 09/10/18   Alford Highland, MD  fluticasone (FLONASE) 50 MCG/ACT nasal spray Place 2 sprays into both nostrils  at bedtime. 08/13/18   [provider]  Infant Care Products [facility] EX) Apply liberal amount topically to area of skin irritation prn. OK to leave at bedside.    [provider]  latanoprost (XALATAN) 0.005 % ophthalmic solution Place 1 drop into both eyes at bedtime.    [provider]  Lidocaine (ASPERCREME LIDOCAINE) 4 % PTCH Apply 1 patch topically daily.    [provider]  loratadine (CLARITIN) 10 MG tablet Take 10 mg by mouth daily. 08/13/18   [provider]  losartan (COZAAR) 25 MG tablet Take 25 mg by mouth daily.    [provider]  magnesium hydroxide (MILK OF MAGNESIA) 400 MG/5ML suspension Take 30 mLs by mouth every 4 (four) hours as needed. Constipation/ no BM for 2 days    [provider]  methocarbamol (ROBAXIN) 500 MG tablet Take 500 mg by mouth every 6 (six) hours as needed for muscle spasms.    [provider]  metoprolol tartrate (LOPRESSOR) 50 MG tablet Take 1 tablet (50 mg total) by mouth 2 (two) times daily. 01/15/18   Katha Hamming, MD  mirtazapine (REMERON) 15 MG tablet Take 15 mg by mouth at bedtime.  05/22/17   [provider]  Multiple Vitamin (MULTIVITAMIN WITH MINERALS) TABS tablet Take 1 tablet by mouth daily. 09/10/18   Alford Highland, MD  NON FORMULARY Diet Type:  NAS    [provider]  Nutritional Supplements (NUTRITIONAL SUPPLEMENT PO) Take 1 each by mouth 2 (two) times daily. Magic Cup with lunch and dinner    [provider]  oxyCODONE (OXY IR/ROXICODONE) 5 MG immediate release tablet Take 1 tablet (5 mg total) by mouth every 4 (four) hours as needed. 09/10/18   Alford Highland, MD  polyethylene glycol (MIRALAX / GLYCOLAX) packet Take 17 g by mouth daily as needed. Mix with 4-8 ounces fluid-- for constipation     [provider]  Skin Protectants, Misc. (NO-STING SKIN-PREP EX) Apply topically. Apply liberal amount to the tips of the toes and  B-heels BID for skin protection    [provider]  vitamin C (VITAMIN C) 250 MG tablet Take 1 tablet (250 mg total) by mouth 2 (two) times daily. 09/10/18   Alford Highland, MD  zinc sulfate 220 (50 Zn) MG capsule Take 220 mg by mouth daily.    [provider]    No Known Allergies  Family History  Problem Relation Age of Onset  . Kidney disease Mother   . Kidney disease Father   . Heart attack Father   . Diabetes Brother     Social History Social History   Tobacco Use  . Smoking status: Former Smoker    Last attempt to quit: 09/21/2002    Years since quitting: 16.0  . Smokeless tobacco: Never Used  Substance Use Topics  . Alcohol use: No    Frequency: Never  . Drug use: No    Review of Systems Unable to complete review of systems secondary to altered mental  status.  ____________________________________________   PHYSICAL EXAM:  Constitutional: Patient is sleeping, will moan and move somewhat to sternal rub but is otherwise largely unresponsive. Eyes: Normal exam, Perrl around 2 mm. ENT   Head: Normocephalic and atraumatic.   Mouth/Throat: Mucous membranes are moist. Cardiovascular: Normal rate, regular rhythm.  Respiratory: Normal respiratory effort without tachypnea nor retractions. Breath sounds are clear  Gastrointestinal: Soft, no distention. Musculoskeletal: No significant edema.  Does appear to move extremities with sternal rub. Neurologic: Patient is not speaking, does not follow commands.  Remains sleeping throughout the examination. Skin:  Skin is warm, dry Psychiatric: Patient largely unresponsive besides minimal response to painful stimuli.  ____________________________________________    EKG  EKG reviewed and interpreted by myself shows a sinus tachycardia 109 bpm with a narrow QRS, normal axis, largely normal intervals, nonspecific ST changes.  ____________________________________________    RADIOLOGY  Chest x-ray  negative CT negative  ____________________________________________   INITIAL IMPRESSION / ASSESSMENT AND PLAN / ED COURSE  Pertinent labs & imaging results that were available during my care of the patient were reviewed by me and considered in my medical decision making (see chart for details).  Patient presents to the emergency department for altered mental status/decreased responsiveness since this morning.  Patient is somnolent, sleeping, minimal reaction to sternal rub will moan and move somewhat is otherwise unresponsive.  Does not respond to voice, does not follow commands.  GCS of 7, but DNR.  We will check labs, CT scan of the head, ABG and continue to closely monitor.  We will IV hydrate while awaiting results.  Patient found to be febrile 101.8.  Tachycardic to 108 with a respiratory rate of 22.  Sepsis orders have been started including broad-spectrum antibiotics.  Patient's labs are resulted with a hemoglobin of 4.2.  Rectal examination is strongly guaiac positive.  I have ordered 2 units of emergency release blood.  Patient receiving IV antibiotics to cover for possible sepsis of unknown source.  Chest x-ray and urine are negative.  White blood cell count is normal.  Patient has a history of requiring transfusions in the past likely due to chronic disease however given the patient's rectal bleeding and acutely decreased hemoglobin with decreased responsiveness we will transfuse with emergency release blood.  CRITICAL CARE Performed by: Minna Antis   Total critical care time: 45 minutes  Critical care time was exclusive of separately billable procedures and treating other patients.  Critical care was necessary to treat or prevent imminent or life-threatening deterioration.  Critical care was time spent personally by me on the following activities: development of treatment plan with patient and/or surrogate as well as nursing, discussions with consultants, evaluation of  patient's response to treatment, examination of patient, obtaining history from patient or surrogate, ordering and performing treatments and interventions, ordering and review of laboratory studies, ordering and review of radiographic studies, pulse oximetry and re-evaluation of patient's condition.   ____________________________________________   FINAL CLINICAL IMPRESSION(S) / ED DIAGNOSES  sepsis Altered mental status Unresponsive GI bleed   Minna Antis, MD 09/26/18 1144

## 2018-09-26 NOTE — Consult Note (Signed)
Pharmacy Antibiotic Note  [name] is a 84 y.o. female admitted on 09/26/2018 with sepsis.  Pharmacy has been consulted for Cefepime and Vancomycin dosing. Patient received vancomycin 1g IV and Cefepime 2g IV x 1 dose in ED. Patient is also ordered flagyl 500mg  IV every 8 hours.   Plan: Ke: 0.017   T1/2: 40.76   Vd: 36.26  Start Vancomycin 750 IV every 48 hours with 30 hour stack dosing.  Goal trough 15-20 mcg/mL. Calculated trough @ Css 18. Plan for trough level prior to 4th dose.  Start cefepime 1g IV every 24 hours.   Height: 5\' 4"  (162.6 cm) Weight: 114 lb 3.2 oz (51.8 kg) IBW/kg (Calculated) : 54.7  Temp (24hrs), Avg:100.8 F (38.2 C), Min:99.8 F (37.7 C), Max:101.8 F (38.8 C)  Recent Labs  Lab 09/21/18 0635 09/21/18 1215 09/26/18 0928  WBC  --  6.6 5.0  CREATININE 1.70*  --  2.08*  LATICACIDVEN  --   --  1.8    Estimated Creatinine Clearance: 15 mL/min (A) (by C-G formula based on SCr of 2.08 mg/dL (H)).    No Known Allergies  Antimicrobials this admission: 11/3 vancomycin  >>  11/3 Cefepime  >> 11/3 Flagyl>>   Dose adjustments this admission:   Microbiology results: 11/3 BCx: Pending  11/3 UCx: Pending   Thank you for allowing pharmacy to be a part of this patient's care.  13/3, PharmD, BCPS Clinical Pharmacist 09/26/2018 12:56 PM

## 2018-09-26 NOTE — ED Notes (Signed)
Consent to transfuse blood obtained from POA (sister in-law), Blair Heys.

## 2018-09-26 NOTE — ED Notes (Addendum)
2nd unit emergency blood started per MD order. Unit# [id number] 2. O-POS. Rate 250cc/hr.  Administration verified by [employer]

## 2018-09-26 NOTE — H&P (Signed)
[employer] -  at [facility]   PATIENT NAME: [name]    MR#:  [id number]  DATE OF BIRTH:  1926/09/01  DATE OF ADMISSION:  09/26/2018  PRIMARY CARE PHYSICIAN: Lauro Regulus, MD   REQUESTING/REFERRING PHYSICIAN: Dr. Lenard Lance  CHIEF COMPLAINT:   Chief Complaint  Patient presents with  . Altered Mental Status   Altered mental status. HISTORY OF PRESENT ILLNESS:  [name]  is a 84 y.o. female with a known history of multiple medical problems including anemia, arthritis, CKD stage IV, dementia, gout, hypertension, impaired mobility, cataract and etc. the patient is sent to ED from skilled nursing facility due to altered mental status.  Per ED physician, the patient has dementia but is talkative in nursing facility.  She was found confusion and altered mental status, sent to ED for further evaluation.  She is found septic and anemia with hemoglobin down to 4.2 in the ED.  ED physician started sepsis protocol and give antibiotics and start blood transfusion.  PAST MEDICAL HISTORY:   Past Medical History:  Diagnosis Date  . Age-related osteoporosis with current pathol fx with routine healing 01/18/2018  . Allergy   . Anemia   . Anxiety   . Arthritis   . At risk for falls   . Balance problems   . Benign hypertensive CKD 01/18/2018  . Bursitis of right hip   . Cataract   . Chronic kidney disease   . Decubitus ulcer of left heel, stage 3 (HCC)   . Deficiency of other specified B group vitamins   . 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 Depression   . Disease of thyroid gland   . Frail elderly   . Gout   . Hypertension   . Hypomagnesemia   . Hypopotassemia   . Impaired mobility   . Memory loss   . Muscle weakness (generalized)   . Neuromuscular disorder (HCC)   . Peripheral venous insufficiency   . Primary open angle glaucoma 09/11/2015  . Severe protein-calorie malnutrition (HCC)   . Vision  problems   . Visual impairment     PAST SURGICAL HISTORY:   Past Surgical History:  Procedure Laterality Date  . PARTIAL HYSTERECTOMY      SOCIAL HISTORY:   Social History   Tobacco Use  . Smoking status: Former Smoker    Last attempt to quit: 09/21/2002    Years since quitting: 16.0  . Smokeless tobacco: Never Used  Substance Use Topics  . Alcohol use: No    Frequency: Never    FAMILY HISTORY:   Family History  Problem Relation Age of Onset  . Kidney disease Mother   . Kidney disease Father   . Heart attack Father   . Diabetes Brother     DRUG ALLERGIES:  No Known Allergies  REVIEW OF SYSTEMS:   Review of Systems  Unable to perform ROS: Mental status change    MEDICATIONS AT HOME:   Prior to Admission medications   Medication Sig Start Date End Date Taking? Authorizing Provider  allopurinol (ZYLOPRIM) 100 MG tablet Take 100 mg by mouth daily.  05/22/17  Yes [provider]  cyanocobalamin 1000 MCG tablet Take 1,000 mcg by mouth daily.   Yes [provider]  feeding supplement, ENSURE ENLIVE, (ENSURE ENLIVE) LIQD Place 237 mLs into feeding tube 2 (two) times daily between meals. 09/10/18  Yes Alford Highland, MD  fluticasone (FLONASE) 50 MCG/ACT nasal spray Place 2  sprays into both nostrils at bedtime. 08/13/18  Yes [provider]  guaifenesin (ROBITUSSIN) 100 MG/5ML syrup Take 200 mg by mouth 3 (three) times daily as needed for cough.   Yes [provider]  latanoprost (XALATAN) 0.005 % ophthalmic solution Place 1 drop into both eyes at bedtime.   Yes [provider]  Lidocaine (ASPERCREME LIDOCAINE) 4 % PTCH Apply 1 patch topically daily.   Yes [provider]  loratadine (CLARITIN) 10 MG tablet Take 10 mg by mouth daily. 08/13/18  Yes [provider]  losartan (COZAAR) 25 MG tablet Take 25 mg by mouth daily.   Yes [provider]  magnesium hydroxide (MILK OF MAGNESIA) 400 MG/5ML  suspension Take 30 mLs by mouth every 4 (four) hours as needed. Constipation/ no BM for 2 days   Yes [provider]  methocarbamol (ROBAXIN) 500 MG tablet Take 500 mg by mouth every 6 (six) hours as needed for muscle spasms.   Yes [provider]  metoprolol tartrate (LOPRESSOR) 50 MG tablet Take 1 tablet (50 mg total) by mouth 2 (two) times daily. 01/15/18  Yes Katha Hamming, MD  vitamin C (VITAMIN C) 250 MG tablet Take 1 tablet (250 mg total) by mouth 2 (two) times daily. 09/10/18  Yes Wieting, Richard, MD  acetaminophen (TYLENOL) 500 MG tablet Take 1,000 mg by mouth 3 (three) times daily.     [provider]  Amino Acids-Protein Hydrolys (FEEDING SUPPLEMENT, PRO-STAT SUGAR FREE 64,) LIQD Take 30 mLs by mouth 2 (two) times daily between meals.    [provider]  Cholecalciferol 4000 units CAPS Take 4,000 Units by mouth daily.    [provider]  Infant Care Products [facility] EX) Apply liberal amount topically to area of skin irritation prn. OK to leave at bedside.    [provider]  mirtazapine (REMERON) 15 MG tablet Take 15 mg by mouth at bedtime.  05/22/17   [provider]  Multiple Vitamin (MULTIVITAMIN WITH MINERALS) TABS tablet Take 1 tablet by mouth daily. 09/10/18   Alford Highland, MD  NON FORMULARY Diet Type:  NAS    [provider]  Nutritional Supplements (NUTRITIONAL SUPPLEMENT PO) Take 1 each by mouth 2 (two) times daily. Magic Cup with lunch and dinner    [provider]  oxyCODONE (OXY IR/ROXICODONE) 5 MG immediate release tablet Take 1 tablet (5 mg total) by mouth every 4 (four) hours as needed. 09/10/18   Alford Highland, MD  polyethylene glycol (MIRALAX / GLYCOLAX) packet Take 17 g by mouth daily as needed. Mix with 4-8 ounces fluid-- for constipation     [provider]  Skin Protectants, Misc. (NO-STING SKIN-PREP EX) Apply topically. Apply liberal amount to the tips of the toes  and B-heels BID for skin protection    [provider]  zinc sulfate 220 (50 Zn) MG capsule Take 220 mg by mouth daily.    [provider]      VITAL SIGNS:  Blood pressure (!) 120/47, pulse (!) 122, temperature 99.8 F (37.7 C), temperature source Axillary, resp. rate (!) 26, height 5\' 4"  (1.626 m), weight 51.8 kg, SpO2 98 %.  PHYSICAL EXAMINATION:  Physical Exam  GENERAL:  84 y.o.-year-old patient lying in the bed with no acute distress.  EYES: Pupils equal, round, reactive to light and accommodation. No scleral icterus. Extraocular muscles intact.  HEENT: Head atraumatic, normocephalic. Oropharynx and nasopharynx clear.  NECK:  Supple, no jugular venous distention. No thyroid enlargement, no  tenderness.  LUNGS: Normal breath sounds bilaterally, no wheezing, rales,rhonchi or crepitation. No use of accessory muscles of respiration.  CARDIOVASCULAR: S1, S2 normal. No murmurs, rubs, or gallops.  ABDOMEN: Soft, nontender, nondistended. Bowel sounds present. No organomegaly or mass.  EXTREMITIES: No pedal edema, cyanosis, or clubbing.  NEUROLOGIC: Cranial nerves II through XII are intact. Muscle strength 5/5 in all extremities. Sensation intact. Gait not checked.  PSYCHIATRIC: The patient is alert and oriented x 3.  SKIN: No obvious rash, lesion, or ulcer.   LABORATORY PANEL:   CBC Recent Labs  Lab 09/26/18 0928  WBC 5.0  HGB 4.2*  HCT 14.3*  PLT 140*   ------------------------------------------------------------------------------------------------------------------  Chemistries  Recent Labs  Lab 09/26/18 0928  NA 147*  K 3.9  CL 123*  CO2 16*  GLUCOSE 139*  BUN 55*  CREATININE 2.08*  CALCIUM 9.5  AST 35  ALT 19  ALKPHOS 140*  BILITOT 1.4*   ------------------------------------------------------------------------------------------------------------------  Cardiac Enzymes Recent Labs  Lab 09/26/18 0928  TROPONINI 0.03*    ------------------------------------------------------------------------------------------------------------------  RADIOLOGY:  Ct Head Wo Contrast  Result Date: 09/26/2018 CLINICAL DATA:  Altered mental status. Found unresponsive today. Last known well at 8 p.m. last evening. EXAM: CT HEAD WITHOUT CONTRAST TECHNIQUE: Contiguous axial images were obtained from the base of the skull through the vertex without intravenous contrast. COMPARISON:  CT head without contrast 09/08/2018 FINDINGS: Brain: Mild generalized atrophy and white matter disease is present bilaterally. Acute cortical infarct, hemorrhage, or mass lesion is present. Ventricles are of normal size. No significant extra-axial fluid collection is present. Vascular: Atherosclerotic calcifications are present within the cavernous internal carotid arteries and at the dural margin of both vertebral arteries. Skull: Calvarium is intact. No focal lytic or blastic lesions are present. Sinuses/Orbits: The paranasal sinuses and mastoid air cells are clear. Globes and orbits are within normal limits. IMPRESSION: 1. No acute intracranial abnormality. 2. Mild generalized atrophy and white matter disease is within normal limits for age. 3. Atherosclerosis. Electronically Signed   By: Marin Roberts M.D.   On: 09/26/2018 10:45   Dg Chest Port 1 View  Result Date: 09/26/2018 CLINICAL DATA:  Altered mental status and sepsis. EXAM: PORTABLE CHEST 1 VIEW COMPARISON:  None. FINDINGS: Mildly enlarged cardiac silhouette. Calcific atherosclerotic disease of the aorta. There is no evidence of lobar airspace consolidation, pleural effusion or pneumothorax. Atelectasis versus scarring in the left lung base. Osseous structures are without acute abnormality. Soft tissues are grossly normal. IMPRESSION: Atelectasis versus scarring in the left lung base. Otherwise no acute pulmonary findings. Mildly enlarged cardiac silhouette. Electronically Signed   By: Ted Mcalpine M.D.   On: 09/26/2018 10:51      IMPRESSION AND PLAN:   Sepsis, unknown etiology. The patient will be admitted to medical floor. Continue vancomycin, cefepime and Flagyl, follow-up CBC and cultures.  Severe anemia, due to chronic disease and possible due to acute blood loss. PRBC transfusion 2 units for now, follow-up hemoglobin and hematology consult.  The patient was recently seen by Dr. Cathie Hoops and got blood transfusion.  GI bleeding.  Stool occult is positive.  N.p.o. with IV fluid support, continue Protonix IV, follow-up hemoglobin and GI consult.  Acute metabolic encephalopathy due to above. Aspiration the fall precaution.  Hypernatremia.  Possible due to sepsis and dehydration.  Start D5 half-normal saline IV and follow-up BMP.  Hypertension.  Hold losartan and Lopressor due to low side blood pressure.  CKD stage IV.  Stable. Severe malnutrition.  Continue nutrition  supplement.   All the records are reviewed and case discussed with ED provider. Management plans discussed with the patient's sister POA and they are in agreement.  CODE STATUS: DNR.  TOTAL TIME TAKING CARE OF THIS PATIENT: 38 minutes.    Shaune Pollack M.D on 09/26/2018 at 12:13 PM  Between 7am to 6pm - Pager - [phone]  After 6pm go to [email or web address] - Scientist, research (life sciences) [facility]  Office  [phone]  CC: Primary care physician; Lauro Regulus, MD   Note: This dictation was prepared with Dragon dictation along with smaller phrase technology. Any transcriptional errors that result from this process are unin

## 2018-09-26 NOTE — ED Triage Notes (Signed)
Pt arrives from [place] via [facility] with AMS. Pt last known well at 8pm last night. Nursing home staff found her unresponsive this morning. Physician bedside.

## 2018-09-26 NOTE — ED Notes (Signed)
Emergency blood started per MD order. Unit# [id number], O-POS. Consent obtained by POA. Administration verified with Tilman Neat RN.

## 2018-09-26 NOTE — ED Notes (Signed)
No s/s of reaction noted. Blood administration rate increased to 250cc/hr.

## 2018-09-26 NOTE — ED Notes (Signed)
Positive fecal occult 

## 2018-09-26 NOTE — Consult Note (Addendum)
Hematology/Oncology Consult note [facility] [phone] [phone]  Patient Care Team: Kirk Ruths, MD as PCP - General (Internal Medicine) Gerlene Fee, NP as Nurse Practitioner (Geriatric Medicine)   Name of the patient: [name]  [id number]  11/24/1926   Date of visit: 09/26/18 REASON FOR COSULTATION:  Anemia  History of presenting illness-  84 y.o. female with extensive past medical history including arthritis, CKD, dementia, hypertension, malnutrition, anemia, who presents to ER  For [place] to mental status.  Patient lives in nursing facility and at baseline she is able to communicate with others. She has been lethargic and somnolent unresponsive in the emergency room.  Also have a fever of 101.8.  Tachycardic 108 with a respiratory rate of 22. Patient was found to have acute on chronic anemia.  Hemoglobin dropped to 4.2, MCV 120.2, platelet count 140 White count 5 Chemistry showed worsening of her baseline creatinine to 2.08, hyperbilirubinemia with elevated indirect bilirubin. Lactic acid 1.8, Her liver function showed indirect hyper bilirubinemia.  Patient recently was seen by me in office on 1020 04/01/2018 for anemia work-up.  At that time, her smear showed mild left shift, platelets varying size, ovalocytes.  She also had a CBC done prior to coming to see me which showed teardrop cells.  She had outpatient work-up including normal B12, and folate level, normal TSH, elevated LDH at 483, multiple myeloma panel negative.   Review of Systems  Unable to perform ROS: Mental status change  Constitutional: Positive for fever.    No Known Allergies  Patient Active Problem List   Diagnosis Date Noted  . Sepsis ([place]) 09/26/2018  . Chronic right-sided low back pain 09/16/2018  . Protein-calorie malnutrition, severe 09/10/2018  . Pressure injury of skin 09/09/2018  . Anemia 09/08/2018  . Chronic kidney disease  (CKD), stage IV (severe) ([place]) 08/28/2018  . Constipation due to opioid therapy 08/28/2018  . Chronic seasonal allergic rhinitis 08/28/2018  . Chronic depression 08/28/2018  . Pressure ulcer caused by device 04/15/2018  . Protein-calorie malnutrition ([place]) 02/09/2018  . Hypomagnesemia 02/09/2018  . Vitamin B12 deficiency 02/09/2018  . Age-related osteoporosis with current pathological fracture with routine healing 01/18/2018  . Dementia arising in the senium and presenium ([place]) 01/18/2018  . Tibia/fibula fracture, right, closed, with routine healing, subsequent encounter 01/13/2018  . Cataracts, bilateral 09/11/2015  . Chronic gout of right foot 09/11/2015  . Essential hypertension 09/11/2015  . Primary open angle glaucoma 09/11/2015  . Primary osteoarthritis 09/11/2015  . Recurrent falls 09/11/2015  . Tinnitus of right ear 09/11/2015  . Venous insufficiency of both lower extremities 09/11/2015  . Weight loss 09/11/2015     Past Medical History:  Diagnosis Date  . Age-related osteoporosis with current pathol fx with routine healing 01/18/2018  . Allergy   . Anemia   . Anxiety   . Arthritis   . At risk for falls   . Balance problems   . Benign hypertensive CKD 01/18/2018  . Bursitis of right hip   . Cataract   . Chronic kidney disease   . Decubitus ulcer of left heel, stage 3 ([place])   . Deficiency of other specified B group vitamins   . Dementia arising in the senium and presenium ([place]) 01/18/2018  . Depression   . Disease of thyroid gland   . Frail elderly   . Gout   . Hypertension   . Hypomagnesemia   . Hypopotassemia   . Impaired mobility   .  Memory loss   . Muscle weakness (generalized)   . Neuromuscular disorder ([place])   . Peripheral venous insufficiency   . Primary open angle glaucoma 09/11/2015  . Severe protein-calorie malnutrition ([place])   . Vision problems   . Visual impairment      Past Surgical History:  Procedure Laterality Date  . PARTIAL  HYSTERECTOMY      Social History   Socioeconomic History  . Marital status: Widowed    Spouse name: Izell [place]  . Number of children: 0  . Years of education:    . Highest education level: Not on file  Occupational History  . Occupation: retired  Scientific laboratory technician  . Financial resource strain: Not very hard  . Food insecurity:    Worry: Never true    Inability: Never true  . Transportation needs:    Medical: Not on file    Non-medical: Not on file  Tobacco Use  . Smoking status: Former Smoker    Last attempt to quit: 09/21/2002    Years since quitting: 16.0  . Smokeless tobacco: Never Used  Substance and Sexual Activity  . Alcohol use: No    Frequency: Never  . Drug use: No  . Sexual activity: Not on file  Lifestyle  . Physical activity:    Days per week: Not on file    Minutes per session: Not on file  . Stress: Not on file  Relationships  . Social connections:    Talks on phone: Not on file    Gets together: Not on file    Attends religious service: Not on file    Active member of club or organization: Not on file    Attends meetings of clubs or organizations: Not on file    Relationship status: Not on file  . Intimate partner violence:    Fear of current or ex partner: Not on file    Emotionally abused: Not on file    Physically abused: Not on file    Forced sexual activity: Not on file  Other Topics Concern  . Not on file  Social History Narrative   Retired English as a second language teacher   Married   No children   Former smoker   No smokeless tobacco   No alcohol use   Full Code      Family History  Problem Relation Age of Onset  . Kidney disease Mother   . Kidney disease Father   . Heart attack Father   . Diabetes Brother      Current Facility-Administered Medications:  .  0.9 %  sodium chloride infusion, 10 mL/hr, Intravenous, Once, Harvest Dark, MD .  acetaminophen (TYLENOL) tablet 650 mg, 650 mg, Oral, Q6H PRN **OR** acetaminophen (TYLENOL) suppository 650 mg,  650 mg, Rectal, Q6H PRN, Demetrios Loll, MD .  albuterol (PROVENTIL) (2.5 MG/3ML) 0.083% nebulizer solution 2.5 mg, 2.5 mg, Nebulization, Q2H PRN, Demetrios Loll, MD .  Derrill Memo ON 09/27/2018] allopurinol (ZYLOPRIM) tablet 100 mg, 100 mg, Oral, Daily, Demetrios Loll, MD .  bisacodyl (DULCOLAX) EC tablet 5 mg, 5 mg, Oral, Daily PRN, Demetrios Loll, MD .  Derrill Memo ON 09/27/2018] ceFEPIme (MAXIPIME) 1 g in sodium chloride 0.9 % 100 mL IVPB, 1 g, Intravenous, Q24H, Hallaji, Sheema M, RPH .  [START ON 09/27/2018] cholecalciferol (VITAMIN D) tablet 4,000 Units, 4,000 Units, Oral, Daily, Demetrios Loll, MD .  dextrose 5 %-0.45 % sodium chloride infusion, , Intravenous, Continuous, Demetrios Loll, MD .  feeding supplement (ENSURE ENLIVE) (ENSURE ENLIVE) liquid 237 mL, 237  mL, Per Tube, BID BM, Demetrios Loll, MD .  feeding supplement (PRO-STAT SUGAR FREE 64) liquid 30 mL, 30 mL, Oral, BID BM, Demetrios Loll, MD .  fluticasone [facility]) 50 MCG/ACT nasal spray 2 spray, 2 spray, Each Nare, QHS, Demetrios Loll, MD .  guaiFENesin (ROBITUSSIN) 100 MG/5ML solution 200 mg, 200 mg, Oral, TID PRN, Demetrios Loll, MD .  HYDROcodone-acetaminophen (NORCO/VICODIN) 5-325 MG per tablet 1-2 tablet, 1-2 tablet, Oral, Q4H PRN, Demetrios Loll, MD .  latanoprost (XALATAN) 0.005 % ophthalmic solution 1 drop, 1 drop, Both Eyes, QHS, Demetrios Loll, MD .  Derrill Memo ON 09/27/2018] lidocaine (LIDODERM) 5 % 1 patch, 1 patch, Transdermal, Daily, Demetrios Loll, MD .  Derrill Memo ON 09/27/2018] loratadine (CLARITIN) tablet 10 mg, 10 mg, Oral, Daily, Demetrios Loll, MD .  methocarbamol (ROBAXIN) tablet 500 mg, 500 mg, Oral, Q6H PRN, Demetrios Loll, MD .  metroNIDAZOLE (FLAGYL) IVPB 500 mg, 500 mg, Intravenous, Q8H, Harvest Dark, MD, Stopped at 09/26/18 1121 .  mirtazapine (REMERON) tablet 15 mg, 15 mg, Oral, QHS, Demetrios Loll, MD .  Derrill Memo ON 09/27/2018] multivitamin with minerals tablet 1 tablet, 1 tablet, Oral, Daily, Demetrios Loll, MD .  ondansetron [facility]) tablet 4 mg, 4 mg, Oral, Q6H PRN **OR**  ondansetron (ZOFRAN) injection 4 mg, 4 mg, Intravenous, Q6H PRN, Demetrios Loll, MD .  pantoprazole (PROTONIX) 80 mg in sodium chloride 0.9 % 250 mL (0.32 mg/mL) infusion, 8 mg/hr, Intravenous, Continuous, Paduchowski, Lennette Bihari, MD, Last Rate: 25 mL/hr at 09/26/18 1239, 8 mg/hr at 09/26/18 1239 .  senna-docusate (Senokot-S) tablet 1 tablet, 1 tablet, Oral, QHS PRN, Demetrios Loll, MD .  Derrill Memo ON 09/27/2018] vancomycin (VANCOCIN) IVPB 750 mg/150 ml premix, 750 mg, Intravenous, Q48H, Hallaji, Sheema M, RPH .  [START ON 09/27/2018] vitamin B-12 (CYANOCOBALAMIN) tablet 1,000 mcg, 1,000 mcg, Oral, Daily, Demetrios Loll, MD .  vitamin C (ASCORBIC ACID) tablet 250 mg, 250 mg, Oral, BID, Demetrios Loll, MD .  Derrill Memo ON 09/27/2018] zinc sulfate capsule 220 mg, 220 mg, Oral, Daily, Demetrios Loll, MD   Physical exam:  Vitals:   09/26/18 1258 09/26/18 1311 09/26/18 1354 09/26/18 1432  BP:  (!) 125/52 (!) 157/68 (!) 155/66  Pulse:  (!) 117 (!) 120 (!) 114  Resp:  (!) 24    Temp: 99 F (37.2 C) 99 F (37.2 C) 99 F (37.2 C) 98.6 F (37 C)  TempSrc: Oral  Oral Oral  SpO2:  96% (!) 88% 99%  Weight:      Height:       Physical Exam  Constitutional:  Unresponsive elderly female lying the bed  HENT:  Head: Normocephalic.  Neck: Neck supple.  Cardiovascular:  Tachycardic  Pulmonary/Chest: Effort normal. No respiratory distress.  Abdominal: Soft. She exhibits no distension.  Musculoskeletal:  Bilateral lower extremity trace edema.  Neurological:  Unresponsive to verbal or physical stimuli  Skin: Skin is warm.        CMP Latest Ref Rng & Units 09/26/2018  Glucose 70 - 99 mg/dL 139(H)  BUN 8 - 23 mg/dL 55(H)  Creatinine 0.44 - 1.00 mg/dL 2.08(H)  Sodium 135 - 145 mmol/L 147(H)  Potassium 3.5 - 5.1 mmol/L 3.9  Chloride 98 - 111 mmol/L 123(H)  CO2 22 - 32 mmol/L 16(L)  Calcium 8.9 - 10.3 mg/dL 9.5  Total Protein 6.5 - 8.1 g/dL 6.9  Total Bilirubin 0.3 - 1.2 mg/dL 1.4(H)  Alkaline Phos 38 - 126 U/L 140(H)  AST  15 - 41 U/L 35  ALT 0 - 44 U/L  19   CBC Latest Ref Rng & Units 09/26/2018  WBC 4.0 - 10.5 K/uL 5.0  Hemoglobin 12.0 - 15.0 g/dL 4.2(LL)  Hematocrit 36.0 - 46.0 % 14.3(LL)  Platelets 150 - 400 K/uL 140(L)    RADIOGRAPHIC STUDIES: I have personally reviewed the radiological images as listed and agreed with the findings in the report. Ct Head Wo Contrast  Result Date: 09/26/2018 CLINICAL DATA:  Altered mental status. Found unresponsive today. Last known well at 8 p.m. last evening. EXAM: CT HEAD WITHOUT CONTRAST TECHNIQUE: Contiguous axial images were obtained from the base of the skull through the vertex without intravenous contrast. COMPARISON:  CT head without contrast 09/08/2018 FINDINGS: Brain: Mild generalized atrophy and white matter disease is present bilaterally. Acute cortical infarct, hemorrhage, or mass lesion is present. Ventricles are of normal size. No significant extra-axial fluid collection is present. Vascular: Atherosclerotic calcifications are present within the cavernous internal carotid arteries and at the dural margin of both vertebral arteries. Skull: Calvarium is intact. No focal lytic or blastic lesions are present. Sinuses/Orbits: The paranasal sinuses and mastoid air cells are clear. Globes and orbits are within normal limits. IMPRESSION: 1. No acute intracranial abnormality. 2. Mild generalized atrophy and white matter disease is within normal limits for age. 3. Atherosclerosis. Electronically Signed   By: San Morelle M.D.   On: 09/26/2018 10:45   Ct Head Wo Contrast  Result Date: 09/08/2018 CLINICAL DATA:  Altered level of consciousness. EXAM: CT HEAD WITHOUT CONTRAST TECHNIQUE: Contiguous axial images were obtained from the base of the skull through the vertex without intravenous contrast. COMPARISON:  CT scan of January 13, 2018. FINDINGS: Brain: No evidence of acute infarction, hemorrhage, hydrocephalus, extra-axial collection or mass lesion/mass effect.  Vascular: No hyperdense vessel or unexpected calcification. Skull: Normal. Negative for fracture or focal lesion. Sinuses/Orbits: No acute finding. Other: None. IMPRESSION: Normal head CT. Electronically Signed   By: Marijo Conception, M.D.   On: 09/08/2018 14:41   Dg Chest Port 1 View  Result Date: 09/26/2018 CLINICAL DATA:  Altered mental status and sepsis. EXAM: PORTABLE CHEST 1 VIEW COMPARISON:  None. FINDINGS: Mildly enlarged cardiac silhouette. Calcific atherosclerotic disease of the aorta. There is no evidence of lobar airspace consolidation, pleural effusion or pneumothorax. Atelectasis versus scarring in the left lung base. Osseous structures are without acute abnormality. Soft tissues are grossly normal. IMPRESSION: Atelectasis versus scarring in the left lung base. Otherwise no acute pulmonary findings. Mildly enlarged cardiac silhouette. Electronically Signed   By: Fidela Salisbury M.D.   On: 09/26/2018 10:51   Dg Hip Unilat With Pelvis 2-3 Views Right  Result Date: 09/09/2018 CLINICAL DATA:  RIGHT hip pain EXAM: DG HIP (WITH OR WITHOUT PELVIS) 2-3V RIGHT COMPARISON:  06/09/2017 FINDINGS: Diffuse osseous demineralization. Narrowing of the hip joints bilaterally. SI joints preserved. No acute fracture, dislocation, or bone destruction. Degenerative disc and facet disease changes at visualized lower lumbar spine. Scattered atherosclerotic calcifications. IMPRESSION: Osseous demineralization with mild degenerative changes of the hip joints and degenerative disc/facet disease changes of the lower lumbar spine. No acute abnormalities. Electronically Signed   By: Lavonia Dana M.D.   On: 09/09/2018 11:59    Assessment and plan- Patient is a 84 y.o. female with multiple comorbidities were sent to emergency room for evaluation of altered mental status, febrile. No family at bedside. #Acute on chronic anemia, labs reviewed hemoglobin dropped to 4.2, macrocytosis with MCV of 120, increased nucleated  RBC, increased indirect bilirubinemia Her peripheral smear was reviewed  by me and also discussed with hematology lab technician.  Patient has teardrop cells, schistocytes, spherocytes, large granular lymphocytes. Suspect MDS versus DIC versus MAHA. ,    We will check LDH, haptoglobin, reticulocyte panel, fibrinogen, PT/ PTT. Check precalcitonin.  Stool occult positive.? GI bleeding  Patient is currently on IV Protonix.  GI was consulted.  #Febrile illness, unknown etiology.  Lactic normal.  UA negative, chest x-ray atelectasis versus scarring of left lung base.  Otherwise no acute pulmonary findings. Source of infection unknown.  # Goal of care: Although symptoms not specific/differerential diagnosis is broad, cannot rule out MAHA, she has fever, acute on chronic renal impairment,  mental status change, likely hemolytic anemia.  If further testing pointing towards MAHA, standard care will be plasma exchange which is not available at [facility] and patient need to be transferred to [facility].  She will also need catheter placement which is an invasive procedure. I called patient's power of attorney/sister-in-law Ms. Ronie Spies at [phone].  I discussed with her that patient is critically ill and has poor prognosis.  I asked ask how aggressive family wants.  She told me that she would want patient to be comfortable, okay with supportive care for now, would not want patient to go through invasive procedures/transfer to another hospital , given patient's multiple comorbidities, advanced age.  Inez Catalina has previously discussed with Dr. Bridgett Larsson.  She will have a family meeting with palliative care/hospice staff tomorrow.    Thank you for allowing me to participate in the care of this patient. Above plan discussed with Dr.Chen Total face to face encounter time for this patient visit was 70 min. >50% of the time was  spent in counseling and coordination of care.    Earlie Server, MD, PhD Hematology Oncology [facility] at [facility] Pager- [phone] 09/26/2018

## 2018-09-27 ENCOUNTER — Other Ambulatory Visit: Payer: Self-pay

## 2018-09-27 DIAGNOSIS — R195 Other fecal abnormalities: Secondary | ICD-10-CM

## 2018-09-27 DIAGNOSIS — A419 Sepsis, unspecified organism: Principal | ICD-10-CM

## 2018-09-27 DIAGNOSIS — D696 Thrombocytopenia, unspecified: Secondary | ICD-10-CM

## 2018-09-27 DIAGNOSIS — Z515 Encounter for palliative care: Secondary | ICD-10-CM

## 2018-09-27 LAB — URINE CULTURE: CULTURE: NO GROWTH

## 2018-09-27 LAB — BASIC METABOLIC PANEL
ANION GAP: 5 (ref 5–15)
BUN: 52 mg/dL — AB (ref 8–23)
CHLORIDE: 128 mmol/L — AB (ref 98–111)
CO2: 16 mmol/L — ABNORMAL LOW (ref 22–32)
Calcium: 8.5 mg/dL — ABNORMAL LOW (ref 8.9–10.3)
Creatinine, Ser: 1.81 mg/dL — ABNORMAL HIGH (ref 0.44–1.00)
GFR, EST AFRICAN AMERICAN: 27 mL/min — AB (ref >60)
GFR, EST NON AFRICAN AMERICAN: 24 mL/min — AB (ref >60)
Glucose, Bld: 144 mg/dL — ABNORMAL HIGH (ref 70–99)
POTASSIUM: 3.7 mmol/L (ref 3.5–5.1)
SODIUM: 149 mmol/L — AB (ref 135–145)

## 2018-09-27 LAB — CBC
HEMATOCRIT: 19.5 % — AB (ref 36.0–46.0)
HEMOGLOBIN: 6.4 g/dL — AB (ref 12.0–15.0)
MCH: 35.4 pg — ABNORMAL HIGH (ref 26.0–34.0)
MCHC: 32.8 g/dL (ref 30.0–36.0)
MCV: 107.7 fL — ABNORMAL HIGH (ref 80.0–100.0)
NRBC: 0 % (ref 0.0–0.2)
Platelets: 142 10*3/uL — ABNORMAL LOW (ref 150–400)
RBC: 1.81 MIL/uL — AB (ref 3.87–5.11)
WBC: 7 10*3/uL (ref 4.0–10.5)

## 2018-09-27 LAB — PREPARE RBC (CROSSMATCH)

## 2018-09-27 LAB — MRSA PCR SCREENING: MRSA BY PCR: NEGATIVE

## 2018-09-27 LAB — PATHOLOGIST SMEAR REVIEW

## 2018-09-27 MED ORDER — HALOPERIDOL LACTATE 5 MG/ML IJ SOLN
1.0000 mg | Freq: Four times a day (QID) | INTRAMUSCULAR | Status: DC | PRN
  Administered 2018-09-28: 12:00:00 1 mg via INTRAVENOUS
  Filled 2018-09-27: qty 1

## 2018-09-27 MED ORDER — HYDROMORPHONE HCL 1 MG/ML IJ SOLN
0.5000 mg | INTRAMUSCULAR | Status: DC | PRN
  Administered 2018-09-27 – 2018-09-28 (×2): 0.5 mg via INTRAVENOUS
  Filled 2018-09-27 (×2): qty 1

## 2018-09-27 MED ORDER — SODIUM CHLORIDE 0.9% IV SOLUTION
Freq: Once | INTRAVENOUS | Status: AC
  Administered 2018-09-27: 10:00:00 via INTRAVENOUS

## 2018-09-27 MED ORDER — METHYLPREDNISOLONE SODIUM SUCC 40 MG IJ SOLR: 40.0000 mg | Freq: Every day | INTRAMUSCULAR | Status: DC

## 2018-09-27 NOTE — Progress Notes (Signed)
New hospice home referral received from [facility] following a Palliative Medicine consult. Patient is an 84 year old woman with a known history of anemia, arthritis, CKD stage IV, dementia, gout and HTN admitted to [facility] on 11/3 with sepsis and acute on chronic anemia with hemoglobin down to 4.2.  Patient has been evaluated by hematology and per chart note review MDS VS MAHA.  Palliative care was consulted for goals of care. Patient has a DSS guardian, but family remains involved. Patient has received 3 units of blood. Hemoglobin today was 6.4. Palliative NP Josh Borders met with patient's sister in law today and has spoken to DSS guardian Kalman Drape. Plan is for comfort focused care, with transfer to the hospice home. Patient seen lying in bed, eyes closed, brow furrowed. patient noted with cough, head of bed raised, legs elevated on pillows. Patient continued with furrowed brow and slight moaning. Respiratory rate 34./ Staff RN Candace present and will administer PRN pain medication as ordered. Patient information faxed to referral. Plan is to transfer patient to the hospice home tomorrow after DSS signs consents. CSW Annamaria Boots and hospital care team updated. Thank you for the opportunity to be involved in the care of this patient. Flo Shanks RN, BSN, [place] and [facility] [place], hospital Liaison [phone]

## 2018-09-27 NOTE — Consult Note (Signed)
Cephas Darby, MD [address]  [place]  [place], [place] [address]  Main: [phone]  Fax: [phone] Pager: [phone]   Consultation  Referring Provider:     No ref. provider found Primary Care Physician:  Kirk Ruths, MD Primary Gastroenterologist: None      Reason for Consultation:   FOBT positive, anemia  Date of Admission:  09/26/2018 Date of Consultation:  09/27/2018         HPI:   [name] is a 84 y.o. female with past medical history as detailed below presented with sepsis, AKI on CKD, altered mental status, febrile illness, severe macrocytic anemia.  Patient lives in a nursing facility and at baseline she is able to come medicate with others.  She was found to be unresponsive in the emergency room, fever about 1.8, heart rate 108, respiratory 22.  On admission, hemoglobin acutely dropped to 4.2, MCV 120, platelets 140, baseline hemoglobin 8.6 on 09/21/2018.  Last normal hemoglobin was 12.8 in 12/2017.  She has a macrocytosis since 2016, but not anemic untill 01/14/2018.  Her FOBT was positive.  Therefore GI is consulted for further evaluation.  Patient was evaluated by Dr. Tasia Catchings, hematology as outpatient.  Patient does not have iron deficiency, B12 or folate deficiency.  Patient is seen by hematology during this admission, peripheral blood smear was reviewed and differentials include MDS versus DIC versus MAHA  There is no evidence of active GI bleed, no witnessed episodes of melena, hematochezia, rectal bleeding or hematemesis Patient is a poor historian, history obtained from patient's nurse and chart review  No known history of NSAID use, or blood thinners  Past Medical History:  Diagnosis Date  . Age-related osteoporosis with current pathol fx with routine healing 01/18/2018  . Allergy   . Anemia   . Anxiety   . Arthritis   . At risk for falls   . Balance problems   . Benign hypertensive CKD 01/18/2018  . Bursitis of right hip   .  Cataract   . Chronic kidney disease   . Decubitus ulcer of left heel, stage 3 ([place])   . Deficiency of other specified B group vitamins   . Dementia arising in the senium and presenium ([place]) 01/18/2018  . Depression   . Disease of thyroid gland   . Frail elderly   . Gout   . Hypertension   . Hypomagnesemia   . Hypopotassemia   . Impaired mobility   . Memory loss   . Muscle weakness (generalized)   . Neuromuscular disorder ([place])   . Peripheral venous insufficiency   . Primary open angle glaucoma 09/11/2015  . Severe protein-calorie malnutrition ([place])   . Vision problems   . Visual impairment     Past Surgical History:  Procedure Laterality Date  . PARTIAL HYSTERECTOMY      Prior to Admission medications   Medication Sig Start Date End Date Taking? Authorizing Provider  acetaminophen (TYLENOL) 500 MG tablet Take 1,000 mg by mouth 3 (three) times daily.    Yes [provider]  allopurinol (ZYLOPRIM) 100 MG tablet Take 100 mg by mouth daily.  05/22/17  Yes [provider]  Amino Acids-Protein Hydrolys (FEEDING SUPPLEMENT, PRO-STAT SUGAR FREE 64,) LIQD Take 30 mLs by mouth 2 (two) times daily between meals.   Yes [provider]  Cholecalciferol 4000 units CAPS Take 4,000 Units by mouth daily.   Yes [provider]  cyanocobalamin 1000 MCG tablet Take  1,000 mcg by mouth daily.   Yes [provider]  feeding supplement, ENSURE ENLIVE, (ENSURE ENLIVE) LIQD Place 237 mLs into feeding tube 2 (two) times daily between meals. 09/10/18  Yes Wieting, Richard, MD  fluticasone (FLONASE) 50 MCG/ACT nasal spray Place 2 sprays into both nostrils at bedtime. 08/13/18  Yes [provider]  guaifenesin (ROBITUSSIN) 100 MG/5ML syrup Take 200 mg by mouth 3 (three) times daily as needed for cough.   Yes [provider]  latanoprost (XALATAN) 0.005 % ophthalmic solution Place 1 drop into both eyes at bedtime.   Yes [provider]    Lidocaine (ASPERCREME LIDOCAINE) 4 % PTCH Apply 1 patch topically daily.   Yes [provider]  loratadine (CLARITIN) 10 MG tablet Take 10 mg by mouth daily. 08/13/18  Yes [provider]  losartan (COZAAR) 25 MG tablet Take 25 mg by mouth daily.   Yes [provider]  magnesium hydroxide (MILK OF MAGNESIA) 400 MG/5ML suspension Take 30 mLs by mouth every 4 (four) hours as needed. Constipation/ no BM for 2 days   Yes [provider]  methocarbamol (ROBAXIN) 500 MG tablet Take 500 mg by mouth every 6 (six) hours as needed for muscle spasms.   Yes [provider]  metoprolol tartrate (LOPRESSOR) 50 MG tablet Take 1 tablet (50 mg total) by mouth 2 (two) times daily. 01/15/18  Yes Epifanio Lesches, MD  mirtazapine (REMERON) 15 MG tablet Take 15 mg by mouth at bedtime.  05/22/17  Yes [provider]  Multiple Vitamin (MULTIVITAMIN WITH MINERALS) TABS tablet Take 1 tablet by mouth daily. 09/10/18  Yes Wieting, Richard, MD  oxyCODONE (OXY IR/ROXICODONE) 5 MG immediate release tablet Take 1 tablet (5 mg total) by mouth every 4 (four) hours as needed. 09/10/18  Yes Wieting, Richard, MD  polyethylene glycol (MIRALAX / GLYCOLAX) packet Take 17 g by mouth daily as needed. Mix with 4-8 ounces fluid-- for constipation    Yes [provider]  vitamin C (VITAMIN C) 250 MG tablet Take 1 tablet (250 mg total) by mouth 2 (two) times daily. 09/10/18  Yes Wieting, Richard, MD  zinc sulfate 220 (50 Zn) MG capsule Take 220 mg by mouth daily.   Yes [provider]  [place] [facility] EX) Apply liberal amount topically to area of skin irritation prn. OK to leave at bedside.    [provider]  NON FORMULARY Diet Type:  NAS    [provider]  Nutritional Supplements (NUTRITIONAL SUPPLEMENT PO) Take 1 each by mouth 2 (two) times daily. Magic Cup with lunch and dinner    [provider]  Skin Protectants, Misc.  (NO-STING SKIN-PREP EX) Apply topically. Apply liberal amount to the tips of the toes and B-heels BID for skin protection    [provider]    Family History  Problem Relation Age of Onset  . Kidney disease Mother   . Kidney disease Father   . Heart attack Father   . Diabetes Brother      Social History   Tobacco Use  . Smoking status: Former Smoker    Last attempt to quit: 09/21/2002    Years since quitting: 16.0  . Smokeless tobacco: Never Used  Substance Use Topics  . Alcohol use: No    Frequency: Never  . Drug use: No    Allergies as of 09/26/2018  . (No Known Allergies)    Review of Systems:    All systems reviewed and  negative except where noted in HPI.   Physical Exam:  Vital signs in last 24 hours: Temp:  [98 F (36.7 C)-99.1 F (37.3 C)] 99.1 F (37.3 C) (11/04 1316) Pulse Rate:  [108-121] 119 (11/04 1316) Resp:  [18-20] 20 (11/04 1316) BP: (132-167)/(51-62) 167/61 (11/04 1316) SpO2:  [97 %-100 %] 100 % (11/04 1316) Last BM Date: (last BM unknown) General: Comfortable, lying in bed, nonverbal, NAD Head:  Normocephalic and atraumatic. Eyes:   No icterus.   Conjunctiva pink. PERRLA. Ears:  Normal auditory acuity. Neck:  Supple; no masses or thyroidomegaly Lungs: Respirations even and unlabored. Lungs clear to auscultation bilaterally.   No wheezes, crackles, or rhonchi.  Heart:  Regular rate and rhythm;  Without murmur, clicks, rubs or gallops Abdomen:  Soft, nondistended, nontender. Normal bowel sounds. No appreciable masses or hepatomegaly.  No rebound or guarding.  Rectal:  Not performed. Msk:  Symmetrical without gross deformities. Extremities:  Without edema, cyanosis or clubbing. Neurologic:  Alert and oriented x1 Skin:  Intact without significant lesions or rashes. Marland Kitchen  LAB RESULTS: CBC Latest Ref Rng & Units 09/27/2018 09/26/2018 09/26/2018  WBC 4.0 - 10.5 K/uL 7.0 - 5.0  Hemoglobin 12.0 - 15.0 g/dL 6.4(L) 7.9(L) 4.2(LL)  Hematocrit  36.0 - 46.0 % 19.5(L) - 14.3(LL)  Platelets 150 - 400 K/uL 142(L) - 140(L)    BMET BMP Latest Ref Rng & Units 09/27/2018 09/26/2018 09/21/2018  Glucose 70 - 99 mg/dL 144(H) 139(H) 94  BUN 8 - 23 mg/dL 52(H) 55(H) 43(H)  Creatinine 0.44 - 1.00 mg/dL 1.81(H) 2.08(H) 1.70(H)  Sodium 135 - 145 mmol/L 149(H) 147(H) 144  Potassium 3.5 - 5.1 mmol/L 3.7 3.9 3.6  Chloride 98 - 111 mmol/L 128(H) 123(H) 120(H)  CO2 22 - 32 mmol/L 16(L) 16(L) 19(L)  Calcium 8.9 - 10.3 mg/dL 8.5(L) 9.5 8.5(L)    LFT Hepatic Function Latest Ref Rng & Units 09/26/2018 09/24/2018 09/08/2018  Total Protein 6.5 - 8.1 g/dL 6.9 6.1(L) 6.8  Albumin 3.5 - 5.0 g/dL 3.7 3.2(L) 3.8  AST 15 - 41 U/L 35 41 22  ALT 0 - 44 U/L 19 14 15   Alk Phosphatase 38 - 126 U/L 140(H) 107 142(H)  Total Bilirubin 0.3 - 1.2 mg/dL 1.4(H) 2.0(H) 0.9  Bilirubin, Direct 0.0 - 0.2 mg/dL - 0.5(H) -     STUDIES: Ct Head Wo Contrast  Result Date: 09/26/2018 CLINICAL DATA:  Altered mental status. Found unresponsive today. Last known well at 8 p.m. last evening. EXAM: CT HEAD WITHOUT CONTRAST TECHNIQUE: Contiguous axial images were obtained from the base of the skull through the vertex without intravenous contrast. COMPARISON:  CT head without contrast 09/08/2018 FINDINGS: Brain: Mild generalized atrophy and white matter disease is present bilaterally. Acute cortical infarct, hemorrhage, or mass lesion is present. Ventricles are of normal size. No significant extra-axial fluid collection is present. Vascular: Atherosclerotic calcifications are present within the cavernous internal carotid arteries and at the dural margin of both vertebral arteries. Skull: Calvarium is intact. No focal lytic or blastic lesions are present. Sinuses/Orbits: The paranasal sinuses and mastoid air cells are clear. Globes and orbits are within normal limits. IMPRESSION: 1. No acute intracranial abnormality. 2. Mild generalized atrophy and white matter disease is within normal limits  for age. 3. Atherosclerosis. Electronically Signed   By: San Morelle M.D.   On: 09/26/2018 10:45   Dg Chest Port 1 View  Result Date: 09/26/2018 CLINICAL DATA:  Altered mental status and sepsis. EXAM: PORTABLE CHEST 1 VIEW COMPARISON:  None. FINDINGS: Mildly enlarged cardiac silhouette. Calcific atherosclerotic disease of the aorta. There is no evidence of lobar airspace consolidation, pleural effusion or pneumothorax. Atelectasis versus scarring in the left lung base. Osseous structures are without acute abnormality. Soft tissues are grossly normal. IMPRESSION: Atelectasis versus scarring in the left lung base. Otherwise no acute pulmonary findings. Mildly enlarged cardiac silhouette. Electronically Signed   By: Fidela Salisbury M.D.   On: 09/26/2018 10:51      Impression / Plan:   [name] is a 84 y.o. female with multiple comorbidities admitted with altered mental status, sepsis, AKI on CKD, acute on chronic macrocytic anemia with no evidence of active GI bleed.  This is very less likely a GI source of blood loss based on her presentation.  And, FOBT test does not carry any clinical utility in inpatient setting.  Performing endoscopic procedures is associated with more risks than benefits given the patient's clinical condition.  Do not recommend endoscopic evaluation unless the patient is actively bleeding.  Hematology is concerned about MDS or MAHA or DIC in the setting of infection.  Infectious source presumed to be pneumonia.  Palliative care is consulted, currently comfort care Thank you for involving me in the care of this patient.   GI will sign off    LOS: 1 day   Sherri Sear, MD  09/27/2018, 5:04 PM   Note: This dictation was prepared with Dragon dictation along with smaller phrase technology. Any transcriptional errors that result from this process are unintentional.

## 2018-09-27 NOTE — Consult Note (Signed)
[facility]  [phone] [phone]   Name: [name] Date: 09/27/2018 MRN: [id number]  DOB: May 28, 1926  Patient Care Team: Kirk Ruths, MD as PCP - General (Internal Medicine) Nyoka Cowden Phylis Bougie, NP as Nurse Practitioner (Geriatric Medicine)    REASON FOR CONSULTATION: Palliative Care consult requested for this 84 y.o. female with multiple medical problems including dementia, anemia, malnutrition, decubitus ulcers, and CKD stage IV, who was admitted on 09/26/2018 with sepsis and acute on chronic anemia with hemoglobin down to 4.2.  Patient has been evaluated by hematology.  Palliative care was consulted to help establish goals of care.   SOCIAL HISTORY:    Patient is widowed.  She has no children.  She has only one brother, who reportedly is of poor health and has limited involvement.  Patient sister-in-law is reportedly the person most involved and helps with decision-making.  Patient is a resident of the [facility].  ADVANCE DIRECTIVES:  Unknown  CODE STATUS: DNR  PAST MEDICAL HISTORY: Past Medical History:  Diagnosis Date  . Age-related osteoporosis with current pathol fx with routine healing 01/18/2018  . Allergy   . Anemia   . Anxiety   . Arthritis   . At risk for falls   . Balance problems   . Benign hypertensive CKD 01/18/2018  . Bursitis of right hip   . Cataract   . Chronic kidney disease   . Decubitus ulcer of left heel, stage 3 ([place])   . Deficiency of other specified B group vitamins   . Dementia arising in the senium and presenium ([place]) 01/18/2018  . Depression   . Disease of thyroid gland   . Frail elderly   . Gout   . Hypertension   . Hypomagnesemia   . Hypopotassemia   . Impaired mobility   . Memory loss   . Muscle weakness (generalized)   . Neuromuscular disorder ([place])   . Peripheral venous insufficiency   . Primary open angle glaucoma 09/11/2015  .  Severe protein-calorie malnutrition ([place])   . Vision problems   . Visual impairment     PAST SURGICAL HISTORY:  Past Surgical History:  Procedure Laterality Date  . PARTIAL HYSTERECTOMY      HEMATOLOGY/ONCOLOGY HISTORY:   No history exists.    ALLERGIES:  has No Known Allergies.  MEDICATIONS:  Current Facility-Administered Medications  Medication Dose Route Frequency Provider Last Rate Last Dose  . acetaminophen (TYLENOL) tablet 650 mg  650 mg Oral Q6H PRN Demetrios Loll, MD       Or  . acetaminophen (TYLENOL) suppository 650 mg  650 mg Rectal Q6H PRN Demetrios Loll, MD      . albuterol (PROVENTIL) (2.5 MG/3ML) 0.083% nebulizer solution 2.5 mg  2.5 mg Nebulization Q2H PRN Demetrios Loll, MD      . allopurinol (ZYLOPRIM) tablet 100 mg  100 mg Oral Daily Demetrios Loll, MD      . bisacodyl (DULCOLAX) EC tablet 5 mg  5 mg Oral Daily PRN Demetrios Loll, MD      . ceFEPIme (MAXIPIME) 1 g in sodium chloride 0.9 % 100 mL IVPB  1 g Intravenous Q24H Hallaji, Sheema M, RPH 200 mL/hr at 09/27/18 1019 1 g at 09/27/18 1019  . cholecalciferol (VITAMIN D) tablet 4,000 Units  4,000 Units Oral Daily Demetrios Loll, MD      . dextrose 5 %-0.45 % sodium chloride infusion   Intravenous Continuous Demetrios Loll, MD 75 mL/hr  at 09/27/18 0640    . feeding supplement (ENSURE ENLIVE) (ENSURE ENLIVE) liquid 237 mL  237 mL Per Tube BID BM Demetrios Loll, MD      . feeding supplement (PRO-STAT SUGAR FREE 64) liquid 30 mL  30 mL Oral BID BM Demetrios Loll, MD      . fluticasone [facility]) 50 MCG/ACT nasal spray 2 spray  2 spray Each Nare QHS Demetrios Loll, MD      . guaiFENesin (ROBITUSSIN) 100 MG/5ML solution 200 mg  200 mg Oral TID PRN Demetrios Loll, MD      . HYDROcodone-acetaminophen (NORCO/VICODIN) 5-325 MG per tablet 1-2 tablet  1-2 tablet Oral Q4H PRN Demetrios Loll, MD      . latanoprost (XALATAN) 0.005 % ophthalmic solution 1 drop  1 drop Both Eyes QHS Demetrios Loll, MD      . lidocaine (LIDODERM) 5 % 1 patch  1 patch Transdermal Daily Demetrios Loll,  MD      . loratadine (CLARITIN) tablet 10 mg  10 mg Oral Daily Demetrios Loll, MD      . methocarbamol (ROBAXIN) tablet 500 mg  500 mg Oral Q6H PRN Demetrios Loll, MD      . mirtazapine (REMERON) tablet 15 mg  15 mg Oral QHS Demetrios Loll, MD      . multivitamin with minerals tablet 1 tablet  1 tablet Oral Daily Demetrios Loll, MD      . ondansetron [facility]) tablet 4 mg  4 mg Oral Q6H PRN Demetrios Loll, MD       Or  . ondansetron [facility]) injection 4 mg  4 mg Intravenous Q6H PRN Demetrios Loll, MD      . pantoprazole (PROTONIX) 80 mg in sodium chloride 0.9 % 250 mL (0.32 mg/mL) infusion  8 mg/hr Intravenous Continuous Harvest Dark, MD 25 mL/hr at 09/27/18 0052 8 mg/hr at 09/27/18 0052  . senna-docusate (Senokot-S) tablet 1 tablet  1 tablet Oral QHS PRN Demetrios Loll, MD      . vancomycin (VANCOCIN) IVPB 750 mg/150 ml premix  750 mg Intravenous Q48H Hallaji, Sheema M, RPH      . vitamin B-12 (CYANOCOBALAMIN) tablet 1,000 mcg  1,000 mcg Oral Daily Demetrios Loll, MD      . vitamin C (ASCORBIC ACID) tablet 250 mg  250 mg Oral BID Demetrios Loll, MD      . zinc sulfate capsule 220 mg  220 mg Oral Daily Demetrios Loll, MD        VITAL SIGNS: BP (!) 157/58 (BP Location: Left Arm)   Pulse (!) 121   Temp 98.8 F (37.1 C) (Oral)   Resp 18   Ht 5' 4"  (1.626 m)   Wt 114 lb 3.2 oz (51.8 kg)   SpO2 99%   BMI 19.60 kg/m  Filed Weights   09/26/18 0928  Weight: 114 lb 3.2 oz (51.8 kg)    Estimated body mass index is 19.6 kg/m as calculated from the following:   Height as of this encounter: 5' 4"  (1.626 m).   Weight as of this encounter: 114 lb 3.2 oz (51.8 kg).  LABS: CBC:    Component Value Date/Time   WBC 7.0 09/27/2018 0522   HGB 6.4 (L) 09/27/2018 0522   HCT 19.5 (L) 09/27/2018 0522   PLT 142 (L) 09/27/2018 0522   MCV 107.7 (H) 09/27/2018 0522   NEUTROABS 2.2 09/26/2018 0928   LYMPHSABS 1.6 09/26/2018 0928   MONOABS 1.1 (H) 09/26/2018 0928   EOSABS 0.0 09/26/2018 9622  BASOSABS 0.0 09/26/2018 0928    Comprehensive Metabolic Panel:    Component Value Date/Time   NA 149 (H) 09/27/2018 0522   K 3.7 09/27/2018 0522   CL 128 (H) 09/27/2018 0522   CO2 16 (L) 09/27/2018 0522   BUN 52 (H) 09/27/2018 0522   CREATININE 1.81 (H) 09/27/2018 0522   GLUCOSE 144 (H) 09/27/2018 0522   CALCIUM 8.5 (L) 09/27/2018 0522   AST 35 09/26/2018 0928   ALT 19 09/26/2018 0928   ALKPHOS 140 (H) 09/26/2018 0928   BILITOT 1.4 (H) 09/26/2018 0928   PROT 6.9 09/26/2018 0928   ALBUMIN 3.7 09/26/2018 0928    RADIOGRAPHIC STUDIES: Ct Head Wo Contrast  Result Date: 09/26/2018 CLINICAL DATA:  Altered mental status. Found unresponsive today. Last known well at 8 p.m. last evening. EXAM: CT HEAD WITHOUT CONTRAST TECHNIQUE: Contiguous axial images were obtained from the base of the skull through the vertex without intravenous contrast. COMPARISON:  CT head without contrast 09/08/2018 FINDINGS: Brain: Mild generalized atrophy and white matter disease is present bilaterally. Acute cortical infarct, hemorrhage, or mass lesion is present. Ventricles are of normal size. No significant extra-axial fluid collection is present. Vascular: Atherosclerotic calcifications are present within the cavernous internal carotid arteries and at the dural margin of both vertebral arteries. Skull: Calvarium is intact. No focal lytic or blastic lesions are present. Sinuses/Orbits: The paranasal sinuses and mastoid air cells are clear. Globes and orbits are within normal limits. IMPRESSION: 1. No acute intracranial abnormality. 2. Mild generalized atrophy and white matter disease is within normal limits for age. 3. Atherosclerosis. Electronically Signed   By: San Morelle M.D.   On: 09/26/2018 10:45   Ct Head Wo Contrast  Result Date: 09/08/2018 CLINICAL DATA:  Altered level of consciousness. EXAM: CT HEAD WITHOUT CONTRAST TECHNIQUE: Contiguous axial images were obtained from the base of the skull through the vertex without intravenous  contrast. COMPARISON:  CT scan of January 13, 2018. FINDINGS: Brain: No evidence of acute infarction, hemorrhage, hydrocephalus, extra-axial collection or mass lesion/mass effect. Vascular: No hyperdense vessel or unexpected calcification. Skull: Normal. Negative for fracture or focal lesion. Sinuses/Orbits: No acute finding. Other: None. IMPRESSION: Normal head CT. Electronically Signed   By: Marijo Conception, M.D.   On: 09/08/2018 14:41   Dg Chest Port 1 View  Result Date: 09/26/2018 CLINICAL DATA:  Altered mental status and sepsis. EXAM: PORTABLE CHEST 1 VIEW COMPARISON:  None. FINDINGS: Mildly enlarged cardiac silhouette. Calcific atherosclerotic disease of the aorta. There is no evidence of lobar airspace consolidation, pleural effusion or pneumothorax. Atelectasis versus scarring in the left lung base. Osseous structures are without acute abnormality. Soft tissues are grossly normal. IMPRESSION: Atelectasis versus scarring in the left lung base. Otherwise no acute pulmonary findings. Mildly enlarged cardiac silhouette. Electronically Signed   By: Fidela Salisbury M.D.   On: 09/26/2018 10:51   Dg Hip Unilat With Pelvis 2-3 Views Right  Result Date: 09/09/2018 CLINICAL DATA:  RIGHT hip pain EXAM: DG HIP (WITH OR WITHOUT PELVIS) 2-3V RIGHT COMPARISON:  06/09/2017 FINDINGS: Diffuse osseous demineralization. Narrowing of the hip joints bilaterally. SI joints preserved. No acute fracture, dislocation, or bone destruction. Degenerative disc and facet disease changes at visualized lower lumbar spine. Scattered atherosclerotic calcifications. IMPRESSION: Osseous demineralization with mild degenerative changes of the hip joints and degenerative disc/facet disease changes of the lower lumbar spine. No acute abnormalities. Electronically Signed   By: Lavonia Dana M.D.   On: 09/09/2018 11:59    PERFORMANCE  STATUS (ECOG) : 4 - Bedbound  Review of Systems As noted above. Otherwise, a complete review of  systems is negative.  Physical Exam General: Ill-appearing, lying in bed Cardiovascular: regular rate and rhythm Pulmonary: clear ant fields Abdomen: soft, nontender, + bowel sounds GU: no suprapubic tenderness Extremities: no edema, no joint deformities Skin: Skin ulcer is noted but not visualized, patient has dressing on legs and heels Neurological: Wakes when stimulated, essentially nonverbal, does not follow commands  IMPRESSION: She currently receiving transfusion of packed red blood cells.  Hemoglobin down trended from 7.9 last night to 6.4 today.  Case discussed with Dr. Tasia Catchings.  There is clinical suspicion for MDS.  However family has opted not to pursue invasive procedures like bone marrow biopsy.    I met with patient sister-in-law.  I updated her on patient's current medical problems.  She says the patient has been declining for quite some time over the past months to years.  She is generally unhappy with the care the patient has received at the [facility].  She verbalizes a believe that patient's quality of life is poor.  We talked about options for continued work-up and treatment.  She does not feel that patient should have any other invasive procedures.  Additionally, she states that we should instead focus on keeping patient comfortable.  We discussed comfort care in detail including stopping transfusions with the expectation that patient would be at end-of-life.  We talked about the option of hospice involvement either at SNF or the Hospice Home.  Sister-in-law seems to be leaning toward comfort care and discharging to [facility].  However, she wants to speak with her children prior to making final decisions.  Sister-in-law confirms DNR.  She says patient would not want her life prolonged artificially.  PLAN: We will follow-up with family regarding goals DNR Continue supportive care  Time Total: 50 minutes  Visit consisted of counseling and education dealing with  the complex and emotionally intense issues of symptom management and palliative care in the setting of serious and potentially life-threatening illness.Greater than 50%  of this time was spent counseling and coordinating care related to the above assessment and plan.  Signed by: Altha Harm, PhD, DNP, NP-C, [facility] [phone] (Work Cell)

## 2018-09-27 NOTE — Progress Notes (Signed)
[employer] [facility] - [place] at [facility]   PATIENT NAME: [name]    MR#:  [id number]  DATE OF BIRTH:  1926-01-21  SUBJECTIVE:   Patient with dementia.  Family at bedside  REVIEW OF SYSTEMS:    Unable to obtain Tolerating Diet: npo      DRUG ALLERGIES:  No Known Allergies  VITALS:  Blood pressure (!) 157/58, pulse (!) 121, temperature 98.8 F (37.1 C), temperature source Oral, resp. rate 18, height 5\' 4"  (1.626 m), weight 51.8 kg, SpO2 99 %.  PHYSICAL EXAMINATION:  Constitutional: Appears thin and frail not in distress HENT: Normocephalic. Oropharynx is clear and moist.  Eyes: Conjunctivae and EOM are normal. PERRLA, no scleral icterus.  Neck: Normal ROM. Neck supple. No JVD. No tracheal deviation. CVS: RRR, S1/S2 +, no murmurs, no gallops, no carotid bruit.  Pulmonary: Effort and breath sounds normal, no stridor, rhonchi, wheezes, rales.  Abdominal: Soft. BS +,  no distension, tenderness, rebound or guarding.  Musculoskeletal: Normal range of motion. No edema and no tenderness.  Neuro: Alert.  Oriented to name CN 2-12 grossly intact. No focal deficits. Skin: Decubitus ulcer heel psychiatric: Dementia    LABORATORY PANEL:   CBC Recent Labs  Lab 09/27/18 0522  WBC 7.0  HGB 6.4*  HCT 19.5*  PLT 142*   ------------------------------------------------------------------------------------------------------------------  Chemistries  Recent Labs  Lab 09/26/18 0928 09/27/18 0522  NA 147* 149*  K 3.9 3.7  CL 123* 128*  CO2 16* 16*  GLUCOSE 139* 144*  BUN 55* 52*  CREATININE 2.08* 1.81*  CALCIUM 9.5 8.5*  AST 35  --   ALT 19  --   ALKPHOS 140*  --   BILITOT 1.4*  --    ------------------------------------------------------------------------------------------------------------------  Cardiac Enzymes Recent Labs  Lab 09/26/18 0928  TROPONINI 0.03*    ------------------------------------------------------------------------------------------------------------------  RADIOLOGY:  Ct Head Wo Contrast  Result Date: 09/26/2018 CLINICAL DATA:  Altered mental status. Found unresponsive today. Last known well at 8 p.m. last evening. EXAM: CT HEAD WITHOUT CONTRAST TECHNIQUE: Contiguous axial images were obtained from the base of the skull through the vertex without intravenous contrast. COMPARISON:  CT head without contrast 09/08/2018 FINDINGS: Brain: Mild generalized atrophy and white matter disease is present bilaterally. Acute cortical infarct, hemorrhage, or mass lesion is present. Ventricles are of normal size. No significant extra-axial fluid collection is present. Vascular: Atherosclerotic calcifications are present within the cavernous internal carotid arteries and at the dural margin of both vertebral arteries. Skull: Calvarium is intact. No focal lytic or blastic lesions are present. Sinuses/Orbits: The paranasal sinuses and mastoid air cells are clear. Globes and orbits are within normal limits. IMPRESSION: 1. No acute intracranial abnormality. 2. Mild generalized atrophy and white matter disease is within normal limits for age. 3. Atherosclerosis. Electronically Signed   By: 09/10/2018 M.D.   On: 09/26/2018 10:45   Dg Chest Port 1 View  Result Date: 09/26/2018 CLINICAL DATA:  Altered mental status and sepsis. EXAM: PORTABLE CHEST 1 VIEW COMPARISON:  None. FINDINGS: Mildly enlarged cardiac silhouette. Calcific atherosclerotic disease of the aorta. There is no evidence of lobar airspace consolidation, pleural effusion or pneumothorax. Atelectasis versus scarring in the left lung base. Osseous structures are without acute abnormality. Soft tissues are grossly normal. IMPRESSION: Atelectasis versus scarring in the left lung base. Otherwise no acute pulmonary findings. Mildly enlarged cardiac silhouette. Electronically Signed   By: 13/01/2018 M.D.   On: 09/26/2018 10:51     ASSESSMENT AND  PLAN:    84 year old female with chronic kidney disease stage IV, dementia, protein calorie malnutrition and anemia who presented to the ER due to mental status changes.   1.  Acute metabolic encephalopathy in the setting of sepsis/pneumonia on top of underlying dementia: Patient presents with fever and tachycardia.  Sepsis is due to to presumed pneumonia left lung base. Continue cefepime  DC Flagyl and vancomycin Vision is at her baseline 2.  Acute on chronic anemia: Patient evaluated by oncology. Suspected MDS versus MAHA Consult note reviewed Family does not want aggressive management at this time Patient is status post 2 unit PRBC and will receive 1 unit today Follow hemoglobin Oncology evaluation appreciated  3.  Hypernatremia from poor p.o. intake and protein calorie malnutrition Continue D5 and follow sodium level  4.  Protein calorie malnutrition: Continue feeding supplement  5.  Acute on chronic kidney disease stage IV: Creatinine has improved with IV fluids  6.  Decubitus ulcer stage III present on admission: Wound care consult  7.  Dementia: Patient is at baseline Continue Remeron  Patient with overall poor prognosis Palliative care consultation placed Discussed with palliative care this morning.   Management plans discussed with the patient's POA and she is in agreement.  CODE STATUS: DNR  TOTAL TIME TAKING CARE OF THIS PATIENT: 30 minutes.     POSSIBLE D/C 1-2 days, DEPENDING ON CLINICAL CONDITION.   [name] M.D on 09/27/2018 at 10:40 AM  Between 7am to 6pm - Pager - [phone] After 6pm go to [email or web address] - Social research officer, government  [facility]  Office  [phone]  CC: Primary care physician; Lauro Regulus, MD  Note: This dictation was prepared with Dragon dictation along with smaller phrase technology. Any transcriptional errors that result from this process  are unintentional.

## 2018-09-27 NOTE — Progress Notes (Addendum)
Hematology/Oncology Progress Note [facility] Telephone:([phone] [phone]  Patient Care Team: Kirk Ruths, MD as PCP - General (Internal Medicine) Gerlene Fee, NP as Nurse Practitioner (Geriatric Medicine)   Name of the patient: [name]  [id number]  08/12/1926  Date of visit: 09/27/18   INTERVAL HISTORY-  Patient status post PRBC transfusion. No family members at bedside. Continue to be confused, unresponsive. Hemoglobin improved to 7.9 after blood transfusion.    Review of systems- Review of Systems  Unable to perform ROS: Mental status change    No Known Allergies  Patient Active Problem List   Diagnosis Date Noted  . Palliative care encounter   . Sepsis ([place]) 09/26/2018  . Altered mental status   . Gastrointestinal hemorrhage   . Thrombocytopenia ([place])   . Chronic right-sided low back pain 09/16/2018  . Protein-calorie malnutrition, severe 09/10/2018  . Pressure injury of skin 09/09/2018  . Symptomatic anemia 09/08/2018  . Chronic kidney disease (CKD), stage IV (severe) ([place]) 08/28/2018  . Constipation due to opioid therapy 08/28/2018  . Chronic seasonal allergic rhinitis 08/28/2018  . Chronic depression 08/28/2018  . Pressure ulcer caused by device 04/15/2018  . Protein-calorie malnutrition ([place]) 02/09/2018  . Hypomagnesemia 02/09/2018  . Vitamin B12 deficiency 02/09/2018  . Age-related osteoporosis with current pathological fracture with routine healing 01/18/2018  . Dementia arising in the senium and presenium ([place]) 01/18/2018  . Tibia/fibula fracture, right, closed, with routine healing, subsequent encounter 01/13/2018  . Cataracts, bilateral 09/11/2015  . Chronic gout of right foot 09/11/2015  . Essential hypertension 09/11/2015  . Primary open angle glaucoma 09/11/2015  . Primary osteoarthritis 09/11/2015  . Recurrent falls 09/11/2015  . Tinnitus of right ear 09/11/2015  . Venous  insufficiency of both lower extremities 09/11/2015  . Weight loss 09/11/2015     Past Medical History:  Diagnosis Date  . Age-related osteoporosis with current pathol fx with routine healing 01/18/2018  . Allergy   . Anemia   . Anxiety   . Arthritis   . At risk for falls   . Balance problems   . Benign hypertensive CKD 01/18/2018  . Bursitis of right hip   . Cataract   . Chronic kidney disease   . Decubitus ulcer of left heel, stage 3 ([place])   . Deficiency of other specified B group vitamins   . Dementia arising in the senium and presenium ([place]) 01/18/2018  . Depression   . Disease of thyroid gland   . Frail elderly   . Gout   . Hypertension   . Hypomagnesemia   . Hypopotassemia   . Impaired mobility   . Memory loss   . Muscle weakness (generalized)   . Neuromuscular disorder ([place])   . Peripheral venous insufficiency   . Primary open angle glaucoma 09/11/2015  . Severe protein-calorie malnutrition ([place])   . Vision problems   . Visual impairment      Past Surgical History:  Procedure Laterality Date  . PARTIAL HYSTERECTOMY      Social History   Socioeconomic History  . Marital status: Widowed    Spouse name: Izell Baden  . Number of children: 0  . Years of education:    . Highest education level: Not on file  Occupational History  . Occupation: retired  Scientific laboratory technician  . Financial resource strain: Not very hard  . Food insecurity:    Worry: Never true    Inability: Never true  . Transportation needs:  Medical: Not on file    Non-medical: Not on file  Tobacco Use  . Smoking status: Former Smoker    Last attempt to quit: 09/21/2002    Years since quitting: 16.0  . Smokeless tobacco: Never Used  Substance and Sexual Activity  . Alcohol use: No    Frequency: Never  . Drug use: No  . Sexual activity: Not Currently  Lifestyle  . Physical activity:    Days per week: Not on file    Minutes per session: Not on file  . Stress: Not on file  Relationships    . Social connections:    Talks on phone: Not on file    Gets together: Not on file    Attends religious service: Not on file    Active member of club or organization: Not on file    Attends meetings of clubs or organizations: Not on file    Relationship status: Not on file  . Intimate partner violence:    Fear of current or ex partner: Not on file    Emotionally abused: Not on file    Physically abused: Not on file    Forced sexual activity: Not on file  Other Topics Concern  . Not on file  Social History Narrative   Retired English as a second language teacher   Married   No children   Former smoker   No smokeless tobacco   No alcohol use   Full Code      Family History  Problem Relation Age of Onset  . Kidney disease Mother   . Kidney disease Father   . Heart attack Father   . Diabetes Brother      Current Facility-Administered Medications:  .  acetaminophen (TYLENOL) tablet 650 mg, 650 mg, Oral, Q6H PRN **OR** acetaminophen (TYLENOL) suppository 650 mg, 650 mg, Rectal, Q6H PRN, Demetrios Loll, MD .  albuterol (PROVENTIL) (2.5 MG/3ML) 0.083% nebulizer solution 2.5 mg, 2.5 mg, Nebulization, Q2H PRN, Demetrios Loll, MD .  allopurinol (ZYLOPRIM) tablet 100 mg, 100 mg, Oral, Daily, Demetrios Loll, MD, 100 mg at 09/27/18 1315 .  bisacodyl (DULCOLAX) EC tablet 5 mg, 5 mg, Oral, Daily PRN, Demetrios Loll, MD .  ceFEPIme (MAXIPIME) 1 g in sodium chloride 0.9 % 100 mL IVPB, 1 g, Intravenous, Q24H, Hallaji, Sheema M, RPH, Last Rate: 200 mL/hr at 09/27/18 1019, 1 g at 09/27/18 1019 .  cholecalciferol (VITAMIN D) tablet 4,000 Units, 4,000 Units, Oral, Daily, Demetrios Loll, MD, 4,000 Units at 09/27/18 1316 .  dextrose 5 %-0.45 % sodium chloride infusion, , Intravenous, Continuous, Demetrios Loll, MD, Last Rate: 75 mL/hr at 09/27/18 0640 .  feeding supplement (ENSURE ENLIVE) (ENSURE ENLIVE) liquid 237 mL, 237 mL, Per Tube, BID BM, Demetrios Loll, MD .  feeding supplement (PRO-STAT SUGAR FREE 64) liquid 30 mL, 30 mL, Oral, BID BM, Demetrios Loll, MD .  fluticasone [facility]) 50 MCG/ACT nasal spray 2 spray, 2 spray, Each Nare, QHS, Demetrios Loll, MD .  guaiFENesin (ROBITUSSIN) 100 MG/5ML solution 200 mg, 200 mg, Oral, TID PRN, Demetrios Loll, MD .  HYDROcodone-acetaminophen (NORCO/VICODIN) 5-325 MG per tablet 1-2 tablet, 1-2 tablet, Oral, Q4H PRN, Demetrios Loll, MD, 1 tablet at 09/27/18 1317 .  latanoprost (XALATAN) 0.005 % ophthalmic solution 1 drop, 1 drop, Both Eyes, QHS, Demetrios Loll, MD .  lidocaine (LIDODERM) 5 % 1 patch, 1 patch, Transdermal, Daily, Demetrios Loll, MD .  loratadine (CLARITIN) tablet 10 mg, 10 mg, Oral, Daily, Demetrios Loll, MD, 10 mg at 09/27/18 1317 .  methocarbamol (ROBAXIN) tablet 500 mg, 500 mg, Oral, Q6H PRN, Demetrios Loll, MD .  mirtazapine (REMERON) tablet 15 mg, 15 mg, Oral, QHS, Demetrios Loll, MD .  multivitamin with minerals tablet 1 tablet, 1 tablet, Oral, Daily, Demetrios Loll, MD, 1 tablet at 09/27/18 1317 .  ondansetron (ZOFRAN) tablet 4 mg, 4 mg, Oral, Q6H PRN **OR** ondansetron (ZOFRAN) injection 4 mg, 4 mg, Intravenous, Q6H PRN, Demetrios Loll, MD .  pantoprazole (PROTONIX) 80 mg in sodium chloride 0.9 % 250 mL (0.32 mg/mL) infusion, 8 mg/hr, Intravenous, Continuous, Paduchowski, Lennette Bihari, MD, Last Rate: 25 mL/hr at 09/27/18 1302, 8 mg/hr at 09/27/18 1302 .  senna-docusate (Senokot-S) tablet 1 tablet, 1 tablet, Oral, QHS PRN, Demetrios Loll, MD .  vancomycin (VANCOCIN) IVPB 750 mg/150 ml premix, 750 mg, Intravenous, Q48H, Hallaji, Sheema M, RPH .  vitamin B-12 (CYANOCOBALAMIN) tablet 1,000 mcg, 1,000 mcg, Oral, Daily, Demetrios Loll, MD, 1,000 mcg at 09/27/18 1317 .  vitamin C (ASCORBIC ACID) tablet 250 mg, 250 mg, Oral, BID, Demetrios Loll, MD, 250 mg at 09/27/18 1317 .  zinc sulfate capsule 220 mg, 220 mg, Oral, Daily, Demetrios Loll, MD, 220 mg at 09/27/18 1316   Physical exam:  Vitals:   09/27/18 0810 09/27/18 0943 09/27/18 1030 09/27/18 1316  BP: (!) 147/62 (!) 145/58 (!) 157/58 (!) 167/61  Pulse: (!) 115 (!) 117 (!) 121 (!) 119  Resp:  20 20 18 20   Temp: 98.5 F (36.9 C) 99.1 F (37.3 C) 98.8 F (37.1 C) 99.1 F (37.3 C)  TempSrc: Oral Oral Oral Oral  SpO2: 98% 98% 99% 100%  Weight:      Height:        Physical Exam  Constitutional:  Unresponsive elderly female lying the bed  HENT:  Head: Normocephalic.  Neck: Neck supple.  Cardiovascular:  Tachycardic  Pulmonary/Chest: Effort normal. No respiratory distress.  Abdominal: Soft. She exhibits no distension.  Musculoskeletal:  Bilateral lower extremity trace edema.  Neurological:  Unresponsive to verbal or physical stimuli  Skin: Skin is warm.     CMP Latest Ref Rng & Units 09/27/2018  Glucose 70 - 99 mg/dL 144(H)  BUN 8 - 23 mg/dL 52(H)  Creatinine 0.44 - 1.00 mg/dL 1.81(H)  Sodium 135 - 145 mmol/L 149(H)  Potassium 3.5 - 5.1 mmol/L 3.7  Chloride 98 - 111 mmol/L 128(H)  CO2 22 - 32 mmol/L 16(L)  Calcium 8.9 - 10.3 mg/dL 8.5(L)  Total Protein 6.5 - 8.1 g/dL -  Total Bilirubin 0.3 - 1.2 mg/dL -  Alkaline Phos 38 - 126 U/L -  AST 15 - 41 U/L -  ALT 0 - 44 U/L -   CBC Latest Ref Rng & Units 09/27/2018  WBC 4.0 - 10.5 K/uL 7.0  Hemoglobin 12.0 - 15.0 g/dL 6.4(L)  Hematocrit 36.0 - 46.0 % 19.5(L)  Platelets 150 - 400 K/uL 142(L)   RADIOGRAPHIC STUDIES: I have personally reviewed the radiological images as listed and agreed with the findings in the report.  Ct Head Wo Contrast  Result Date: 09/26/2018 CLINICAL DATA:  Altered mental status. Found unresponsive today. Last known well at 8 p.m. last evening. EXAM: CT HEAD WITHOUT CONTRAST TECHNIQUE: Contiguous axial images were obtained from the base of the skull through the vertex without intravenous contrast. COMPARISON:  CT head without contrast 09/08/2018 FINDINGS: Brain: Mild generalized atrophy and white matter disease is present bilaterally. Acute cortical infarct, hemorrhage, or mass lesion is present. Ventricles are of normal size. No significant extra-axial fluid collection is present.  Vascular:  Atherosclerotic calcifications are present within the cavernous internal carotid arteries and at the dural margin of both vertebral arteries. Skull: Calvarium is intact. No focal lytic or blastic lesions are present. Sinuses/Orbits: The paranasal sinuses and mastoid air cells are clear. Globes and orbits are within normal limits. IMPRESSION: 1. No acute intracranial abnormality. 2. Mild generalized atrophy and white matter disease is within normal limits for age. 3. Atherosclerosis. Electronically Signed   By: San Morelle M.D.   On: 09/26/2018 10:45   Ct Head Wo Contrast  Result Date: 09/08/2018 CLINICAL DATA:  Altered level of consciousness. EXAM: CT HEAD WITHOUT CONTRAST TECHNIQUE: Contiguous axial images were obtained from the base of the skull through the vertex without intravenous contrast. COMPARISON:  CT scan of January 13, 2018. FINDINGS: Brain: No evidence of acute infarction, hemorrhage, hydrocephalus, extra-axial collection or mass lesion/mass effect. Vascular: No hyperdense vessel or unexpected calcification. Skull: Normal. Negative for fracture or focal lesion. Sinuses/Orbits: No acute finding. Other: None. IMPRESSION: Normal head CT. Electronically Signed   By: Marijo Conception, M.D.   On: 09/08/2018 14:41   Dg Chest Port 1 View  Result Date: 09/26/2018 CLINICAL DATA:  Altered mental status and sepsis. EXAM: PORTABLE CHEST 1 VIEW COMPARISON:  None. FINDINGS: Mildly enlarged cardiac silhouette. Calcific atherosclerotic disease of the aorta. There is no evidence of lobar airspace consolidation, pleural effusion or pneumothorax. Atelectasis versus scarring in the left lung base. Osseous structures are without acute abnormality. Soft tissues are grossly normal. IMPRESSION: Atelectasis versus scarring in the left lung base. Otherwise no acute pulmonary findings. Mildly enlarged cardiac silhouette. Electronically Signed   By: Fidela Salisbury M.D.   On: 09/26/2018 10:51   Dg Hip Unilat  With Pelvis 2-3 Views Right  Result Date: 09/09/2018 CLINICAL DATA:  RIGHT hip pain EXAM: DG HIP (WITH OR WITHOUT PELVIS) 2-3V RIGHT COMPARISON:  06/09/2017 FINDINGS: Diffuse osseous demineralization. Narrowing of the hip joints bilaterally. SI joints preserved. No acute fracture, dislocation, or bone destruction. Degenerative disc and facet disease changes at visualized lower lumbar spine. Scattered atherosclerotic calcifications. IMPRESSION: Osseous demineralization with mild degenerative changes of the hip joints and degenerative disc/facet disease changes of the lower lumbar spine. No acute abnormalities. Electronically Signed   By: Lavonia Dana M.D.   On: 09/09/2018 11:59    Assessment and plan-  Patient is a 84 y.o. female with multiple comorbidities were sent to emergency room for evaluation of altered mental status, febrile.  #Acute on chronic anemia, labs reviewed.  Status post PRBC transfusion.  Hemoglobin improved to 7.9 post infusion.  Now dropped to 6.4 again.   Suspect MDS versus hemolytic anemia versus MAHA Stool was also positive but no active rectal bleeding, not explaining dramatic decrease of hemoglobin. Normal fibrinogen level, PTT is slightly increased PT, less likely DIC. LDH increased, haptoglobin pending. I will start the patient on trial of IV steroids may if there is a component of hemolytic anemia.   I had a phone discussion with patient's power of attorney sister-in-law Inez Catalina yesterday and she wants to keep patient comfortable, continue supportive care.  She is going to have a family meeting with palliative care.  Given patient's multiple comorbidities and current condition, I think she is a good hospice candidate.  Hold invasive work-up including bone marrow biopsy at this point. I hold checking ADAMTS level as family is not interested in hospital transfer/plasma exchange/catheter placement.  Continue supportive care.   Thank you for allowing me to participate in the  care of this patient.   Earlie Server, MD, PhD Hematology Oncology [facility] at [facility] Pager- [phone] 09/27/2018

## 2018-09-27 NOTE — Consult Note (Signed)
WOC Nurse wound consult note Reason for Consult: Wounds present on last admission 2 weeks ago, both improved. Wound type: Pressure left heel, venous insufficiency, right posterior LE Pressure Injury POA: Yes Measurement: Left heel:  0.4cm round x 0.2cm with pink, dry base and no exudate Right posterior LE: 3.5cm x 0.4cm x 0.2cm with pink, moist wound bed and small amount of light yellow exudate. Wound bed:As described above Drainage (amount, consistency, odor) as described above Periwound: intact, dry Dressing procedure/placement/frequency: Will continue with silicone foam dressings to both sites, but cleanse daily prior to reapplying. Elevation is a cornerstone of this patient's care via the Prevalon boot. WOC nursing team will not follow, but will remain available to this patient, the nursing and medical teams.  Please re-consult if needed. Thanks, Ladona Mow, MSN, RN, GNP, [place]  Pager# [phone]

## 2018-09-27 NOTE — Clinical Social Work Note (Signed)
Clinical Social Work Assessment  Patient Details  Name: [name] MRN: [id number] Date of Birth: 11/27/1925  Date of referral:  09/27/18               Reason for consult:  Facility Placement                Permission sought to share information with:  Case Manager, Magazine features editor, Family Supports Permission granted to share information::  Yes, Verbal Permission Granted  Name::        Agency::     Relationship::     Contact Information:     Housing/Transportation Living arrangements for the past 2 months:  Skilled Building surveyor of Information:  Facility Patient Interpreter Needed:  None Criminal Activity/Legal Involvement Pertinent to Current Situation/Hospitalization:  No - Comment as needed Significant Relationships:  Siblings Lives with:  Facility Resident Do you feel safe going back to the place where you live?  Yes Need for family participation in patient care:  Yes (Comment)  Care giving concerns:  Patient is a long term resident at [facility] assessment / plan:  CSW consulted for facility placement. CSW attempted to meet with patient but she is non responsive at this time. CSW found through chart review that patient is from [facility]. CSW spoke with [name] at [facility] and she confirms that patient is long term care. CSW also noted in chart that Palliative is meeting with family today. CSW notified that patient has a guardian through [facility] CSW notified Palliative of guardian. Palliative NP spoke with guardian [name] [phone] and she has decided to send patient to hospice home. Per [name], they would like patient to go to [facility] hospice home. CSW notified Lindsay Braun, hospice liaison of referral. CSW will continue to follow for discharge planning.   Employment status:  Retired Database administrator PT Recommendations:  Not assessed at this time Information / Referral  to community resources:     Patient/Family's Response to care:  Patient is not alert. Guardian was reached regarding patient   Patient/Family's Understanding of and Emotional Response to Diagnosis, Current Treatment, and Prognosis:  Guardian is understanding of current diagnoses and treatment.   Emotional Assessment Appearance:  Appears stated age Attitude/Demeanor/Rapport:  Unresponsive Affect (typically observed):  Unable to Assess Orientation:  Oriented to Self Alcohol / Substance use:  Not Applicable Psych involvement (Current and /or in the community):  No (Comment)  Discharge Needs  Concerns to be addressed:  Discharge Planning Concerns Readmission within the last 30 days:  No Current discharge risk:  None Barriers to Discharge:  Continued Medical Work up   [employer], LCSWA 09/27/2018, 3:59 PM

## 2018-09-27 NOTE — Progress Notes (Signed)
Case discussed with Dr. Cathie Hoops and Dr. Juliene Pina.  Patient would benefit from transition to comfort care with consideration of hospice.  I was informed by social worker that patient is a ward of the state.  I called and spoke with patient's DSS representative, Rhesia, LCSW.  I updated her on patient's medical problems including limited options for work-up and treatment.  We discussed alternative of comfort measures and transfer to hospice.  We discussed comfort measures in detail including stopping transfusions, IV fluids, antibiotics, and other non-symptomatic medications.  She verbalized an understanding that this would be associated with patient's end-of-life.  I received a call back from the DSS supervisor consenting to comfort care and hospice.  This consent was confirmed via telephone with Steward Drone, care manager.  Will consult social work to help coordinate discharge.  Case discussed with hospice liaison.  Plan: Initiate comfort care Continue nonsymptomatic medications Will start hydromorphone given renal failure Hospice referral Social work referral Transfer to the hospice home when bed is available  Laurette Schimke, PhD, NP-C

## 2018-09-28 LAB — HAPTOGLOBIN

## 2018-09-28 MED ORDER — LORAZEPAM 2 MG/ML IJ SOLN: 0.5000 mg | INTRAMUSCULAR | Status: DC | PRN

## 2018-09-28 MED ORDER — MORPHINE SULFATE (CONCENTRATE) 10 MG /0.5 ML PO SOLN: 5.0000 mg | ORAL | 0 refills | Status: DC | PRN

## 2018-09-28 MED ORDER — HYDROMORPHONE HCL 1 MG/ML IJ SOLN: 0.5000 mg | INTRAMUSCULAR | 0 refills | Status: AC | PRN

## 2018-09-28 MED ORDER — LORAZEPAM 1 MG PO TABS: 1.0000 mg | ORAL_TABLET | ORAL | 0 refills | Status: AC | PRN

## 2018-09-28 NOTE — Discharge Summary (Signed)
[employer] - [place] [facility]   PATIENT NAME: [name]    MR#:  [id number]  DATE OF BIRTH:  Jun 07, 1926  DATE OF ADMISSION:  09/26/2018   ADMITTING PHYSICIAN: Shaune Pollack, MD  DATE OF DISCHARGE: 09/28/2018  PRIMARY CARE PHYSICIAN: Lauro Regulus, MD   ADMISSION DIAGNOSIS:  Symptomatic anemia [D64.9] Altered mental status, unspecified altered mental status type [R41.82] Gastrointestinal hemorrhage, unspecified gastrointestinal hemorrhage type [K92.2] Sepsis, due to unspecified organism, unspecified whether acute organ dysfunction present (HCC) [A41.9] DISCHARGE DIAGNOSIS:  Active Problems:   Symptomatic anemia   Sepsis (HCC)   Altered mental status   Gastrointestinal hemorrhage   Thrombocytopenia (HCC)   Palliative care encounter  SECONDARY DIAGNOSIS:   Past Medical History:  Diagnosis Date  . Age-related osteoporosis with current pathol fx with routine healing 01/18/2018  . Allergy   . Anemia   . Anxiety   . Arthritis   . At risk for falls   . Balance problems   . Benign hypertensive CKD 01/18/2018  . Bursitis of right hip   . Cataract   . Chronic kidney disease   . Decubitus ulcer of left heel, stage 3 (HCC)   . Deficiency of other specified B group vitamins   . 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 Depression   . Disease of thyroid gland   . Frail elderly   . Gout   . Hypertension   . Hypomagnesemia   . Hypopotassemia   . Impaired mobility   . Memory loss   . Muscle weakness (generalized)   . Neuromuscular disorder (HCC)   . Peripheral venous insufficiency   . Primary open angle glaucoma 09/11/2015  . Severe protein-calorie malnutrition (HCC)   . Vision problems   . Visual impairment    HOSPITAL COURSE:  84 year old female with chronic kidney disease stage IV, dementia, protein calorie malnutrition and anemia admitted for mental status changes.  1.  Acute metabolic encephalopathy in  the setting of sepsis/pneumonia with underlying dementia: 3.  Hypernatremia from poor p.o. intake and protein calorie malnutrition Continue D5 and follow sodium level  4.  Protein calorie malnutrition: Continue feeding supplement  5.  Acute on chronic kidney disease stage IV: Creatinine has improved with IV fluids  6.  Decubitus ulcer stage III present on admission: Wound care consult  7.  Dementia: Patient is at baseline Continue Remeron  DISCHARGE CONDITIONS:  stable CONSULTS OBTAINED:  Treatment Team:  Rickard Patience, MD DRUG ALLERGIES:  No Known Allergies DISCHARGE MEDICATIONS:   Allergies as of 09/28/2018   No Known Allergies     Medication List    STOP taking these medications   acetaminophen 500 MG tablet Commonly known as:  TYLENOL   allopurinol 100 MG tablet Commonly known as:  ZYLOPRIM   ascorbic acid 250 MG tablet Commonly known as:  VITAMIN C   ASPERCREME LIDOCAINE 4 % Ptch Generic drug:  Lidocaine   Cholecalciferol 4000 units Caps   cyanocobalamin 1000 MCG tablet   DERMACLOUD EX   feeding supplement (ENSURE ENLIVE) Liqd   feeding supplement (PRO-STAT SUGAR FREE 64) Liqd   fluticasone 50 MCG/ACT nasal spray Commonly known as:  FLONASE   guaifenesin 100 MG/5ML syrup Commonly known as:  ROBITUSSIN   latanoprost 0.005 % ophthalmic solution Commonly known as:  XALATAN   loratadine 10 MG tablet Commonly known as:  CLARITIN   losartan 25 MG tablet Commonly known as:  COZAAR   magnesium hydroxide  400 MG/5ML suspension Commonly known as:  MILK OF MAGNESIA   methocarbamol 500 MG tablet Commonly known as:  ROBAXIN   metoprolol tartrate 50 MG tablet Commonly known as:  LOPRESSOR   mirtazapine 15 MG tablet Commonly known as:  REMERON   multivitamin with minerals Tabs tablet   NO-STING SKIN-PREP EX   NON FORMULARY   NUTRITIONAL SUPPLEMENT PO   oxyCODONE 5 MG immediate release tablet Commonly known as:  Oxy IR/ROXICODONE     polyethylene glycol packet Commonly known as:  MIRALAX / GLYCOLAX   zinc sulfate 220 (50 Zn) MG capsule     TAKE these medications   HYDROmorphone 1 MG/ML injection Commonly known as:  DILAUDID Inject 0.5 mLs (0.5 mg total) into the vein every 2 (two) hours as needed for moderate pain or severe pain.   LORazepam 1 MG tablet Commonly known as:  ATIVAN Take 1 tablet (1 mg total) by mouth every 4 (four) hours as needed for anxiety.      DISCHARGE INSTRUCTIONS:   DIET:  Pleasure feeding DISCHARGE CONDITION:  Fair ACTIVITY:  Activity as tolerated OXYGEN:  Home Oxygen: No.  Oxygen Delivery: room air DISCHARGE LOCATION:  Hospice Home   If you experience worsening of your admission symptoms, develop shortness of breath, life threatening emergency, suicidal or homicidal thoughts you must seek medical attention immediately by calling 911 or calling your MD immediately  if symptoms less severe.  You Must read complete instructions/literature along with all the possible adverse reactions/side effects for all the Medicines you take and that have been prescribed to you. Take any new Medicines after you have completely understood and accpet all the possible adverse reactions/side effects.   Please note  You were cared for by a hospitalist during your hospital stay. If you have any questions about your discharge medications or the care you received while you were in the hospital after you are discharged, you can call the unit and asked to speak with the hospitalist on call if the hospitalist that took care of you is not available. Once you are discharged, your primary care physician will handle any further medical issues. Please note that NO REFILLS for any discharge medications will be authorized once you are discharged, as it is imperative that you return to your primary care physician (or establish a relationship with a primary care physician if you do not have one) for your aftercare needs  so that they can reassess your need for medications and monitor your lab values.    On the day of Discharge:  VITAL SIGNS:  Blood pressure (!) 157/70, pulse (!) 109, temperature 99.2 F (37.3 C), temperature source Oral, resp. rate (!) 30, height 5\' 4"  (1.626 m), weight 51.8 kg, SpO2 99 %. PHYSICAL EXAMINATION:  GENERAL:  84 y.o.-year-old patient lying in the bed with no acute distress.  EYES: Pupils equal, round, reactive to light and accommodation. No scleral icterus. Extraocular muscles intact.  HEENT: Head atraumatic, normocephalic. Oropharynx and nasopharynx clear.  NECK:  Supple, no jugular venous distention. No thyroid enlargement, no tenderness.  LUNGS: Normal breath sounds bilaterally, no wheezing, rales,rhonchi or crepitation. No use of accessory muscles of respiration.  CARDIOVASCULAR: S1, S2 normal. No murmurs, rubs, or gallops.  ABDOMEN: Soft, non-tender, non-distended. Bowel sounds present. No organomegaly or mass.  EXTREMITIES: No pedal edema, cyanosis, or clubbing.  NEUROLOGIC: Cranial nerves II through XII are intact. Muscle strength 5/5 in all extremities. Sensation intact. Gait not checked.  PSYCHIATRIC: The patient is alert  and oriented x 3.  SKIN: No obvious rash, lesion, or ulcer.  DATA REVIEW:   CBC Recent Labs  Lab 09/27/18 0522  WBC 7.0  HGB 6.4*  HCT 19.5*  PLT 142*    Chemistries  Recent Labs  Lab 09/26/18 0928 09/27/18 0522  NA 147* 149*  K 3.9 3.7  CL 123* 128*  CO2 16* 16*  GLUCOSE 139* 144*  BUN 55* 52*  CREATININE 2.08* 1.81*  CALCIUM 9.5 8.5*  AST 35  --   ALT 19  --   ALKPHOS 140*  --   BILITOT 1.4*  --      Follow-up Information    Lauro Regulus, MD. Schedule an appointment as soon as possible for a visit in 5 day(s).   Specialty:  Internal Medicine Contact information: [address] [facility] [place] [address] [phone]           Management plans discussed with the patient, nursing  and they are in agreement.  CODE STATUS: DNR , comfort care  TOTAL TIME TAKING CARE OF THIS PATIENT: 45 minutes.    Delfino Lovett M.D on 09/28/2018 at 9:08 AM  Between 7am to 6pm - Pager - [phone]  After 6pm go to [email or web address] - Scientist, research (life sciences) [facility]  Office  [phone]  CC: Primary care physician; Lauro Regulus, MD   Note: This dictation was prepared with Dragon dictation along with smaller phrase technology. Any transcriptional errors that result from this process are unintentional.

## 2018-09-28 NOTE — Progress Notes (Signed)
Palliative Medicine [facility]  [phone] [phone]   Name: [name] Date: 09/28/2018 MRN: [id number]  DOB: 1926/09/20  Patient Care Team: Lauro Regulus, MD as PCP - General (Internal Medicine) Chilton Si Chong Sicilian, NP as Nurse Practitioner (Geriatric Medicine)    REASON FOR CONSULTATION: Palliative Care consult requested for this 84 y.o. female with multiple medical problems including dementia, anemia, malnutrition, decubitus ulcers, and CKD stage IV, who was admitted on 09/26/2018 with sepsis and acute on chronic anemia with hemoglobin down to 4.2.  Patient has been evaluated by hematology.  Palliative care was consulted to help establish goals of care.  CODE STATUS: DNR  PAST MEDICAL HISTORY: Past Medical History:  Diagnosis Date  . Age-related osteoporosis with current pathol fx with routine healing 01/18/2018  . Allergy   . Anemia   . Anxiety   . Arthritis   . At risk for falls   . Balance problems   . Benign hypertensive CKD 01/18/2018  . Bursitis of right hip   . Cataract   . Chronic kidney disease   . Decubitus ulcer of left heel, stage 3 (HCC)   . Deficiency of other specified B group vitamins   . 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 Depression   . Disease of thyroid gland   . Frail elderly   . Gout   . Hypertension   . Hypomagnesemia   . Hypopotassemia   . Impaired mobility   . Memory loss   . Muscle weakness (generalized)   . Neuromuscular disorder (HCC)   . Peripheral venous insufficiency   . Primary open angle glaucoma 09/11/2015  . Severe protein-calorie malnutrition (HCC)   . Vision problems   . Visual impairment     PAST SURGICAL HISTORY:  Past Surgical History:  Procedure Laterality Date  . PARTIAL HYSTERECTOMY      HEMATOLOGY/ONCOLOGY HISTORY:   No history exists.    ALLERGIES:  has No Known Allergies.  MEDICATIONS:  Current  Facility-Administered Medications  Medication Dose Route Frequency Provider Last Rate Last Dose  . acetaminophen (TYLENOL) tablet 650 mg  650 mg Oral Q6H PRN Shaune Pollack, MD       Or  . acetaminophen (TYLENOL) suppository 650 mg  650 mg Rectal Q6H PRN Shaune Pollack, MD      . albuterol (PROVENTIL) (2.5 MG/3ML) 0.083% nebulizer solution 2.5 mg  2.5 mg Nebulization Q2H PRN Shaune Pollack, MD      . bisacodyl (DULCOLAX) EC tablet 5 mg  5 mg Oral Daily PRN Shaune Pollack, MD      . haloperidol lactate (HALDOL) injection 1 mg  1 mg Intravenous Q6H PRN Borders, Daryl Eastern, NP   1 mg at 09/28/18 1214  . HYDROcodone-acetaminophen (NORCO/VICODIN) 5-325 MG per tablet 1-2 tablet  1-2 tablet Oral Q4H PRN Shaune Pollack, MD   1 tablet at 09/27/18 1317  . HYDROmorphone (DILAUDID) injection 0.5 mg  0.5 mg Intravenous Q2H PRN Borders, Daryl Eastern, NP   0.5 mg at 09/28/18 2876  . latanoprost (XALATAN) 0.005 % ophthalmic solution 1 drop  1 drop Both Eyes QHS Shaune Pollack, MD   1 drop at 09/27/18 2145  . LORazepam (ATIVAN) injection 0.5-1 mg  0.5-1 mg Intravenous Q4H PRN Borders, Daryl Eastern, NP      . methocarbamol (ROBAXIN) tablet 500 mg  500 mg Oral Q6H PRN Shaune Pollack, MD      . mirtazapine (REMERON) tablet 15 mg  15 mg Oral QHS Shaune Pollack, MD      . ondansetron [facility]) tablet 4 mg  4 mg Oral Q6H PRN Shaune Pollack, MD       Or  . ondansetron [facility]) injection 4 mg  4 mg Intravenous Q6H PRN Shaune Pollack, MD      . senna-docusate (Senokot-S) tablet 1 tablet  1 tablet Oral QHS PRN Shaune Pollack, MD        VITAL SIGNS: BP (!) 157/70 (BP Location: Left Arm)   Pulse (!) 109   Temp 99.2 F (37.3 C) (Oral)   Resp (!) 30   Ht 5\' 4"  (1.626 m)   Wt 114 lb 3.2 oz (51.8 kg)   SpO2 99%   BMI 19.60 kg/m  Filed Weights   09/26/18 0928  Weight: 114 lb 3.2 oz (51.8 kg)    Estimated body mass index is 19.6 kg/m as calculated from the following:   Height as of this encounter: 5\' 4"  (1.626 m).   Weight as of this encounter: 114 lb 3.2 oz (51.8  kg).  LABS: CBC:    Component Value Date/Time   WBC 7.0 09/27/2018 0522   HGB 6.4 (L) 09/27/2018 0522   HCT 19.5 (L) 09/27/2018 0522   PLT 142 (L) 09/27/2018 0522   MCV 107.7 (H) 09/27/2018 0522   NEUTROABS 2.2 09/26/2018 0928   LYMPHSABS 1.6 09/26/2018 0928   MONOABS 1.1 (H) 09/26/2018 0928   EOSABS 0.0 09/26/2018 0928   BASOSABS 0.0 09/26/2018 0928   Comprehensive Metabolic Panel:    Component Value Date/Time   NA 149 (H) 09/27/2018 0522   K 3.7 09/27/2018 0522   CL 128 (H) 09/27/2018 0522   CO2 16 (L) 09/27/2018 0522   BUN 52 (H) 09/27/2018 0522   CREATININE 1.81 (H) 09/27/2018 0522   GLUCOSE 144 (H) 09/27/2018 0522   CALCIUM 8.5 (L) 09/27/2018 0522   AST 35 09/26/2018 0928   ALT 19 09/26/2018 0928   ALKPHOS 140 (H) 09/26/2018 0928   BILITOT 1.4 (H) 09/26/2018 0928   PROT 6.9 09/26/2018 0928   ALBUMIN 3.7 09/26/2018 0928    RADIOGRAPHIC STUDIES: Ct Head Wo Contrast  Result Date: 09/26/2018 CLINICAL DATA:  Altered mental status. Found unresponsive today. Last known well at 8 p.m. last evening. EXAM: CT HEAD WITHOUT CONTRAST TECHNIQUE: Contiguous axial images were obtained from the base of the skull through the vertex without intravenous contrast. COMPARISON:  CT head without contrast 09/08/2018 FINDINGS: Brain: Mild generalized atrophy and white matter disease is present bilaterally. Acute cortical infarct, hemorrhage, or mass lesion is present. Ventricles are of normal size. No significant extra-axial fluid collection is present. Vascular: Atherosclerotic calcifications are present within the cavernous internal carotid arteries and at the dural margin of both vertebral arteries. Skull: Calvarium is intact. No focal lytic or blastic lesions are present. Sinuses/Orbits: The paranasal sinuses and mastoid air cells are clear. Globes and orbits are within normal limits. IMPRESSION: 1. No acute intracranial abnormality. 2. Mild generalized atrophy and white matter disease is  within normal limits for age. 3. Atherosclerosis. Electronically Signed   By: 13/01/2018 M.D.   On: 09/26/2018 10:45   Ct Head Wo Contrast  Result Date: 09/08/2018 CLINICAL DATA:  Altered level of consciousness. EXAM: CT HEAD WITHOUT CONTRAST TECHNIQUE: Contiguous axial images were obtained from the base of the skull through the vertex without intravenous contrast. COMPARISON:  CT scan of January 13, 2018. FINDINGS: Brain: No evidence of acute infarction, hemorrhage, hydrocephalus, extra-axial collection  or mass lesion/mass effect. Vascular: No hyperdense vessel or unexpected calcification. Skull: Normal. Negative for fracture or focal lesion. Sinuses/Orbits: No acute finding. Other: None. IMPRESSION: Normal head CT. Electronically Signed   By: Lupita Raider, M.D.   On: 09/08/2018 14:41   Dg Chest Port 1 View  Result Date: 09/26/2018 CLINICAL DATA:  Altered mental status and sepsis. EXAM: PORTABLE CHEST 1 VIEW COMPARISON:  None. FINDINGS: Mildly enlarged cardiac silhouette. Calcific atherosclerotic disease of the aorta. There is no evidence of lobar airspace consolidation, pleural effusion or pneumothorax. Atelectasis versus scarring in the left lung base. Osseous structures are without acute abnormality. Soft tissues are grossly normal. IMPRESSION: Atelectasis versus scarring in the left lung base. Otherwise no acute pulmonary findings. Mildly enlarged cardiac silhouette. Electronically Signed   By: Ted Mcalpine M.D.   On: 09/26/2018 10:51   Dg Hip Unilat With Pelvis 2-3 Views Right  Result Date: 09/09/2018 CLINICAL DATA:  RIGHT hip pain EXAM: DG HIP (WITH OR WITHOUT PELVIS) 2-3V RIGHT COMPARISON:  06/09/2017 FINDINGS: Diffuse osseous demineralization. Narrowing of the hip joints bilaterally. SI joints preserved. No acute fracture, dislocation, or bone destruction. Degenerative disc and facet disease changes at visualized lower lumbar spine. Scattered atherosclerotic  calcifications. IMPRESSION: Osseous demineralization with mild degenerative changes of the hip joints and degenerative disc/facet disease changes of the lower lumbar spine. No acute abnormalities. Electronically Signed   By: Ulyses Southward M.D.   On: 09/09/2018 11:59    PERFORMANCE STATUS (ECOG) : 4 - Bedbound  Review of Systems As noted above. Otherwise, a complete review of systems is negative.  Physical Exam General: Thin, ill-appearing, lying in bed Cardiovascular: Cardiac Pulmonary: clear ant fields Abdomen: soft, nontender, + bowel sounds Extremities: no edema Skin: no rashes Neurological: Poorly responsive, tremoring and hands  IMPRESSION: Patient comfort care.  Plan for discharge today to the hospice home for end-of-life care.  Patient requiring several doses of hydromorphone for comfort.  She has some tremoring in her hands that was exacerbated with movement.  Will give dose of Haldol prior to discharge.  Case discussed with hospice liaison.  PLAN: Continue comfort care Plan for discharge today to the hospice home  Time Total: 15 minutes  Visit consisted of counseling and education dealing with the complex and emotionally intense issues of symptom management and palliative care in the setting of serious and potentially life-threatening illness.Greater than 50%  of this time was spent counseling and coordinating care related to the above assessment and plan.  Signed by: Laurette Schimke, PhD, DNP, NP-C, [facility] [phone] (Work Cell)

## 2018-09-28 NOTE — Progress Notes (Signed)
Nutrition Brief Note  Patient identified to be seen for Malnutrition Screening Tool (MST). Patient known to this RD from an admission approximately 2 weeks ago. Chart reviewed. Patient now transitioning to comfort care.   No further nutrition interventions warranted at this time. Please consult RD as needed.   Helane Rima, MS, RD, LDN Office: [phone] Pager: [phone] After Hours/Weekend Pager: [phone]

## 2018-09-28 NOTE — Progress Notes (Signed)
Follow up visit made to new hospice home referral. Patient seen lying in bed, with eyes closed, did not open to verbal stimuli. Patient did respond very softly when asked about pain, which she denied. Respiratory rate even and unlabored. Patient had received IV dilaudid prior to writer's visit. Plan remains for discharge to the [facility] home today. Consents received from DSS. Writer spoke with both Saintclair Halsted and patient's sister in law Blair Heys via telephone to notify of transport. Hospital care team updated. Report called to the hospice home EMS notified for transport. Discharge summary faxed to referral. Thank you. Dayna Barker RN, BSN, [facility] [place], hospital Liaison [phone]

## 2018-09-28 NOTE — Discharge Instructions (Signed)
Hospice Introduction Hospice is a service that is designed to provide people who are terminally ill and their families with medical, spiritual, and psychological support. Its aim is to improve your quality of life by keeping you as alert and comfortable as possible. Who will be my providers when I begin hospice care? Hospice teams often include:  A nurse.  A doctor. The hospice doctor will be available for your care, but you can bring your regular doctor or nurse practitioner.  Social workers.  Religious leaders (such as a Clinical biochemist).  Trained volunteers.  What roles will providers play in my care? Hospice is performed by a team of health care professionals and volunteers who:  Help keep you comfortable: ? Hospice can be provided in your home or in a homelike setting. ? The hospice staff works with your family and friends to help meet your needs. ? You will enjoy the support of loved ones by receiving much of your basic care from family and friends.  Provide pain relief and manage your symptoms. The staff supply all necessary medicines and equipment.  Provide companionship when you are alone.  Allow you and your family to rest. They may do light housekeeping, prepare meals, and run errands.  Provide counseling. They will make sure your emotional, spiritual, and social needs and those of your family are being met.  Provide spiritual care: ? Spiritual care will be individualized to meet your needs and your family's needs. ? Spiritual care may involve:  Helping you look at what death means to you.  Helping you say goodbye to your family and friends.  Performing a specific religious ceremony or ritual.  When should hospice care begin? Most people who use hospice are believed to have fewer than 6 months to live.  Your family and health care providers can help you decide when hospice services should begin.  If your condition improves, you may discontinue the program.  What  should I consider before selecting a program? Most hospice programs are run by nonprofit, independent organizations. Some are affiliated with hospitals, nursing homes, or home health care agencies. Hospice programs can take place in the home or at a hospice center, hospital, or skilled nursing facility. When choosing a hospice program, ask the following questions:  What services are available to me?  What services will be offered to my loved ones?  How involved will my loved ones be?  How involved will my health care provider be?  Who makes up the hospice care team? How are they trained or screened?  How will my pain and symptoms be managed?  If my circumstances change, can the services be provided in a different setting, such as my home or in the hospital?  Is the program reviewed and licensed by the state or certified in some other way?  Where can I learn more about hospice? You can learn about existing hospice programs in your area from your health care providers. You can also read more about hospice online. The websites of the following organizations contain helpful information:  The [facility] and Palliative Care Organization [facility]).  The Hospice Association of America ([place]).  The [place].  The American Cancer Society (ACS).  Hospice Net.  This information is not intended to replace advice given to you by your health care provider. Make sure you discuss any questions you have with your health care provider. Document Released: 02/27/2004 Document Revised: 06/26/2016 Document Reviewed: 09/20/2013 Elsevier Interactive Patient Education  2017 [employer].

## 2018-09-30 ENCOUNTER — Inpatient Hospital Stay: Payer: Medicare Other | Admitting: Oncology

## 2018-09-30 ENCOUNTER — Ambulatory Visit: Payer: Medicare Other | Admitting: Physician Assistant

## 2018-09-30 LAB — BPAM RBC
BLOOD PRODUCT EXPIRATION DATE: 201911092359
Blood Product Expiration Date: 201911172359
Blood Product Expiration Date: 201911212359
Blood Product Expiration Date: 201911252359
Blood Product Expiration Date: 201911282359
ISSUE DATE / TIME: 201911031150
ISSUE DATE / TIME: 201911031150
ISSUE DATE / TIME: 201911041003
UNIT TYPE AND RH: 1700
UNIT TYPE AND RH: 5100
UNIT TYPE AND RH: 9500
Unit Type and Rh: 5100
Unit Type and Rh: 9500

## 2018-09-30 LAB — TYPE AND SCREEN
ABO/RH(D): B POS
Antibody Screen: NEGATIVE
UNIT DIVISION: 0
UNIT DIVISION: 0
UNIT DIVISION: 0
Unit division: 0
Unit division: 0

## 2018-09-30 LAB — BLOOD CULTURE ID PANEL (REFLEXED)
ACINETOBACTER BAUMANNII: NOT DETECTED
CANDIDA TROPICALIS: NOT DETECTED
Candida albicans: NOT DETECTED
Candida glabrata: NOT DETECTED
Candida krusei: NOT DETECTED
Candida parapsilosis: NOT DETECTED
ENTEROBACTERIACEAE SPECIES: NOT DETECTED
ENTEROCOCCUS SPECIES: NOT DETECTED
Enterobacter cloacae complex: NOT DETECTED
Escherichia coli: NOT DETECTED
HAEMOPHILUS INFLUENZAE: NOT DETECTED
KLEBSIELLA PNEUMONIAE: NOT DETECTED
Klebsiella oxytoca: NOT DETECTED
LISTERIA MONOCYTOGENES: NOT DETECTED
NEISSERIA MENINGITIDIS: NOT DETECTED
PSEUDOMONAS AERUGINOSA: NOT DETECTED
Proteus species: NOT DETECTED
STREPTOCOCCUS AGALACTIAE: NOT DETECTED
STREPTOCOCCUS PNEUMONIAE: NOT DETECTED
STREPTOCOCCUS SPECIES: NOT DETECTED
Serratia marcescens: NOT DETECTED
Staphylococcus aureus (BCID): NOT DETECTED
Staphylococcus species: NOT DETECTED
Streptococcus pyogenes: NOT DETECTED

## 2018-10-01 ENCOUNTER — Ambulatory Visit: Payer: Medicare Other

## 2018-10-01 LAB — CULTURE, BLOOD (ROUTINE X 2): Culture: NO GROWTH

## 2018-10-06 ENCOUNTER — Ambulatory Visit: Payer: Medicare Other

## 2018-10-06 ENCOUNTER — Ambulatory Visit: Payer: Medicare Other | Admitting: Oncology

## 2018-10-24 DEATH — deceased

## 2019-08-16 IMAGING — CT CT HEAD W/O CM
3 series · 14 of 44 positions shown, 16 images · non-contrast
Comparison: CT head without contrast 09/08/2018

CLINICAL DATA: Altered mental status. Found unresponsive today.
Last known well at 8 p.m. last evening.

EXAM:
CT HEAD WITHOUT CONTRAST
TECHNIQUE: Contiguous axial images were obtained from the base of the skull
through the vertex without intravenous contrast.

[Series 2: head wo · axial · 0.47mm/px · z∈[-161,-51]mm · 8 of 27 slices shown, 10 images]
[im 3/27  brain]
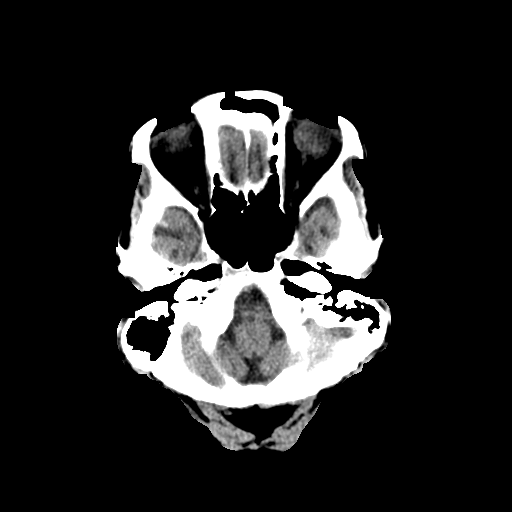
[im 3/27  bone]
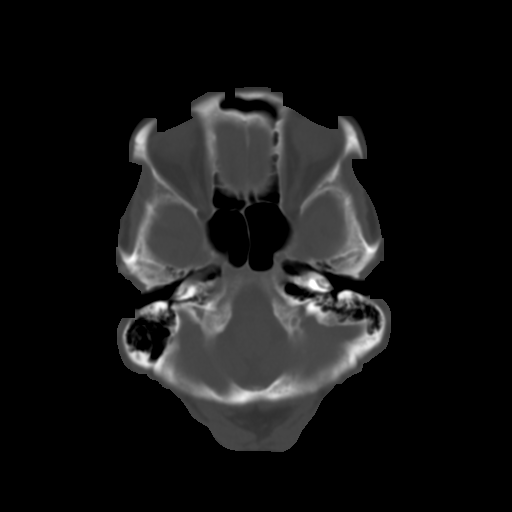
[im 6/27  brain]
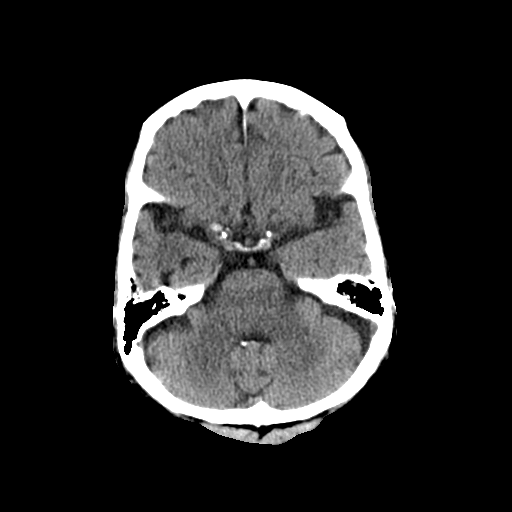
[im 9/27  brain]
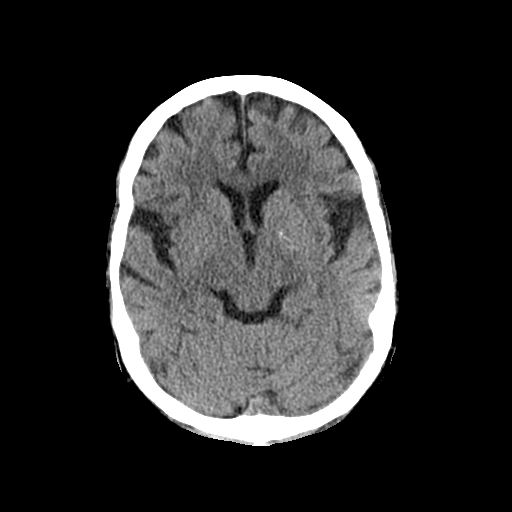
[im 12/27  brain]
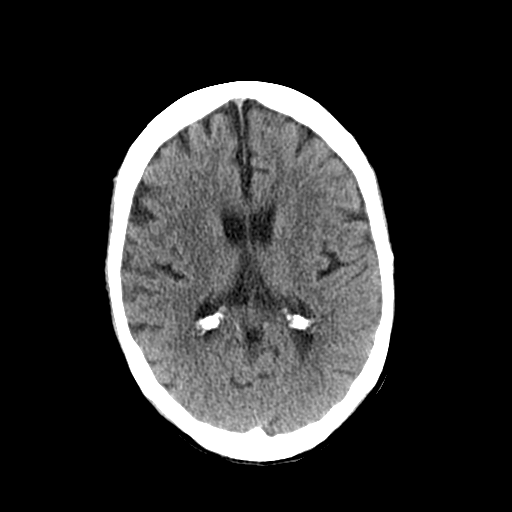
[im 16/27  brain]
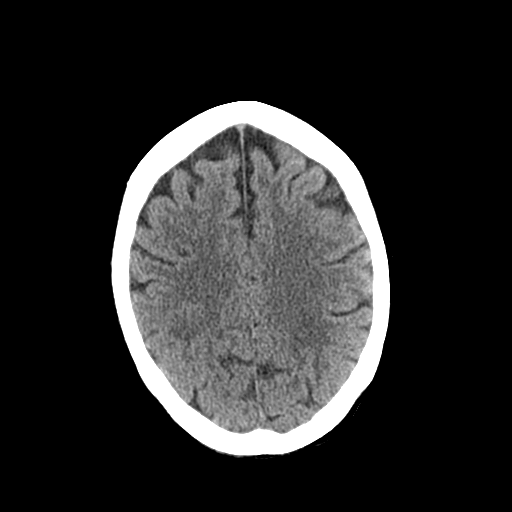
[im 16/27  bone]
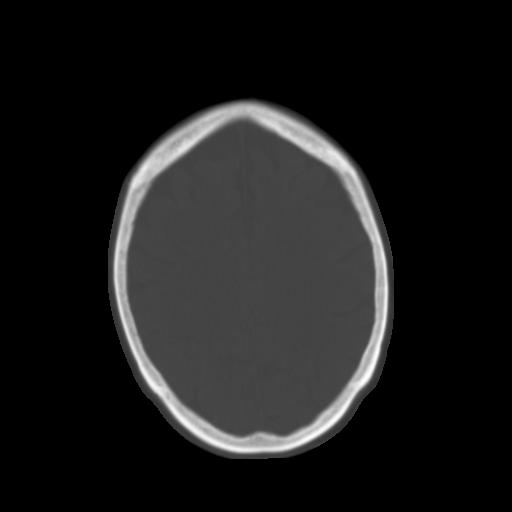
[im 19/27  brain]
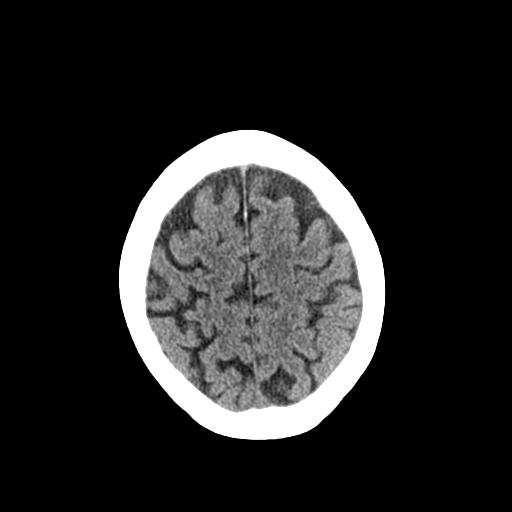
[im 22/27  brain]
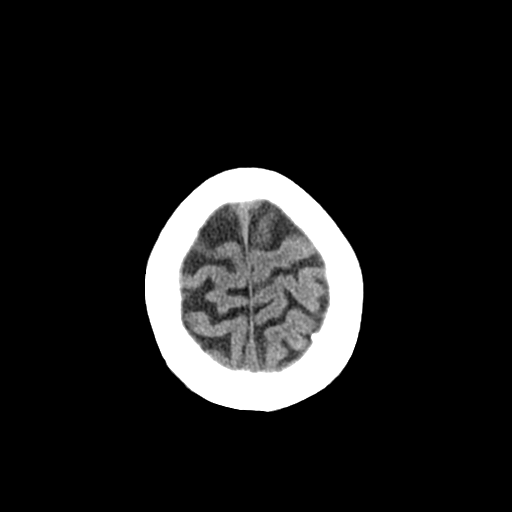
[im 25/27  brain]
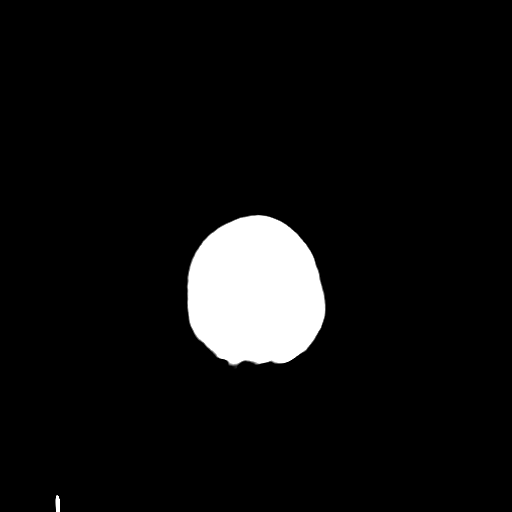

[Series 4: coronal soft tissue · coronal · 0.26mm/px · 3 of 62 slices shown]
[im 21/62  brain]
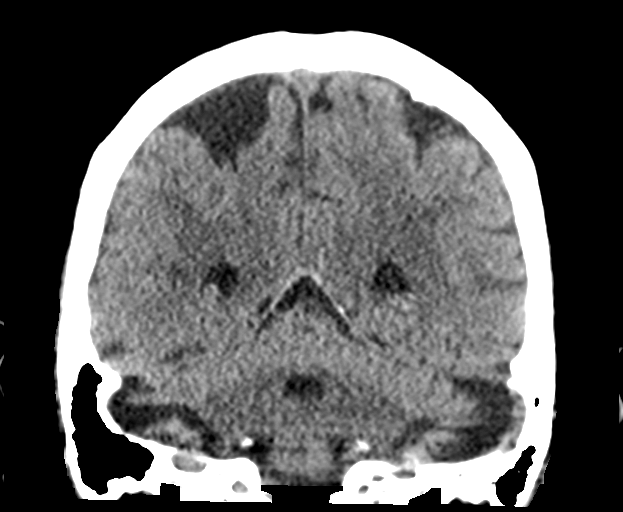
[im 28/62  brain]
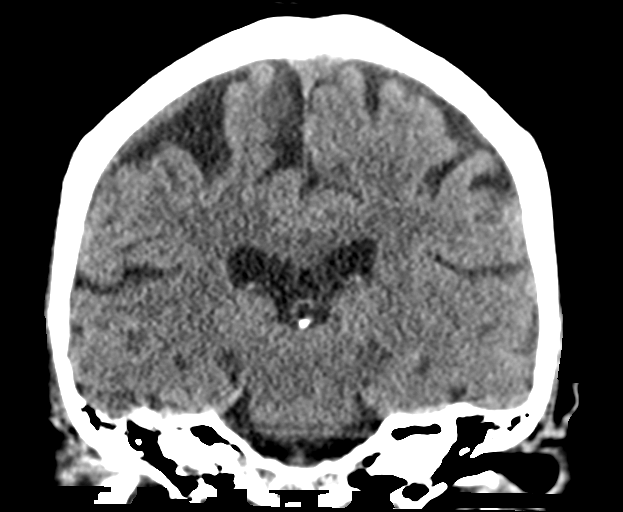
[im 34/62  brain]
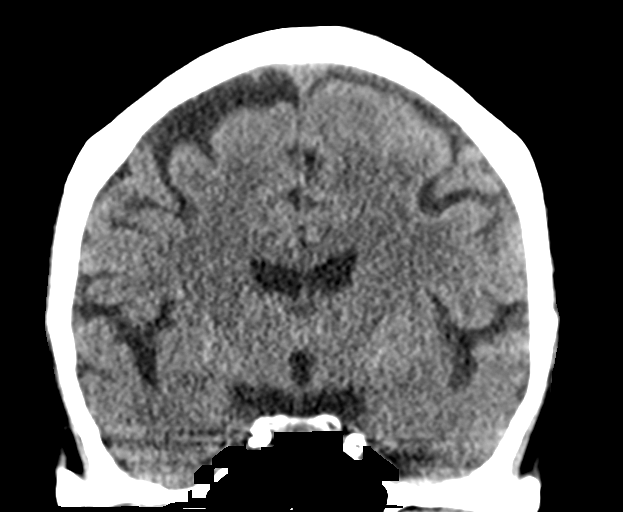

[Series 5: sagittal soft tissue · sagittal · 0.26mm/px · 3 of 52 slices shown]
[im 18/52  brain]
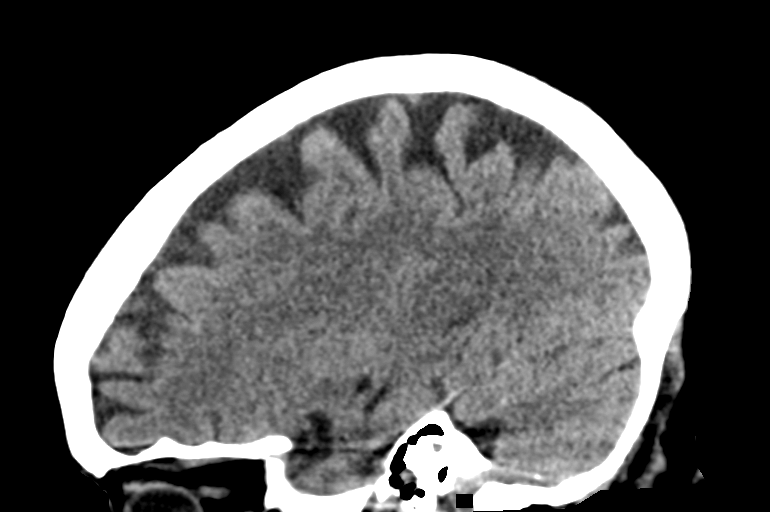
[im 26/52  brain]
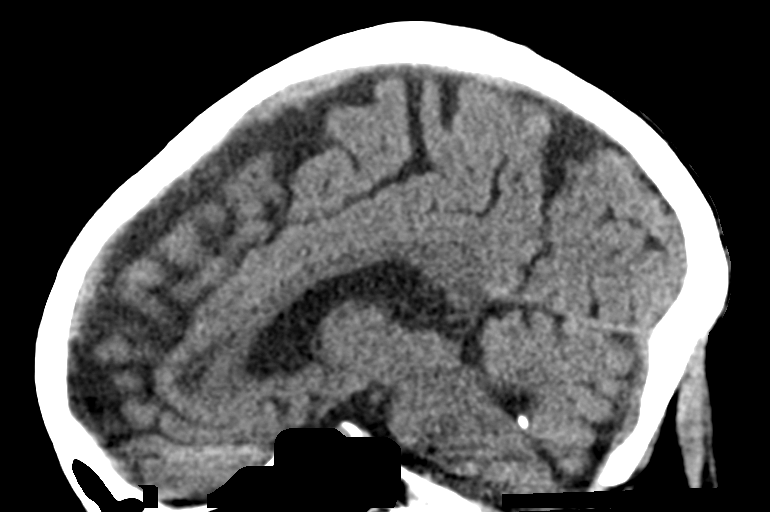
[im 35/52  brain]
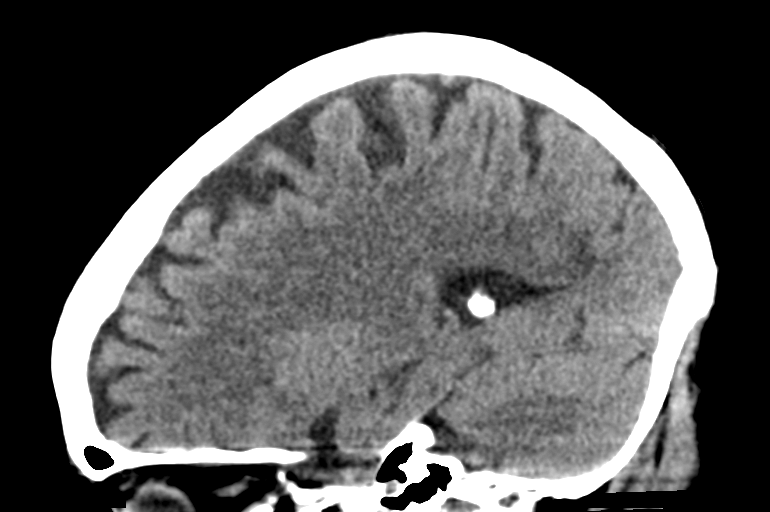

[14 of 44 positions shown; findings below may reference images not displayed]

FINDINGS: Brain: Mild generalized atrophy and white matter disease is present
bilaterally. Acute cortical infarct, hemorrhage, or mass lesion is
present. Ventricles are of normal size. No significant extra-axial
fluid collection is present.

Vascular: Atherosclerotic calcifications are present within the
cavernous internal carotid arteries and at the dural margin of both
vertebral arteries.

Skull: Calvarium is intact. No focal lytic or blastic lesions are
present.

Sinuses/Orbits: The paranasal sinuses and mastoid air cells are
clear. Globes and orbits are within normal limits.
IMPRESSION: 1. No acute intracranial abnormality.
2. Mild generalized atrophy and white matter disease is within
normal limits for age.
3. Atherosclerosis.

## 2019-08-16 IMAGING — CR DG CHEST 1V PORT
1 series · 1 of 1 positions shown · non-contrast
Comparison: None.

CLINICAL DATA: Altered mental status and sepsis.

EXAM:
PORTABLE CHEST 1 VIEW

[dg chest port 1 view]
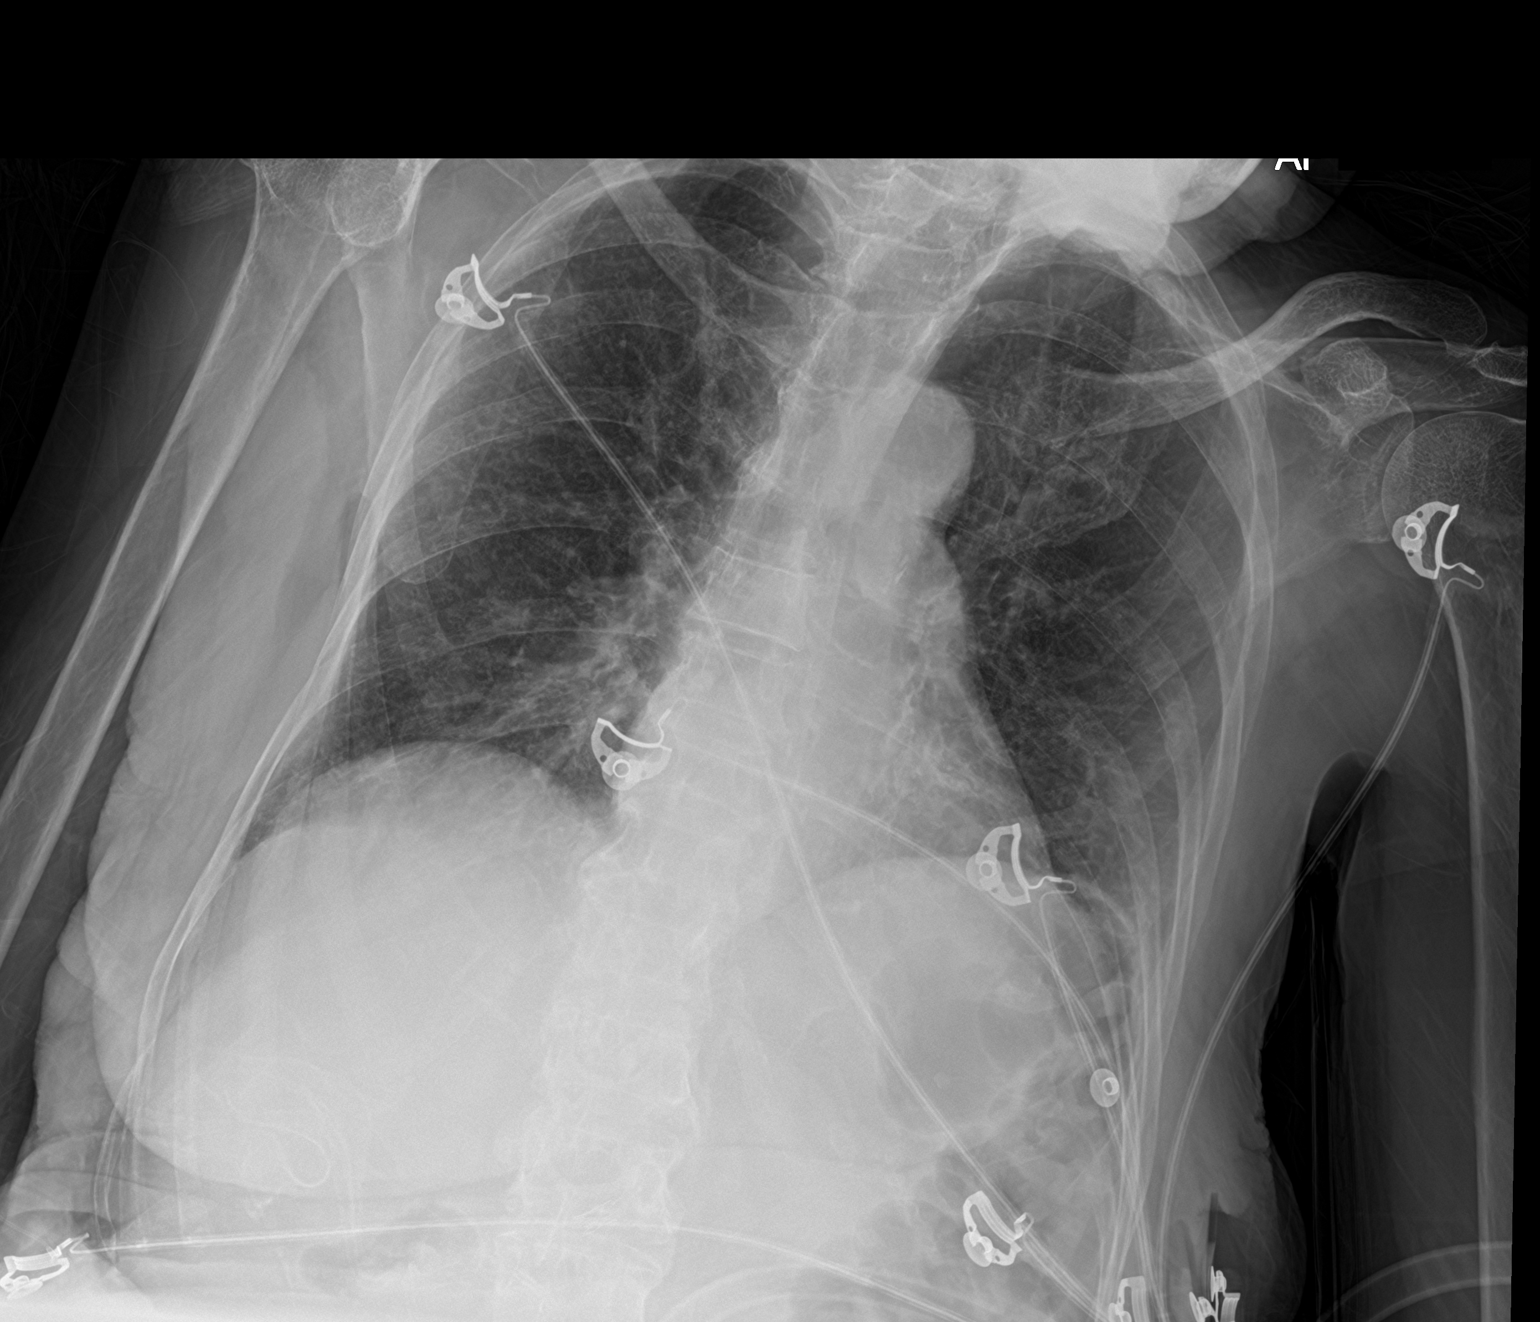

[1 of 1 positions shown; findings below may reference images not displayed]

FINDINGS: Mildly enlarged cardiac silhouette. Calcific atherosclerotic disease
of the aorta.

There is no evidence of lobar airspace consolidation, pleural
effusion or pneumothorax. Atelectasis versus scarring in the left
lung base.

Osseous structures are without acute abnormality. Soft tissues are
grossly normal.
IMPRESSION: Atelectasis versus scarring in the left lung base.

Otherwise no acute pulmonary findings.

Mildly enlarged cardiac silhouette.
